# Patient Record
Sex: Male | Born: 1975 | Race: Black or African American | Hispanic: No | Smoking: Former smoker
Health system: Southern US, Community
[De-identification: ages and names within clinical notes are randomized; demographics above are authoritative.]

## PROBLEM LIST (undated history)

## (undated) DIAGNOSIS — I10 Essential (primary) hypertension: Secondary | ICD-10-CM

## (undated) DIAGNOSIS — F319 Bipolar disorder, unspecified: Secondary | ICD-10-CM

## (undated) DIAGNOSIS — J449 Chronic obstructive pulmonary disease, unspecified: Secondary | ICD-10-CM

## (undated) DIAGNOSIS — F209 Schizophrenia, unspecified: Secondary | ICD-10-CM

## (undated) DIAGNOSIS — E785 Hyperlipidemia, unspecified: Secondary | ICD-10-CM

## (undated) DIAGNOSIS — F329 Major depressive disorder, single episode, unspecified: Secondary | ICD-10-CM

## (undated) DIAGNOSIS — Z789 Other specified health status: Secondary | ICD-10-CM

## (undated) DIAGNOSIS — F419 Anxiety disorder, unspecified: Secondary | ICD-10-CM

## (undated) DIAGNOSIS — Z9189 Other specified personal risk factors, not elsewhere classified: Secondary | ICD-10-CM

## (undated) DIAGNOSIS — F32A Depression, unspecified: Secondary | ICD-10-CM

## (undated) HISTORY — DX: Chronic obstructive pulmonary disease, unspecified: J44.9

## (undated) HISTORY — DX: Bipolar disorder, unspecified: F31.9

## (undated) HISTORY — DX: Major depressive disorder, single episode, unspecified: F32.9

## (undated) HISTORY — DX: Other specified health status: Z78.9

## (undated) HISTORY — PX: OTHER SURGICAL HISTORY: SHX169

## (undated) HISTORY — DX: Anxiety disorder, unspecified: F41.9

## (undated) HISTORY — DX: Other specified personal risk factors, not elsewhere classified: Z91.89

## (undated) HISTORY — DX: Hyperlipidemia, unspecified: E78.5

## (undated) HISTORY — DX: Schizophrenia, unspecified: F20.9

## (undated) HISTORY — DX: Depression, unspecified: F32.A

---

## 1998-05-23 ENCOUNTER — Emergency Department (HOSPITAL_COMMUNITY): Admission: EM | Admit: 1998-05-23 | Discharge: 1998-05-23 | Payer: Self-pay | Admitting: Emergency Medicine

## 1999-07-07 ENCOUNTER — Emergency Department (HOSPITAL_COMMUNITY): Admission: EM | Admit: 1999-07-07 | Discharge: 1999-07-07 | Payer: Self-pay | Admitting: Emergency Medicine

## 1999-07-10 ENCOUNTER — Ambulatory Visit: Admission: RE | Admit: 1999-07-10 | Discharge: 1999-07-10 | Payer: Self-pay | Admitting: Emergency Medicine

## 2000-01-09 ENCOUNTER — Emergency Department (HOSPITAL_COMMUNITY): Admission: EM | Admit: 2000-01-09 | Discharge: 2000-01-09 | Payer: Self-pay

## 2003-03-16 ENCOUNTER — Encounter: Payer: Self-pay | Admitting: Internal Medicine

## 2003-03-16 ENCOUNTER — Ambulatory Visit (HOSPITAL_COMMUNITY): Admission: RE | Admit: 2003-03-16 | Discharge: 2003-03-16 | Payer: Self-pay | Admitting: Internal Medicine

## 2005-03-20 ENCOUNTER — Ambulatory Visit: Payer: Self-pay | Admitting: Internal Medicine

## 2005-05-11 ENCOUNTER — Emergency Department (HOSPITAL_COMMUNITY): Admission: EM | Admit: 2005-05-11 | Discharge: 2005-05-11 | Payer: Self-pay | Admitting: Emergency Medicine

## 2005-05-14 ENCOUNTER — Ambulatory Visit: Payer: Self-pay | Admitting: Internal Medicine

## 2005-05-16 ENCOUNTER — Ambulatory Visit: Payer: Self-pay | Admitting: Internal Medicine

## 2008-01-30 ENCOUNTER — Ambulatory Visit: Payer: Self-pay | Admitting: Internal Medicine

## 2008-01-30 LAB — CONVERTED CEMR LAB

## 2009-05-26 ENCOUNTER — Emergency Department (HOSPITAL_COMMUNITY): Admission: EM | Admit: 2009-05-26 | Discharge: 2009-05-26 | Payer: Self-pay | Admitting: Emergency Medicine

## 2009-06-11 ENCOUNTER — Emergency Department (HOSPITAL_COMMUNITY): Admission: EM | Admit: 2009-06-11 | Discharge: 2009-06-11 | Payer: Self-pay | Admitting: Emergency Medicine

## 2010-04-17 ENCOUNTER — Emergency Department (HOSPITAL_COMMUNITY): Admission: EM | Admit: 2010-04-17 | Discharge: 2010-04-17 | Payer: Self-pay | Admitting: Emergency Medicine

## 2010-09-03 ENCOUNTER — Emergency Department (HOSPITAL_COMMUNITY)
Admission: EM | Admit: 2010-09-03 | Discharge: 2010-09-07 | Payer: Self-pay | Source: Home / Self Care | Admitting: Emergency Medicine

## 2010-09-04 LAB — COMPREHENSIVE METABOLIC PANEL
ALT: 23 U/L (ref 0–53)
AST: 26 U/L (ref 0–37)
Albumin: 3.6 g/dL (ref 3.5–5.2)
Alkaline Phosphatase: 99 U/L (ref 39–117)
BUN: 8 mg/dL (ref 6–23)
CO2: 25 mEq/L (ref 19–32)
Calcium: 9.1 mg/dL (ref 8.4–10.5)
Chloride: 109 mEq/L (ref 96–112)
Creatinine, Ser: 1.24 mg/dL (ref 0.4–1.5)
GFR calc Af Amer: >60 mL/min (ref >60)
GFR calc non Af Amer: >60 mL/min (ref >60)
Glucose, Bld: 98 mg/dL (ref 70–99)
Potassium: 3.8 mEq/L (ref 3.5–5.1)
Sodium: 140 mEq/L (ref 135–145)
Total Bilirubin: 1.5 mg/dL — ABNORMAL HIGH (ref 0.3–1.2)
Total Protein: 7.1 g/dL (ref 6.0–8.3)

## 2010-09-04 LAB — LIPASE, BLOOD: Lipase: 27 U/L (ref 11–59)

## 2010-09-04 LAB — RAPID URINE DRUG SCREEN, HOSP PERFORMED
Amphetamines: NOT DETECTED
Barbiturates: NOT DETECTED
Benzodiazepines: NOT DETECTED
Cocaine: NOT DETECTED
Opiates: NOT DETECTED
Tetrahydrocannabinol: POSITIVE — AB

## 2010-09-04 LAB — CBC
HCT: 44.4 % (ref 39.0–52.0)
Hemoglobin: 15.3 g/dL (ref 13.0–17.0)
MCH: 28.4 pg (ref 26.0–34.0)
MCHC: 34.5 g/dL (ref 30.0–36.0)
MCV: 82.5 fL (ref 78.0–100.0)
Platelets: 202 10*3/uL (ref 150–400)
RBC: 5.38 MIL/uL (ref 4.22–5.81)
RDW: 14.2 % (ref 11.5–15.5)
WBC: 11.1 10*3/uL — ABNORMAL HIGH (ref 4.0–10.5)

## 2010-09-04 LAB — IRON: Iron: 91 ug/dL (ref 42–135)

## 2010-09-04 LAB — URINE MICROSCOPIC-ADD ON

## 2010-09-04 LAB — GLUCOSE, CAPILLARY: Glucose-Capillary: 95 mg/dL (ref 70–99)

## 2010-09-04 LAB — URINALYSIS, ROUTINE W REFLEX MICROSCOPIC
Bilirubin Urine: NEGATIVE
Ketones, ur: NEGATIVE mg/dL
Leukocytes, UA: NEGATIVE
Nitrite: NEGATIVE
Protein, ur: NEGATIVE mg/dL
Specific Gravity, Urine: 1.023 (ref 1.005–1.030)
Urine Glucose, Fasting: NEGATIVE mg/dL
Urobilinogen, UA: 1 mg/dL (ref 0.0–1.0)
pH: 6 (ref 5.0–8.0)

## 2010-09-04 LAB — DIFFERENTIAL
Basophils Absolute: 0 10*3/uL (ref 0.0–0.1)
Basophils Relative: 0 % (ref 0–1)
Eosinophils Absolute: 0.1 10*3/uL (ref 0.0–0.7)
Eosinophils Relative: 1 % (ref 0–5)
Lymphocytes Relative: 26 % (ref 12–46)
Lymphs Abs: 2.9 10*3/uL (ref 0.7–4.0)
Monocytes Absolute: 0.8 10*3/uL (ref 0.1–1.0)
Monocytes Relative: 7 % (ref 3–12)
Neutro Abs: 7.3 10*3/uL (ref 1.7–7.7)
Neutrophils Relative %: 66 % (ref 43–77)

## 2010-09-04 LAB — SALICYLATE LEVEL: Salicylate Lvl: <4 mg/dL (ref 2.8–20.0)

## 2010-09-04 LAB — ACETAMINOPHEN LEVEL: Acetaminophen (Tylenol), Serum: <10 ug/mL — ABNORMAL LOW (ref 10–30)

## 2010-09-04 LAB — ETHANOL: Alcohol, Ethyl (B): <5 mg/dL (ref 0–10)

## 2010-09-09 ENCOUNTER — Encounter: Payer: Self-pay | Admitting: Internal Medicine

## 2011-03-01 ENCOUNTER — Inpatient Hospital Stay (INDEPENDENT_AMBULATORY_CARE_PROVIDER_SITE_OTHER)
Admission: RE | Admit: 2011-03-01 | Discharge: 2011-03-01 | Disposition: A | Discharge status: Home / Self Care | Payer: Self-pay | Source: Ambulatory Visit | Attending: Family Medicine | Admitting: Family Medicine

## 2011-03-01 DIAGNOSIS — H109 Unspecified conjunctivitis: Secondary | ICD-10-CM

## 2011-05-22 ENCOUNTER — Inpatient Hospital Stay (INDEPENDENT_AMBULATORY_CARE_PROVIDER_SITE_OTHER)
Admission: RE | Admit: 2011-05-22 | Discharge: 2011-05-22 | Disposition: A | Discharge status: Home / Self Care | Payer: Self-pay | Source: Ambulatory Visit | Attending: Family Medicine | Admitting: Family Medicine

## 2011-05-22 DIAGNOSIS — R51 Headache: Secondary | ICD-10-CM

## 2011-12-12 ENCOUNTER — Emergency Department (INDEPENDENT_AMBULATORY_CARE_PROVIDER_SITE_OTHER)
Admission: EM | Admit: 2011-12-12 | Discharge: 2011-12-12 | Disposition: A | Discharge status: Home / Self Care | Payer: Self-pay | Source: Home / Self Care | Attending: Emergency Medicine | Admitting: Emergency Medicine

## 2011-12-12 ENCOUNTER — Encounter (HOSPITAL_COMMUNITY): Payer: Self-pay

## 2011-12-12 DIAGNOSIS — L02828 Furuncle of other sites: Secondary | ICD-10-CM

## 2011-12-12 DIAGNOSIS — L02429 Furuncle of limb, unspecified: Secondary | ICD-10-CM

## 2011-12-12 MED ORDER — DOXYCYCLINE HYCLATE 100 MG PO CAPS: 100.0000 mg | ORAL_CAPSULE | Freq: Two times a day (BID) | ORAL | Status: AC

## 2011-12-12 NOTE — ED Provider Notes (Signed)
History     CSN: [id number]  Arrival date & time 12/12/11  7829   First MD Initiated Contact with Patient 12/12/11 [phone]      Chief Complaint  Patient presents with  . Recurrent Skin Infections    (Consider location/radiation/quality/duration/timing/severity/associated sxs/prior treatment) HPI Comments: Started very small 7 days like a pimple and got bigger, swollen and tender, and drained some pus while  i was in the shower and started oozing pus, made feel nauseous and made me feel dizzy, its less swollen today"  The history is provided by the patient.    History reviewed. No pertinent past medical history.  History reviewed. No pertinent past surgical history.  History reviewed. No pertinent family history.  History  Substance Use Topics  . Smoking status: Not on file  . Smokeless tobacco: Not on file  . Alcohol Use: Not on file      Review of Systems  Constitutional: Negative for fever, activity change and fatigue.  Skin: Positive for wound.  Neurological: Negative for weakness and numbness.    Allergies  Review of patient's allergies indicates no known allergies.  Home Medications   Current Outpatient Rx  Name Route Sig Dispense Refill  . DOXYCYCLINE HYCLATE 100 MG PO CAPS Oral Take 1 capsule (100 mg total) by mouth 2 (two) times daily. 20 capsule 0    BP 150/88  Pulse 74  Temp(Src) 98.8 F (37.1 C) (Oral)  Resp 18  SpO2 100%  Physical Exam  Nursing note and vitals reviewed. Constitutional: He appears well-developed and well-nourished.  Musculoskeletal: He exhibits tenderness.       Arms: Skin: Rash noted.    ED Course  INCISION AND DRAINAGE Performed by: Celicia Minahan Authorized by: Jimmie Molly Consent: Verbal consent obtained. Consent given by: patient Patient understanding: patient states understanding of the procedure being performed Patient identity confirmed: verbally with patient Type: cyst Body area: upper extremity Location  details: left elbow Local anesthetic: NaHCO3 (sodium bicarbonate) Patient sedated: no Scalpel size: 15 Drainage amount: scant Wound treatment: wound left open Patient tolerance: Patient tolerated the procedure well with no immediate complications.   (including critical care time)   Labs Reviewed  CULTURE, ROUTINE-ABSCESS   No results found.   1. Furuncle of extremity       MDM  Outer aspect left arm furuncle with mild cellulitis I & D        Jimmie Molly, MD 12/12/11 [phone]

## 2011-12-12 NOTE — ED Notes (Signed)
C/o 7 day duration of pain, swelling left arm, drained some in shower this AM; c/o made him feel sick

## 2011-12-12 NOTE — Discharge Instructions (Signed)
Take antibiotic as described. Return if further concerns or area become much better up to 3 to 5 days   Abscess Care After An abscess (also called a boil or furuncle) is an infected area that contains a collection of pus. Signs and symptoms of an abscess include pain, tenderness, redness, or hardness, or you may feel a moveable soft area under your skin. An abscess can occur anywhere in the body. The infection may spread to surrounding tissues causing cellulitis. A cut (incision) by the surgeon was made over your abscess and the pus was drained out. Gauze may have been packed into the space to provide a drain that will allow the cavity to heal from the inside outwards. The boil may be painful for 5 to 7 days. Most people with a boil do not have high fevers. Your abscess, if seen early, may not have localized, and may not have been lanced. If not, another appointment may be required for this if it does not get better on its own or with medications. HOME CARE INSTRUCTIONS   Only take over-the-counter or prescription medicines for pain, discomfort, or fever as directed by your caregiver.   When you bathe, soak and then remove gauze or iodoform packs at least daily or as directed by your caregiver. You may then wash the wound gently with mild soapy water. Repack with gauze or do as your caregiver directs.  SEEK IMMEDIATE MEDICAL CARE IF:   You develop increased pain, swelling, redness, drainage, or bleeding in the wound site.   You develop signs of generalized infection including muscle aches, chills, fever, or a general ill feeling.   An oral temperature above 102 F (38.9 C) develops, not controlled by medication.  See your caregiver for a recheck if you develop any of the symptoms described above. If medications (antibiotics) were prescribed, take them as directed. Document Released: 02/22/2005 Document Revised: 07/26/2011 Document Reviewed: 10/20/2007 [facility] Patient Information 2012  [place], [place].

## 2011-12-12 NOTE — ED Notes (Signed)
Call back number for lab issues verified 

## 2011-12-15 LAB — CULTURE, ROUTINE-ABSCESS: Gram Stain: NONE SEEN

## 2011-12-15 NOTE — ED Notes (Signed)
[facility] called and can't afford doxy and septra ds # 20 one bid per Dr Artis Flock. THH

## 2011-12-17 NOTE — ED Notes (Signed)
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  Adequately treated with Septra DS (Rx called in to pharmacy post visit per notes by Dr. Artis Flock).

## 2013-08-04 ENCOUNTER — Encounter (HOSPITAL_COMMUNITY): Payer: Self-pay | Admitting: Emergency Medicine

## 2013-08-04 ENCOUNTER — Emergency Department (INDEPENDENT_AMBULATORY_CARE_PROVIDER_SITE_OTHER)
Admission: EM | Admit: 2013-08-04 | Discharge: 2013-08-04 | Disposition: A | Discharge status: Home / Self Care | Payer: Medicaid Other | Source: Home / Self Care | Attending: Family Medicine | Admitting: Family Medicine

## 2013-08-04 DIAGNOSIS — K047 Periapical abscess without sinus: Secondary | ICD-10-CM

## 2013-08-04 DIAGNOSIS — B351 Tinea unguium: Secondary | ICD-10-CM

## 2013-08-04 DIAGNOSIS — B353 Tinea pedis: Secondary | ICD-10-CM

## 2013-08-04 MED ORDER — CLINDAMYCIN HCL 300 MG PO CAPS: 300.0000 mg | ORAL_CAPSULE | Freq: Three times a day (TID) | ORAL | Status: DC

## 2013-08-04 MED ORDER — TERBINAFINE HCL 250 MG PO TABS: 250.0000 mg | ORAL_TABLET | Freq: Every day | ORAL | Status: AC

## 2013-08-04 NOTE — ED Provider Notes (Signed)
Bruce [name] is a 37 y.o. male who presents to Urgent Care today for rash and mouth lesion.   # Rash - He reports feet have intermittently had tiny blisters with clear exudate for "years." He notes some hyperpigmented scars on his feet and discolored toenails. He has had worsening itching the past 2 days. His girlfriend is worried about it and asked him to come in. He denies fevers or chills. No one in the household has a similar rash. Denies new shoes, products, medicines, or lotions but reports that whenever he tries on other people's shoes, he breaks out.  # Mouth lesion - 5 days ago, patient developed a mouth lesion that was painless but started oozing pus that tasted funny. He looked in his mouth and saw a knot at the roof of mouth leaking pus and blood. He has also been having diarrhea and slight nausea for 5 days and chills. He denies fevers or other rash. He does not report other symptoms. He thinks his friend's girlfriend has had an ear infection recently.  PMH: No PCP Reports no major medical issues Up to date on childhood immunizations. Reports no regular medications. NKDA   History reviewed. No pertinent past medical history. History  Substance Use Topics  . Smoking status: Current Every Day Smoker -- 1.00 packs/day    Types: Cigarettes  . Smokeless tobacco: Not on file  . Alcohol Use: Not on file  Smokes 1 ppd x 17 years. Reports no drugs. EtOh once monthly  ROS as above Medications reviewed. No current facility-administered medications for this encounter.   No current outpatient prescriptions on file.    Exam:  BP 129/75  Pulse 78  Temp(Src) 98.5 F (36.9 C) (Oral)  Resp 16  SpO2 98% Gen: Well NAD, pleasant HEENT: EOMI,  MMM, sclera clear, o/p clear with very poor dentition, upper back molars decayed to gumline with no surrounding erythema, roof of mouth at border with upper front teeth with 3cm almost drained abscess with minimal purulent drainage with  massage, no tenderness or erythema Skin: Feet with slight amount of peeling between toes, mild dryness and peeling bilateral lateral foot, no erythema Lungs: Normal work of breathing.   No results found for this or any previous visit (from the past 24 hour(s)). No results found.  Assessment and Plan: 37 y.o. male with abscess at roof of mouth and likely tinea pedis, possibly vesiculobullous form.  # Abscess roof of mouth - Patient is afebrile and abscess already draining. - Clindamycin TID x 10 days with 1 refill if symptoms not improving. - Warm salt water and compression - Strongly encouraged follow up with dentist, provided list covered by Medicaid - Return precautions reviewed.  # Tinea pedis, possible vesicobullous form - Lamisil PO 250mg  daily x 2 weeks for tinea pedis which may also help onychomycosis. - F/u with PCP for further tx - Info on foot centers given    Leona Singleton, MD 08/04/13 [phone]

## 2013-08-04 NOTE — ED Provider Notes (Signed)
Medical screening examination/treatment/procedure(s) were performed by resident physician or non-physician practitioner and as supervising physician I was immediately available for consultation/collaboration.   Kyannah Climer DOUGLAS MD.   Ren Aspinall D Makenzy Krist, MD 08/04/13 2013 

## 2013-08-04 NOTE — ED Notes (Signed)
Pt c/o intermittent rash on both feet onset 1.5 yrs... States they blister up and pop, draining clear fluid... They will leave a dark discoloration after popping Also c/o an abscess roof of mouth/palette onset 1 week... Draining blood and white matter???... Has also been feeling nauseas and having diarrhea Denies: f/v... He is alert w/no signs of acute distress.

## 2014-06-02 ENCOUNTER — Emergency Department (HOSPITAL_COMMUNITY)
Admission: EM | Admit: 2014-06-02 | Discharge: 2014-06-02 | Disposition: A | Discharge status: Home / Self Care | Payer: Medicaid Other | Attending: Emergency Medicine | Admitting: Emergency Medicine

## 2014-06-02 ENCOUNTER — Encounter (HOSPITAL_COMMUNITY): Payer: Self-pay | Admitting: Emergency Medicine

## 2014-06-02 DIAGNOSIS — Z792 Long term (current) use of antibiotics: Secondary | ICD-10-CM | POA: Insufficient documentation

## 2014-06-02 DIAGNOSIS — K029 Dental caries, unspecified: Secondary | ICD-10-CM | POA: Diagnosis not present

## 2014-06-02 DIAGNOSIS — Z720 Tobacco use: Secondary | ICD-10-CM | POA: Diagnosis not present

## 2014-06-02 DIAGNOSIS — K088 Other specified disorders of teeth and supporting structures: Secondary | ICD-10-CM | POA: Diagnosis present

## 2014-06-02 DIAGNOSIS — K047 Periapical abscess without sinus: Secondary | ICD-10-CM | POA: Diagnosis not present

## 2014-06-02 MED ORDER — HYDROCODONE-ACETAMINOPHEN 5-325 MG PO TABS: 1.0000 | ORAL_TABLET | Freq: Four times a day (QID) | ORAL | Status: DC | PRN

## 2014-06-02 MED ORDER — PENICILLIN V POTASSIUM 500 MG PO TABS: 500.0000 mg | ORAL_TABLET | Freq: Four times a day (QID) | ORAL | Status: DC

## 2014-06-02 MED ORDER — IBUPROFEN 800 MG PO TABS: 800.0000 mg | ORAL_TABLET | Freq: Three times a day (TID) | ORAL | Status: DC | PRN

## 2014-06-02 NOTE — ED Notes (Signed)
Pt reports having a knot on top of mouth, causing pain to lip and front teeth. Airway intact.

## 2014-06-02 NOTE — Discharge Instructions (Signed)
Return here as needed. Follow up with the dentist provided. °

## 2014-06-02 NOTE — ED Provider Notes (Signed)
CSN: [id number]     Arrival date & time 06/02/14  1226 History  This chart was scribed for non-physician practitioner, Ebbie Ridgehris Helem Reesor, PA-C working with Elwin MochaBlair Walden, MD by Greggory StallionKayla Andersen, ED scribe. This patient was seen in room TR05C/TR05C and the patient's care was started at 1:21 PM.    Chief Complaint  Patient presents with  . Dental Pain   The history is provided by the patient. No language interpreter was used.   HPI Comments: Bruce [name] is a 38 y.o. male who presents to the Emergency Department complaining of right front dental pain that started about one month ago. States he started noticing a knot to the roof of his mouth. Reports some drainage from the area. Pt does not have a dentist.   History reviewed. No pertinent past medical history. History reviewed. No pertinent past surgical history. History reviewed. No pertinent family history. History  Substance Use Topics  . Smoking status: Current Every Day Smoker -- 1.00 packs/day    Types: Cigarettes  . Smokeless tobacco: Not on file  . Alcohol Use: Not on file    Review of Systems  All other systems negative except as documented in the HPI. All pertinent positives and negatives as reviewed in the HPI.  Allergies  Review of patient's allergies indicates no known allergies.  Home Medications   Prior to Admission medications   Medication Sig Start Date End Date Taking? Authorizing Provider  clindamycin (CLEOCIN) 300 MG capsule Take 1 capsule (300 mg total) by mouth 3 (three) times daily. 08/04/13   Leona SingletonMaria T Thekkekandam, MD   BP 138/76  Pulse 78  Temp(Src) 98.2 F (36.8 C) (Oral)  Resp 18  Ht 6\' 5"  (1.956 m)  Wt 240 lb (108.863 kg)  BMI 28.45 kg/m2  SpO2 98%  Physical Exam  Nursing note and vitals reviewed. Constitutional: He is oriented to person, place, and time. He appears well-developed and well-nourished. No distress.  HENT:  Head: Normocephalic and atraumatic.  Multiple areas of dental decay. Swollen  area to soft palate medial to right upper lateral incisor.   Eyes: Conjunctivae and EOM are normal.  Neck: Neck supple. No tracheal deviation present.  Cardiovascular: Normal rate.   Pulmonary/Chest: Effort normal. No respiratory distress.  Musculoskeletal: Normal range of motion.  Neurological: He is alert and oriented to person, place, and time.  Skin: Skin is warm and dry.  Psychiatric: He has a normal mood and affect. His behavior is normal.    ED Course  Procedures (including critical care time)  DIAGNOSTIC STUDIES: Oxygen Saturation is 98% on RA, normal by my interpretation.    COORDINATION OF CARE: 1:22 PM-Discussed treatment plan which includes an antibiotic and warm water gargles with pt at bedside and pt agreed to plan. Will give pt dental referrals and advised him to follow up.     I personally performed the services described in this documentation, which was scribed in my presence. The recorded information has been reviewed and is accurate.  Carlyle Dollyhristopher W Gennie Dib, PA-C 06/03/14 1552

## 2014-06-02 NOTE — ED Notes (Signed)
C/O right upper gum pain. Hard pallet red, swollen and painful. Onset 1 month ago.

## 2014-06-04 NOTE — ED Provider Notes (Signed)
Medical screening examination/treatment/procedure(s) were performed by non-physician practitioner and as supervising physician I was immediately available for consultation/collaboration.   EKG Interpretation None        Bruce MochaBlair Metztli Sachdev, MD 06/04/14 [phone]

## 2015-12-17 ENCOUNTER — Emergency Department (HOSPITAL_COMMUNITY)
Admission: EM | Admit: 2015-12-17 | Discharge: 2015-12-17 | Disposition: A | Discharge status: Home / Self Care | Payer: Medicaid Other | Attending: Emergency Medicine | Admitting: Emergency Medicine

## 2015-12-17 ENCOUNTER — Encounter (HOSPITAL_COMMUNITY): Payer: Self-pay

## 2015-12-17 DIAGNOSIS — Z87891 Personal history of nicotine dependence: Secondary | ICD-10-CM | POA: Diagnosis not present

## 2015-12-17 DIAGNOSIS — G5601 Carpal tunnel syndrome, right upper limb: Secondary | ICD-10-CM

## 2015-12-17 DIAGNOSIS — M5431 Sciatica, right side: Secondary | ICD-10-CM | POA: Insufficient documentation

## 2015-12-17 DIAGNOSIS — R200 Anesthesia of skin: Secondary | ICD-10-CM | POA: Diagnosis present

## 2015-12-17 DIAGNOSIS — F1210 Cannabis abuse, uncomplicated: Secondary | ICD-10-CM | POA: Insufficient documentation

## 2015-12-17 LAB — CBC WITH DIFFERENTIAL/PLATELET
BASOS ABS: 0 10*3/uL (ref 0.0–0.1)
Basophils Relative: 0 %
Eosinophils Absolute: 0.1 10*3/uL (ref 0.0–0.7)
Eosinophils Relative: 2 %
HEMATOCRIT: 43.1 % (ref 39.0–52.0)
HEMOGLOBIN: 14.3 g/dL (ref 13.0–17.0)
LYMPHS ABS: 4.1 10*3/uL — AB (ref 0.7–4.0)
LYMPHS PCT: 50 %
MCH: 27.9 pg (ref 26.0–34.0)
MCHC: 33.2 g/dL (ref 30.0–36.0)
MCV: 84.2 fL (ref 78.0–100.0)
Monocytes Absolute: 0.7 10*3/uL (ref 0.1–1.0)
Monocytes Relative: 8 %
NEUTROS ABS: 3.3 10*3/uL (ref 1.7–7.7)
NEUTROS PCT: 40 %
PLATELETS: 235 10*3/uL (ref 150–400)
RBC: 5.12 MIL/uL (ref 4.22–5.81)
RDW: 14.2 % (ref 11.5–15.5)
WBC: 8.2 10*3/uL (ref 4.0–10.5)

## 2015-12-17 LAB — COMPREHENSIVE METABOLIC PANEL
ALT: 36 U/L (ref 17–63)
AST: 37 U/L (ref 15–41)
Albumin: 3.5 g/dL (ref 3.5–5.0)
Alkaline Phosphatase: 87 U/L (ref 38–126)
Anion gap: 10 (ref 5–15)
BILIRUBIN TOTAL: 1.2 mg/dL (ref 0.3–1.2)
BUN: 10 mg/dL (ref 6–20)
CALCIUM: 8.8 mg/dL — AB (ref 8.9–10.3)
CHLORIDE: 104 mmol/L (ref 101–111)
CO2: 21 mmol/L — ABNORMAL LOW (ref 22–32)
CREATININE: 1.31 mg/dL — AB (ref 0.61–1.24)
GFR calc Af Amer: >60 mL/min (ref >60)
Glucose, Bld: 98 mg/dL (ref 65–99)
Potassium: 4.3 mmol/L (ref 3.5–5.1)
Sodium: 135 mmol/L (ref 135–145)
Total Protein: 6.8 g/dL (ref 6.5–8.1)

## 2015-12-17 LAB — RAPID URINE DRUG SCREEN, HOSP PERFORMED
Amphetamines: NOT DETECTED
Barbiturates: NOT DETECTED
Benzodiazepines: NOT DETECTED
Cocaine: NOT DETECTED
OPIATES: NOT DETECTED
TETRAHYDROCANNABINOL: POSITIVE — AB

## 2015-12-17 MED ORDER — NAPROXEN 500 MG PO TABS: 500.0000 mg | ORAL_TABLET | Freq: Two times a day (BID) | ORAL | Status: DC

## 2015-12-17 MED ORDER — KETOROLAC TROMETHAMINE 30 MG/ML IJ SOLN
30.0000 mg | Freq: Once | INTRAMUSCULAR | Status: AC
  Administered 2015-12-17: 30 mg via INTRAMUSCULAR
  Filled 2015-12-17: qty 1

## 2015-12-17 MED ORDER — IBUPROFEN 800 MG PO TABS
800.0000 mg | ORAL_TABLET | Freq: Once | ORAL | Status: AC
  Administered 2015-12-17: 800 mg via ORAL
  Filled 2015-12-17: qty 1

## 2015-12-17 MED ORDER — DEXAMETHASONE SODIUM PHOSPHATE 10 MG/ML IJ SOLN
10.0000 mg | Freq: Once | INTRAMUSCULAR | Status: AC
  Administered 2015-12-17: 10 mg via INTRAMUSCULAR
  Filled 2015-12-17: qty 1

## 2015-12-17 NOTE — ED Notes (Signed)
Ortho device needed not in [employer] room; paged ortho tech and he will bring to room.

## 2015-12-17 NOTE — Discharge Instructions (Signed)
Carpal Tunnel Syndrome Carpal tunnel syndrome is a condition that causes pain in your hand and arm. The carpal tunnel is a narrow area located on the palm side of your wrist. Repeated wrist motion or certain diseases may cause swelling within the tunnel. This swelling pinches the main nerve in the wrist (median nerve). CAUSES  This condition may be caused by:   Repeated wrist motions.  Wrist injuries.  Arthritis.  A cyst or tumor in the carpal tunnel.  Fluid buildup during pregnancy. Sometimes the cause of this condition is not known.  RISK FACTORS This condition is more likely to develop in:   People who have jobs that cause them to repeatedly move their wrists in the same motion, such as butchers and cashiers.  Women.  People with certain conditions, such as:  Diabetes.  Obesity.  An underactive thyroid (hypothyroidism).  Kidney failure. SYMPTOMS  Symptoms of this condition include:   A tingling feeling in your fingers, especially in your thumb, index, and middle fingers.  Tingling or numbness in your hand.  An aching feeling in your entire arm, especially when your wrist and elbow are bent for long periods of time.  Wrist pain that goes up your arm to your shoulder.  Pain that goes down into your palm or fingers.  A weak feeling in your hands. You may have trouble grabbing and holding items. Your symptoms may feel worse during the night.  DIAGNOSIS  This condition is diagnosed with a medical history and physical exam. You may also have tests, including:   An electromyogram (EMG). This test measures electrical signals sent by your nerves into the muscles.  X-rays. TREATMENT  Treatment for this condition includes:  Lifestyle changes. It is important to stop doing or modify the activity that caused your condition.  Physical or occupational therapy.  Medicines for pain and inflammation. This may include medicine that is injected into your wrist.  A wrist  splint.  Surgery. HOME CARE INSTRUCTIONS  If You Have a Splint:  Wear it as told by your health care provider. Remove it only as told by your health care provider.  Loosen the splint if your fingers become numb and tingle, or if they turn cold and blue.  Keep the splint clean and dry. General Instructions  Take over-the-counter and prescription medicines only as told by your health care provider.  Rest your wrist from any activity that may be causing your pain. If your condition is work related, talk to your employer about changes that can be made, such as getting a wrist pad to use while typing.  If directed, apply ice to the painful area:  Put ice in a plastic bag.  Place a towel between your skin and the bag.  Leave the ice on for 20 minutes, 2-3 times per day.  Keep all follow-up visits as told by your health care provider. This is important.  Do any exercises as told by your health care provider, physical therapist, or occupational therapist. SEEK MEDICAL CARE IF:   You have new symptoms.  Your pain is not controlled with medicines.  Your symptoms get worse.   This information is not intended to replace advice given to you by your health care provider. Make sure you discuss any questions you have with your health care provider.   Document Released: 08/03/2000 Document Revised: 04/27/2015 Document Reviewed: 12/22/2014 Elsevier Interactive Patient Education 2016 [employer] Inc[employer]  Sciatica Sciatica is pain, weakness, numbness, or tingling along the path of  the sciatic nerve. The nerve starts in the lower back and runs down the back of each leg. The nerve controls the muscles in the lower leg and in the back of the knee, while also providing sensation to the back of the thigh, lower leg, and the sole of your foot. Sciatica is a symptom of another medical condition. For instance, nerve damage or certain conditions, such as a herniated disk or bone spur on the spine, pinch or  put pressure on the sciatic nerve. This causes the pain, weakness, or other sensations normally associated with sciatica. Generally, sciatica only affects one side of the body. CAUSES   Herniated or slipped disc.  Degenerative disk disease.  A pain disorder involving the narrow muscle in the buttocks (piriformis syndrome).  Pelvic injury or fracture.  Pregnancy.  Tumor (rare). SYMPTOMS  Symptoms can vary from mild to very severe. The symptoms usually travel from the low back to the buttocks and down the back of the leg. Symptoms can include:  Mild tingling or dull aches in the lower back, leg, or hip.  Numbness in the back of the calf or sole of the foot.  Burning sensations in the lower back, leg, or hip.  Sharp pains in the lower back, leg, or hip.  Leg weakness.  Severe back pain inhibiting movement. These symptoms may get worse with coughing, sneezing, laughing, or prolonged sitting or standing. Also, being overweight may worsen symptoms. DIAGNOSIS  Your caregiver will perform a physical exam to look for common symptoms of sciatica. He or she may ask you to do certain movements or activities that would trigger sciatic nerve pain. Other tests may be performed to find the cause of the sciatica. These may include:  Blood tests.  X-rays.  Imaging tests, such as an MRI or CT scan. TREATMENT  Treatment is directed at the cause of the sciatic pain. Sometimes, treatment is not necessary and the pain and discomfort goes away on its own. If treatment is needed, your caregiver may suggest:  Over-the-counter medicines to relieve pain.  Prescription medicines, such as anti-inflammatory medicine, muscle relaxants, or narcotics.  Applying heat or ice to the painful area.  Steroid injections to lessen pain, irritation, and inflammation around the nerve.  Reducing activity during periods of pain.  Exercising and stretching to strengthen your abdomen and improve flexibility of  your spine. Your caregiver may suggest losing weight if the extra weight makes the back pain worse.  Physical therapy.  Surgery to eliminate what is pressing or pinching the nerve, such as a bone spur or part of a herniated disk. HOME CARE INSTRUCTIONS   Only take over-the-counter or prescription medicines for pain or discomfort as directed by your caregiver.  Apply ice to the affected area for 20 minutes, 3-4 times a day for the first 48-72 hours. Then try heat in the same way.  Exercise, stretch, or perform your usual activities if these do not aggravate your pain.  Attend physical therapy sessions as directed by your caregiver.  Keep all follow-up appointments as directed by your caregiver.  Do not wear high heels or shoes that do not provide proper support.  Check your mattress to see if it is too soft. A firm mattress may lessen your pain and discomfort. SEEK IMMEDIATE MEDICAL CARE IF:   You lose control of your bowel or bladder (incontinence).  You have increasing weakness in the lower back, pelvis, buttocks, or legs.  You have redness or swelling of your back.  You have a burning sensation when you urinate.  You have pain that gets worse when you lie down or awakens you at night.  Your pain is worse than you have experienced in the past.  Your pain is lasting longer than 4 weeks.  You are suddenly losing weight without reason. MAKE SURE YOU:  Understand these instructions.  Will watch your condition.  Will get help right away if you are not doing well or get worse.   This information is not intended to replace advice given to you by your health care provider. Make sure you discuss any questions you have with your health care provider.   Document Released: 07/31/2001 Document Revised: 04/27/2015 Document Reviewed: 12/16/2011 Elsevier Interactive Patient Education [employer]

## 2015-12-17 NOTE — ED Notes (Signed)
Patient hre with right arm and leg numbness intermittently x  Month. Denies trauma

## 2015-12-17 NOTE — Progress Notes (Signed)
Orthopedic Tech Progress Note Patient Details:  Bruce RuedRaymond [name] [birth year] [id number]  Ortho Devices Type of Ortho Device: Thumb velcro splint Ortho Device/Splint Location: rue Ortho Device/Splint Interventions: Application   Nikki DomCrawford, Leshawn Houseworth 12/17/2015, 12:49 PM

## 2015-12-17 NOTE — ED Provider Notes (Signed)
CSN: [id number]     Arrival date & time 12/17/15  [id number] History   First MD Initiated Contact with Patient 12/17/15 1046     Chief Complaint  Patient presents with  . arm/leg numbness      (Consider location/radiation/quality/duration/timing/severity/associated sxs/prior Treatment) HPI  Bruce [name] is a 40 y.o. male with no significant PMH significant who presents with right hand pain and right leg pain x 1 month.  Patient reports intermittent, worse at night and while driving, right wrist/hand pain he describes as "pins and needles" that occasionally radiates to the right forearm.  He states he feels like he just need to shake it out.  It lasts a couple of hours.  No relieving factors.  He has tried tylenol with minimal relief.  He also reports posterior right leg pain x 1 month.  It does not radiate to the calf or toes.  No leg swelling or color change.  No bowel/bladder incontinence, fever, abdominal pain, N/V/D, urinary symptoms, CP, or SOB.  He is ambulatory without difficulty.  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  Denies injury/trauma.  No IVDU.  No active malignancy.  He denies any known medical problems.    History reviewed. No pertinent past medical history. History reviewed. No pertinent past surgical history. No family history on file. Social History  Substance Use Topics  . Smoking status: Former Smoker -- 1.00 packs/day    Types: Cigarettes  . Smokeless tobacco: None  . Alcohol Use: None    Review of Systems All other systems negative unless otherwise stated in HPI   Allergies  Review of patient's allergies indicates no known allergies.  Home Medications   Prior to Admission medications   Medication Sig Start Date End Date Taking? Authorizing Provider  acetaminophen (TYLENOL) 500 MG tablet Take 500 mg by  mouth every 6 (six) hours as needed (pain).   Yes Historical Provider, MD   BP 127/77 mmHg  Pulse 68  Temp(Src) 98.1 F (36.7 C) (Oral)  Resp 14  SpO2 100% Physical Exam  Constitutional: He is oriented to person, place, and time. He appears well-developed and well-nourished.  Non-toxic appearance. He does not have a sickly appearance. He does not appear ill.  HENT:  Head: Normocephalic and atraumatic.  Mouth/Throat: Oropharynx is clear and moist.  Eyes: Conjunctivae are normal.  Neck: Normal range of motion. Neck supple.  No cervical midline tenderness.   Cardiovascular: Normal rate, regular rhythm, normal heart sounds and intact distal pulses.   No murmur heard. Pulses:      Radial pulses are 2+ on the right side, and 2+ on the left side.       Dorsalis pedis pulses are 2+ on the right side, and 2+ on the left side.  Capillary refill less than 3 seconds x 5 in RUE.   Pulmonary/Chest: Effort normal and breath sounds normal. No accessory muscle usage or stridor. No respiratory distress. He has no wheezes. He has no rhonchi. He has no rales.  Abdominal: Soft. Bowel sounds are normal. He exhibits no distension. There is no tenderness. There is no rebound and no guarding.  Musculoskeletal: Normal range of motion. He exhibits tenderness.       Right wrist: He exhibits normal range of motion, no tenderness, no bony tenderness, no swelling, no crepitus and no deformity.  Lumbar back: He exhibits tenderness and pain. He exhibits normal range of motion, no bony tenderness, no swelling, no deformity, no spasm and normal pulse.       Back:  No spinous process tenderness.  No step offs. No crepitus.  No c/t/l/s midline tenderness.  TTP along sciatic notch with reproducible pain.   Right wrist: no obvious deformity, swelling, erythema, or warmth.  Non-tender TTP.  FAROM in flexion and extension without pain.  Positive Tinel sign. Sensation intact to light touch.   Lymphadenopathy:    He has  no cervical adenopathy.  Neurological: He is alert and oriented to person, place, and time.  Strength and sensation intact bilaterally throughout upper and lower extremities.  Gait normal. No foot drop.  + SLR on the right  Skin: Skin is warm and dry.  No erythema, induration, fluctuance, or warmth.    Psychiatric: He has a normal mood and affect. His behavior is normal.    ED Course  Procedures (including critical care time) Labs Review Labs Reviewed  CBC WITH DIFFERENTIAL/PLATELET - Abnormal; Notable for the following:    Lymphs Abs 4.1 (*)    All other components within normal limits  COMPREHENSIVE METABOLIC PANEL - Abnormal; Notable for the following:    CO2 21 (*)    Creatinine, Ser 1.31 (*)    Calcium 8.8 (*)    All other components within normal limits  URINE RAPID DRUG SCREEN, HOSP PERFORMED - Abnormal; Notable for the following:    Tetrahydrocannabinol POSITIVE (*)    All other components within normal limits    Imaging Review No results found. I have personally reviewed and evaluated these images and lab results as part of my medical decision-making.   EKG Interpretation   Date/Time:  Saturday December 17 2015 10:20:20 EDT Ventricular Rate:  73 PR Interval:  199 QRS Duration: 74 QT Interval:  376 QTC Calculation: 414 R Axis:   79 Text Interpretation:  Sinus rhythm Minimal ST elevation, lateral leads No  significant change was found Confirmed by Manus Gunning  MD, STEPHEN ([id number]) on  12/17/2015 10:39:47 AM      MDM   Final diagnoses:  Carpal tunnel syndrome of right wrist  Sciatica of right side   Patient presents with 2 separate complaints today. Patient complains of right hand/wrist pain he describes as "pins and needles "that is worse at night and while driving. No weakness. On exam, neurovascularly intact. No focal neurological deficits. No overlying signs of infection. Patient also complains of posterior right upper leg pain. He is ambulatory. No focal  weakness. No bowel or bladder incontinence, fever, abdominal pain, urinary symptoms, history of IV drug use, or active malignancy. On exam, pain is reproducible to palpation along sciatic notch. Positive straight leg raise on the right. Neurovascularly intact. No focal neurological deficits. Patient ambulatory without difficulty. Labs show UDS positive for UDS.  Cr 1.3, comparison 1 year ago 1.24.  His symptoms are consistent with carpal tunnel syndrome and sciatica.  Doubt CVA or intracranial etiology. Patient given Decadron and Toradol in ED with improvement of symptoms.  Plan to discharge home with wrist splint and Naproxen.  Follow up with orthopedics.  Discussed return precautions.  Patient agrees and acknowledges the above plan for discharge.   Case has been discussed with and seen by Dr. Manus Gunning who agrees with the above plan for discharge.     Cheri Fowler, PA-C 12/17/15 1430  Glynn Octave, MD 12/17/15 [phone]

## 2016-01-03 ENCOUNTER — Emergency Department (HOSPITAL_COMMUNITY)
Admission: EM | Admit: 2016-01-03 | Discharge: 2016-01-03 | Disposition: A | Discharge status: Home / Self Care | Payer: Medicaid Other | Attending: Emergency Medicine | Admitting: Emergency Medicine

## 2016-01-03 ENCOUNTER — Encounter (HOSPITAL_COMMUNITY): Payer: Self-pay

## 2016-01-03 DIAGNOSIS — M79644 Pain in right finger(s): Secondary | ICD-10-CM | POA: Diagnosis present

## 2016-01-03 DIAGNOSIS — Z87891 Personal history of nicotine dependence: Secondary | ICD-10-CM | POA: Diagnosis not present

## 2016-01-03 DIAGNOSIS — Z791 Long term (current) use of non-steroidal anti-inflammatories (NSAID): Secondary | ICD-10-CM | POA: Insufficient documentation

## 2016-01-03 DIAGNOSIS — L089 Local infection of the skin and subcutaneous tissue, unspecified: Secondary | ICD-10-CM

## 2016-01-03 DIAGNOSIS — S60351D Superficial foreign body of right thumb, subsequent encounter: Secondary | ICD-10-CM

## 2016-01-03 DIAGNOSIS — W25XXXD Contact with sharp glass, subsequent encounter: Secondary | ICD-10-CM | POA: Insufficient documentation

## 2016-01-03 MED ORDER — DOXYCYCLINE HYCLATE 100 MG PO CAPS: 100.0000 mg | ORAL_CAPSULE | Freq: Two times a day (BID) | ORAL | Status: DC

## 2016-01-03 NOTE — Discharge Instructions (Signed)
Keep wound clean with mild soap and water. Use ice and heat, and elevate for additional pain relief and swelling. Soak your hand in warm salty water at least 3-4 times daily to help get the glass out of your finger. Alternate between Naprosyn and Tylenol for additional pain relief. Take antibiotic as directed. Follow up with your primary care doctor listed on your medicaid card in approximately 5-7 days for wound recheck. Monitor area for signs of infection to include, but not limited to: increasing pain, spreading redness, drainage/pus, worsening swelling, or fevers. Return to emergency department for emergent changing or worsening symptoms.

## 2016-01-03 NOTE — ED Provider Notes (Signed)
CSN: [id number]     Arrival date & time 01/03/16  1856 History  By signing my name below, I, Octavia Heir, attest that this documentation has been prepared under the direction and in the presence of Charmelle Soh Camprubi-Soms, PA-C . Electronically Signed: Octavia Heir, ED Scribe. 01/03/2016. 8:08 PM.    Chief Complaint  Patient presents with  . Foreign Body     Patient is a 40 y.o. male presenting with foreign body. The history is provided by the patient. No language interpreter was used.  Foreign Body Location:  Skin Suspected object:  Glass Pain quality:  Aching and throbbing Pain severity:  Moderate Duration:  3 weeks Timing:  Intermittent Progression:  Worsening Chronicity:  New Worsened by:  Certain positions Ineffective treatments: removal attempt. Associated symptoms: no abdominal pain, no nausea and no vomiting    HPI Comments: Bruce [name] is a 40 y.o. otherwise healthy male who presents to the Emergency Department complaining of intermittent, gradual worsening, 5/10, throbbing/aching nonradiating right thumb pain with associated swelling and warmth to the area onset 2-3 weeks ago. Pt says he believes he got some glass stuck in his right thumb three weeks ago when he reached into a dresser and a small shard of ?glass went into his thumb.  He tried to dig out the piece of glass 2-3days ago, and he thinks he removed a shard of glass, but notes that the pain has gotten worse since. Pt says his thumb hurts only with movement. He has been taking naproxen to alleviate his pain with minimal relief. He denies drainage, redness, red streaking, fevers, chills, CP, SOB, abd pain, N/V/D/C, hematuria, dysuria, numbness, tingling, weakness, or any other symptoms. Pt has no known drug allergies. Last tetanus 55yrs ago.  History reviewed. No pertinent past medical history. History reviewed. No pertinent past surgical history. No family history on file. Social History  Substance Use Topics  .  Smoking status: Former Smoker -- 1.00 packs/day    Types: Cigarettes  . Smokeless tobacco: None  . Alcohol Use: None    Review of Systems  Constitutional: Negative for fever and chills.  Respiratory: Negative for shortness of breath.   Cardiovascular: Negative for chest pain.  Gastrointestinal: Negative for nausea, vomiting, abdominal pain, diarrhea and constipation.  Genitourinary: Negative for dysuria and hematuria.  Musculoskeletal: Positive for joint swelling and arthralgias (R thumb). Negative for myalgias.  Skin: Positive for wound. Negative for color change and rash.  Allergic/Immunologic: Negative for immunocompromised state.  Neurological: Negative for weakness and numbness.  Psychiatric/Behavioral: Negative for confusion.    10 Systems reviewed and are negative for acute change except as noted in the HPI.   Allergies  Review of patient's allergies indicates no known allergies.  Home Medications   Prior to Admission medications   Medication Sig Start Date End Date Taking? Authorizing Provider  acetaminophen (TYLENOL) 500 MG tablet Take 500 mg by mouth every 6 (six) hours as needed (pain).    Historical Provider, MD  naproxen (NAPROSYN) 500 MG tablet Take 1 tablet (500 mg total) by mouth 2 (two) times daily. 12/17/15   Cheri Fowler, PA-C   Triage vitals: BP 148/82 mmHg  Pulse 75  Temp(Src) 97.9 F (36.6 C) (Oral)  Resp 16  SpO2 99% Physical Exam  Constitutional: He is oriented to person, place, and time. Vital signs are normal. He appears well-developed and well-nourished.  Non-toxic appearance. No distress.  Afebrile, nontoxic, NAD  HENT:  Head: Normocephalic and atraumatic.  Mouth/Throat: Mucous membranes are  normal.  Eyes: Conjunctivae and EOM are normal. Right eye exhibits no discharge. Left eye exhibits no discharge.  Neck: Normal range of motion. Neck supple.  Cardiovascular: Normal rate and intact distal pulses.   Pulmonary/Chest: Effort normal. No  respiratory distress.  Abdominal: Normal appearance. He exhibits no distension.  Musculoskeletal: Normal range of motion.       Right hand: He exhibits tenderness and swelling. He exhibits normal range of motion, no bony tenderness, normal capillary refill, no deformity and no laceration. Normal sensation noted. Normal strength noted.  Right thumb with FROM intact, with mild tenderness to the palmar aspect of the PIP joint overlying coarse skin with no palpable underlying foreign bodies, skin intact in this area with no drainage or erythema, no warmth, minimal swelling to the area. No fluctuance or induration, no drainage. Cap refill brisk and present, strength and sensation grossly intact.  Neurological: He is alert and oriented to person, place, and time. He has normal strength. No sensory deficit.  Skin: Skin is warm, dry and intact. No rash noted.  Psychiatric: He has a normal mood and affect.  Nursing note and vitals reviewed.   ED Course  Procedures  DIAGNOSTIC STUDIES: Oxygen Saturation is 99% on RA, normal by my interpretation.  COORDINATION OF CARE:  8:04 PM Discussed treatment plan with pt at bedside and pt agreed to plan.  Labs Review Labs Reviewed - No data to display  Imaging Review No results found. I have personally reviewed and evaluated these images and lab results as part of my medical decision-making.   EKG Interpretation None      MDM   Final diagnoses:  Thumb pain, right  Foreign body of thumb, right, subsequent encounter  Infection of thumb    40 y.o. male here with ?glass in R thumb x3wks, tried to cut it out of his thumb 3 days ago, pain and swelling persisting. Thinks he got the glass out, but isn't sure. No palpable FBs felt, and skin has closed up and healed over the area that he thinks there was glass. No erythema or warmth, no induration or fluctuance, no evidence of felon infx. Discussed that glass doesn't show up well on xray, so no indication to  do this, and that a FB that has been in there for this long would be difficult if not impossible to remove at this point in time. Discussed that we will start on abx to ensure he is covered for infx since he tried to remove it himself. UTD on tetanus. Discussed tylenol/motrin and use of ice/heat. Soak in warm water to try to expel the glass, if any still in there. F/up with PCP in 3-5 days. I explained the diagnosis and have given explicit precautions to return to the ER including for any other new or worsening symptoms. The patient understands and accepts the medical plan as it's been dictated and I have answered their questions. Discharge instructions concerning home care and prescriptions have been given. The patient is STABLE and is discharged to home in good condition.   I personally performed the services described in this documentation, which was scribed in my presence. The recorded information has been reviewed and is accurate.  BP 148/82 mmHg  Pulse 75  Temp(Src) 97.9 F (36.6 C) (Oral)  Resp 16  SpO2 99%  Meds ordered this encounter  Medications  . doxycycline (VIBRAMYCIN) 100 MG capsule    Sig: Take 1 capsule (100 mg total) by mouth 2 (two) times daily. One  po bid x 7 days    Dispense:  14 capsule    Refill:  0    Order Specific Question:  Supervising Provider    Answer:  Eber HongMILLER, BRIAN [[id number]]     Francine Hannan Camprubi-Soms, PA-C 01/03/16 2018  Pricilla LovelessScott Goldston, MD 01/04/16 [id number]

## 2016-01-03 NOTE — ED Notes (Signed)
Pt reports he thinks there may be a piece of glass stuck in his right thumb. Right thumb appears swollen and is painful. He states he tried to dig out the object himself without success.

## 2018-04-15 ENCOUNTER — Other Ambulatory Visit: Payer: Self-pay

## 2018-04-15 ENCOUNTER — Encounter: Payer: Self-pay | Admitting: Internal Medicine

## 2018-04-15 ENCOUNTER — Ambulatory Visit (INDEPENDENT_AMBULATORY_CARE_PROVIDER_SITE_OTHER): Payer: Medicaid Other | Admitting: Internal Medicine

## 2018-04-15 VITALS — BP 136/97 | HR 96 | Temp 98.0°F | Ht 76.0 in | Wt 275.9 lb

## 2018-04-15 DIAGNOSIS — Z9189 Other specified personal risk factors, not elsewhere classified: Secondary | ICD-10-CM | POA: Insufficient documentation

## 2018-04-15 DIAGNOSIS — Z0000 Encounter for general adult medical examination without abnormal findings: Secondary | ICD-10-CM

## 2018-04-15 DIAGNOSIS — R768 Other specified abnormal immunological findings in serum: Secondary | ICD-10-CM | POA: Diagnosis not present

## 2018-04-15 DIAGNOSIS — F319 Bipolar disorder, unspecified: Secondary | ICD-10-CM | POA: Insufficient documentation

## 2018-04-15 DIAGNOSIS — F17210 Nicotine dependence, cigarettes, uncomplicated: Secondary | ICD-10-CM

## 2018-04-15 DIAGNOSIS — F209 Schizophrenia, unspecified: Secondary | ICD-10-CM | POA: Insufficient documentation

## 2018-04-15 DIAGNOSIS — K921 Melena: Secondary | ICD-10-CM

## 2018-04-15 DIAGNOSIS — K219 Gastro-esophageal reflux disease without esophagitis: Secondary | ICD-10-CM | POA: Insufficient documentation

## 2018-04-15 DIAGNOSIS — R030 Elevated blood-pressure reading, without diagnosis of hypertension: Secondary | ICD-10-CM

## 2018-04-15 DIAGNOSIS — F329 Major depressive disorder, single episode, unspecified: Secondary | ICD-10-CM | POA: Insufficient documentation

## 2018-04-15 DIAGNOSIS — R197 Diarrhea, unspecified: Secondary | ICD-10-CM

## 2018-04-15 DIAGNOSIS — Z6833 Body mass index (BMI) 33.0-33.9, adult: Secondary | ICD-10-CM | POA: Diagnosis not present

## 2018-04-15 DIAGNOSIS — F3170 Bipolar disorder, currently in remission, most recent episode unspecified: Secondary | ICD-10-CM

## 2018-04-15 DIAGNOSIS — Z79899 Other long term (current) drug therapy: Secondary | ICD-10-CM | POA: Diagnosis not present

## 2018-04-15 DIAGNOSIS — R5383 Other fatigue: Secondary | ICD-10-CM

## 2018-04-15 DIAGNOSIS — R51 Headache: Secondary | ICD-10-CM

## 2018-04-15 DIAGNOSIS — R110 Nausea: Secondary | ICD-10-CM

## 2018-04-15 DIAGNOSIS — G479 Sleep disorder, unspecified: Secondary | ICD-10-CM

## 2018-04-15 DIAGNOSIS — Z23 Encounter for immunization: Secondary | ICD-10-CM | POA: Diagnosis not present

## 2018-04-15 DIAGNOSIS — Z789 Other specified health status: Secondary | ICD-10-CM | POA: Insufficient documentation

## 2018-04-15 DIAGNOSIS — F419 Anxiety disorder, unspecified: Secondary | ICD-10-CM | POA: Insufficient documentation

## 2018-04-15 DIAGNOSIS — F32A Depression, unspecified: Secondary | ICD-10-CM

## 2018-04-15 DIAGNOSIS — E669 Obesity, unspecified: Secondary | ICD-10-CM | POA: Diagnosis not present

## 2018-04-15 HISTORY — DX: Other specified health status: Z78.9

## 2018-04-15 HISTORY — DX: Other specified personal risk factors, not elsewhere classified: Z91.89

## 2018-04-15 LAB — POCT GLYCOSYLATED HEMOGLOBIN (HGB A1C): Hemoglobin A1C: 5.6 % (ref 4.0–5.6)

## 2018-04-15 LAB — GLUCOSE, CAPILLARY: Glucose-Capillary: 101 mg/dL — ABNORMAL HIGH (ref 70–99)

## 2018-04-15 MED ORDER — PANTOPRAZOLE SODIUM 40 MG PO TBEC: 40.0000 mg | DELAYED_RELEASE_TABLET | Freq: Every day | ORAL | 0 refills | Status: DC

## 2018-04-15 NOTE — Progress Notes (Signed)
   CC: Establish care and follow-up regarding positive hepatitis B result  HPI:  Mr.Abbas Gwenevere AbbotMorton is a 42 y.o. with past medical history documented below presents to establish care. Please see problem based charting for evaluation, assessment, and plan.   Past Medical History:  Diagnosis Date  . Anxiety disorder   . Bipolar 1 disorder (HCC)   . Depression   . Schizophrenia (HCC)      Review of Systems:    Has headaches, nausea, fatigue, difficulty sleeping, loose stools Has blood in stools  Physical Exam:  Vitals:   04/15/18 1456  BP: (!) 136/97  Pulse: 96  Temp: 98 F (36.7 C)  TempSrc: Oral  SpO2: 99%  Weight: 275 lb 14.4 oz (125.1 kg)  Height: 6\' 4"  (1.93 m)   Physical Exam  Constitutional: He appears well-developed and well-nourished. No distress.  HENT:  Head: Normocephalic and atraumatic.  Eyes:  Injected conjunctiva  Cardiovascular: Normal rate, regular rhythm and normal heart sounds.  Respiratory: Effort normal. No respiratory distress. He has wheezes.  GI: Soft. Bowel sounds are normal. He exhibits no distension. There is tenderness (to palpation in epigastric region).  Musculoskeletal: He exhibits no edema.  Neurological: He is alert.  Skin: No rash noted. He is not diaphoretic. No erythema.  Psychiatric: He has a normal mood and affect. His behavior is normal. Judgment and thought content normal.   Assessment & Plan:   See Encounters Tab for problem based charting.  Patient discussed with Dr. Rogelia BogaButcher

## 2018-04-15 NOTE — Patient Instructions (Signed)
It was a pleasure to see you today Mr Bruce [name].  I have ordered some labs to confirm your diagnosis for hepatitis B. I have also ordered protonix 40mg  daily to help with your reflux symptoms.   Please go to sleep study to get evaluated for sleep apnea  If you have any questions or concerns, please call our clinic at [phone] between 9am-5pm and after hours call [phone] and ask for the internal medicine resident on call. If you feel you are having a medical emergency please call 911.   Thank you, we look forward to help you remain healthy!  Lorenso CourierVahini Kalijah Zeiss, MD Internal Medicine PGY2

## 2018-04-15 NOTE — Assessment & Plan Note (Signed)
The patient's blood pressure during this visit was 136/97.  He states that he has had his blood pressure check at [facility] and was told by his nurse that it is elevated.  Assessment and plan The patient's blood pressure goal is <140/80 per [facility] 8.  The patient is obese and has a BMI of 33.5.  He states that he has gained 100 pounds in the past 10 years.  Since this is the first visit where patient is noted to have a borderline elevated left pressure will monitor him and recommend lifestyle modifications.  The patient's elevated blood pressure may also be due to secondary causes such as sleep apnea, his chronic cigarette use.  -Referral made to nutrition for lifestyle modifications -Follow-up in 6 weeks

## 2018-04-15 NOTE — Assessment & Plan Note (Signed)
The patient states that he has been having upper abdominal pain for several months now.  On examination the pain is more pronounced in the epigastric region.  He states that he has a dry cough or waking up in the morning and has symptoms of reflux.    Assessment and plan The patient has signs and symptoms consistent with GERD.  He has several risk factors including current everyday smoking, weight gain, consumption of caffeine and chocolate.  -Started patient on a Protonix trial 40 mg daily -Ordered CBC to check for any anemia secondary to occult bleeding

## 2018-04-15 NOTE — Assessment & Plan Note (Signed)
The patient states that he was screened for hepatitis B at [facility] and he decided to follow[facility].  The patient has been having abdominal pain, nausea, vomiting.  Assessment and plan We will verify the patient's hepatitis C positive results with requesting records. Will also ordered CMP, hepatitis B surface antigen, hepatitis B antibody, hepatitis C antibody, HIV test.

## 2018-04-16 LAB — CMP14 + ANION GAP
ALT: 43 IU/L (ref 0–44)
AST: 40 IU/L (ref 0–40)
Albumin/Globulin Ratio: 1.4 (ref 1.2–2.2)
Albumin: 4.6 g/dL (ref 3.5–5.5)
Alkaline Phosphatase: 127 IU/L — ABNORMAL HIGH (ref 39–117)
Anion Gap: 16 mmol/L (ref 10.0–18.0)
BUN/Creatinine Ratio: 10 (ref 9–20)
BUN: 13 mg/dL (ref 6–20)
Bilirubin Total: 0.6 mg/dL (ref 0.0–1.2)
CO2: 20 mmol/L (ref 20–29)
Calcium: 9.6 mg/dL (ref 8.7–10.2)
Chloride: 105 mmol/L (ref 96–106)
Creatinine, Ser: 1.34 mg/dL — ABNORMAL HIGH (ref 0.76–1.27)
GFR calc Af Amer: 77 mL/min/{1.73_m2} (ref >59)
GFR calc non Af Amer: 66 mL/min/{1.73_m2} (ref >59)
Globulin, Total: 3.4 g/dL (ref 1.5–4.5)
Glucose: 94 mg/dL (ref 65–99)
Potassium: 4.6 mmol/L (ref 3.5–5.2)
Sodium: 141 mmol/L (ref 134–144)
Total Protein: 8 g/dL (ref 6.0–8.5)

## 2018-04-16 LAB — CBC
Hematocrit: 49.6 % (ref 37.5–51.0)
Hemoglobin: 16.7 g/dL (ref 13.0–17.7)
MCH: 28.8 pg (ref 26.6–33.0)
MCHC: 33.7 g/dL (ref 31.5–35.7)
MCV: 86 fL (ref 79–97)
Platelets: 262 10*3/uL (ref 150–450)
RBC: 5.8 x10E6/uL (ref 4.14–5.80)
RDW: 14.4 % (ref 12.3–15.4)
WBC: 11.2 10*3/uL — ABNORMAL HIGH (ref 3.4–10.8)

## 2018-04-16 LAB — HIV ANTIBODY (ROUTINE TESTING W REFLEX): HIV Screen 4th Generation wRfx: NONREACTIVE

## 2018-04-16 LAB — HEPATITIS B SURFACE ANTIBODY,QUALITATIVE: Hep B Surface Ab, Qual: REACTIVE

## 2018-04-16 LAB — HEPATITIS B SURFACE ANTIGEN: Hepatitis B Surface Ag: NEGATIVE

## 2018-04-16 LAB — HEPATITIS C ANTIBODY: Hep C Virus Ab: <0.1 s/co ratio (ref 0.0–0.9)

## 2018-04-18 NOTE — Progress Notes (Signed)
Internal Medicine Clinic Attending  Case discussed with Dr. Chundi at the time of the visit.  We reviewed the resident's history and exam and pertinent patient test results.  I agree with the assessment, diagnosis, and plan of care documented in the resident's note. 

## 2018-05-02 ENCOUNTER — Ambulatory Visit: Payer: Medicaid Other | Admitting: Dietician

## 2018-05-02 DIAGNOSIS — E669 Obesity, unspecified: Secondary | ICD-10-CM

## 2018-05-02 NOTE — Progress Notes (Signed)
Documentation: [name] and his significant other attended nutrition visit today. He is concerned about 50# weight gain in past 2 years and decreased energy. He usually loses weight that he has gained while unemployed when he goes back to work, but this time he has not lost the weight.  He identified that maybe his 6 bottles Mt Dew might be a problem for him.  He set a goal with assistance of how to decrease his Mt. Dew and other sugar sweetened beverage consumption to see if this would help him with weight loss.  His significant other is very supportive of his goal and plans to buy healthier beverages and decrease her sugar sweetened beverages also.   Usual bodyweight ~ 235# Ideal bodyweight- 202#-222# Estimated body mass index is 34.89 kg/m as calculated from the following:   Height as of 04/15/18: 6\' 4"  (1.93 m).   Weight as of this encounter: 286 lb 9.6 oz (130 kg).  Wt Readings from Last 5 Encounters:  05/02/18 286 lb 9.6 oz (130 kg)  04/15/18 275 lb 14.4 oz (125.1 kg)  06/02/14 240 lb (108.9 kg)   Follow up was arranged to evaluate and monitor his plan.  Norm Parcelonna Nataleigh Griffin, RD 05/02/2018 3:57 PM.

## 2018-05-02 NOTE — Patient Instructions (Addendum)
No Mt Dew soda for the next month and limit calories in what I drink to  Help me feel better, have more energy and lose weight  Lupita LeashDonna  [phone]

## 2018-05-20 ENCOUNTER — Ambulatory Visit (INDEPENDENT_AMBULATORY_CARE_PROVIDER_SITE_OTHER): Payer: Medicaid Other | Admitting: Internal Medicine

## 2018-05-20 ENCOUNTER — Other Ambulatory Visit: Payer: Self-pay

## 2018-05-20 ENCOUNTER — Encounter (INDEPENDENT_AMBULATORY_CARE_PROVIDER_SITE_OTHER): Payer: Self-pay

## 2018-05-20 ENCOUNTER — Encounter: Payer: Self-pay | Admitting: Internal Medicine

## 2018-05-20 VITALS — BP 131/91 | HR 86 | Temp 98.4°F | Wt 275.1 lb

## 2018-05-20 DIAGNOSIS — K219 Gastro-esophageal reflux disease without esophagitis: Secondary | ICD-10-CM | POA: Diagnosis present

## 2018-05-20 DIAGNOSIS — Z720 Tobacco use: Secondary | ICD-10-CM

## 2018-05-20 DIAGNOSIS — Z789 Other specified health status: Secondary | ICD-10-CM | POA: Diagnosis not present

## 2018-05-20 DIAGNOSIS — Z79899 Other long term (current) drug therapy: Secondary | ICD-10-CM | POA: Diagnosis not present

## 2018-05-20 DIAGNOSIS — F17200 Nicotine dependence, unspecified, uncomplicated: Secondary | ICD-10-CM | POA: Insufficient documentation

## 2018-05-20 MED ORDER — PANTOPRAZOLE SODIUM 40 MG PO TBEC: 40.0000 mg | DELAYED_RELEASE_TABLET | Freq: Every day | ORAL | 0 refills | Status: DC

## 2018-05-20 MED ORDER — NICOTINE 21 MG/24HR TD PT24: 21.0000 mg | MEDICATED_PATCH | TRANSDERMAL | 1 refills | Status: DC

## 2018-05-20 NOTE — Patient Instructions (Signed)
FOLLOW-UP INSTRUCTIONS When: At your regularly scheduled visit with your primary care provider or within one year For: Routine visit What to bring: All of your medications  Based on the hepatitis B serologies we have obtained you do not have Hepatitis B.   I have refilled your pantoprazole but please be mindful that this is not ideally a long term medication and should be reserved for short term use while making dietary adjustments.   Thank you for your visit to the [facility] today.

## 2018-05-20 NOTE — Assessment & Plan Note (Signed)
Tobacco use disorder: Patient endorses desire to stop smoking one ppd. He was given a script to nicotine patches at 21mg  daily.

## 2018-05-20 NOTE — Assessment & Plan Note (Signed)
Bruce [name] is a 42 y.o. male who presents today for discussion concerning his recent hepatitis B screening. He stated that he was told he was positive at a prior screening at second facility. The patient was advised that his screening here was consistent with immunity but not infection of Hepatitis B or HIV. He denied acute concerns including chest pain, dyspnea, headache or nausea today.

## 2018-05-20 NOTE — Progress Notes (Signed)
   CC: hep B screening results  HPI:Mr.Dylan Monforte is a 42 y.o. male who presents today for discussion concerning his recent hepatitis B screening. He stated that he was told he was positive at a prior screening at second facility. The patient was advised that his screening here was consistent with immunity but not infection of Hepatitis B or HIV. He denied acute concerns including chest pain, dyspnea, headache or nausea today.  GERD: Refilled pantoprazole but advised patient that this is not ideally a long term therapy and that dietary modification was the preferred treatment.   Tobacco use disorder: Patient endorses desire to stop smoking one ppd. He was given a script to nicotine patches at 21mg  daily.   Past Medical History:  Diagnosis Date  . Anxiety disorder   . Bipolar 1 disorder (HCC)   . Depression   . Schizophrenia (HCC)    Review of Systems:  ROS negative except as per HPI.  Physical Exam:  Vitals:   05/20/18 1028  BP: (!) 124/99  SpO2: 100%  Weight: 275 lb 1.6 oz (124.8 kg)   General: A/O x4, in no acute distress, afebrile, nondiaphoretic Cardio: RRR, no mrg's Pulm: CTA bilaterally   Assessment & Plan:   See Encounters Tab for problem based charting.  Patient discussed with Dr. Criselda Peaches

## 2018-05-20 NOTE — Assessment & Plan Note (Addendum)
GERD: Symptoms greatly improved with PPI. Refilled pantoprazole but advised patient that this is not ideally a long term therapy and that dietary modification was the preferred treatment. Patient given dietary counseling.

## 2018-05-21 NOTE — Progress Notes (Signed)
Internal Medicine Clinic Attending  Case discussed with Dr. Harbrecht at the time of the visit.  We reviewed the resident's history and exam and pertinent patient test results.  I agree with the assessment, diagnosis, and plan of care documented in the resident's note.   

## 2018-05-30 ENCOUNTER — Ambulatory Visit: Payer: Medicaid Other | Admitting: Dietician

## 2018-11-12 ENCOUNTER — Other Ambulatory Visit: Payer: Self-pay

## 2018-11-12 ENCOUNTER — Telehealth (INDEPENDENT_AMBULATORY_CARE_PROVIDER_SITE_OTHER): Payer: Medicaid Other | Admitting: Internal Medicine

## 2018-11-12 ENCOUNTER — Encounter: Payer: Self-pay | Admitting: Internal Medicine

## 2018-11-12 DIAGNOSIS — F17200 Nicotine dependence, unspecified, uncomplicated: Secondary | ICD-10-CM

## 2018-11-12 DIAGNOSIS — K219 Gastro-esophageal reflux disease without esophagitis: Secondary | ICD-10-CM | POA: Diagnosis not present

## 2018-11-12 DIAGNOSIS — I10 Essential (primary) hypertension: Secondary | ICD-10-CM | POA: Diagnosis not present

## 2018-11-12 MED ORDER — NICOTINE POLACRILEX 4 MG MT GUM: 4.0000 mg | CHEWING_GUM | OROMUCOSAL | 0 refills | Status: DC | PRN

## 2018-11-12 NOTE — Progress Notes (Addendum)
This is a telephone encounter between Maryan Rued and [employer] on 11/12/2018 for routine visit. The visit was conducted with the patient located at home and [employer] at [facility]. The patient's identity was confirmed using their DOB and current address. The patient has consented to being evaluated through a telephone encounter and understands the associated risks (an examination cannot be done and the patient may need to come in for an appointment) / benefits (allows the patient to remain at home, decreasing exposure to coronavirus). I personally spent 12 min on medical discussion.      CC: GERD and high blood pressure  HPI:Mr.Mick Achter is a 43 y.o. male who was called to discuss his high blood pressure and GERD. Please see individual problem based A/P for details.  GERD: Since losing weight the patient did not experience the degree of symptoms that he previously had. As such, he has stopped taking the pantoprazole daily and has not had any recent symptoms despite cessation of this medication over the past month  Plan: Agree with discontinuing pantoprazole   Hypertension: Patient's BP has been stable although he does not recall the recent value. He donates blood weekly and has been told it has normalized now that he has lost weight.  He denied, chest pain, headache, visual changes, lightheadedness, weakness, dizziness on standing, swelling in the feet or ankles.  Muscle cramps routinely. Has increased his water intake to 2-4 glasses daily but will continue to increase this to 6-8 as he decreases his soda intake.    Plan: Continue weight loss and low sodium diet Monitor BP at next visit Consider a BMP at his next in 3-6 months  Smoking cessation: Has continues to smoke but is motivated to stop as he has a new son. He utilized the patches but the cravings did not resolve. He wishes to continue to attempt cessation and would like help with this.   Plan:  Continue smoking  cessation discussion Provided script for nicotine gum PRN  Continue patch daily  Past Medical History:  Diagnosis Date  . Anxiety disorder   . Bipolar 1 disorder (HCC)   . Depression   . Schizophrenia (HCC)    Review of Systems:   ROS negative except as per HPI.  Assessment & Plan:   See Encounters Tab for problem based charting.  Patient discussed with Dr. Oswaldo Done

## 2018-11-12 NOTE — Assessment & Plan Note (Signed)
  Smoking cessation: Has continues to smoke but is motivated to stop as he has a new son. He utilized the patches but the cravings did not resolve. He wishes to continue to attempt cessation and would like help with this.   Plan:  Continue smoking cessation discussion Provided script for nicotine gum PRN  Continue patch daily

## 2018-11-12 NOTE — Assessment & Plan Note (Signed)
GERD: Since losing weight the patient did not experience the degree of symptoms that he previously had. As such, he has stopped taking the pantoprazole daily and has not had any recent symptoms despite cessation of this medication over the past month  Plan: Agree with discontinuing pantoprazole

## 2018-11-12 NOTE — Progress Notes (Signed)
Internal Medicine Clinic Attending  Case discussed with Dr. Harbrecht. We reviewed the resident's history and exam and pertinent patient test results.  I agree with the assessment, diagnosis, and plan of care documented in the resident's note.  

## 2018-11-12 NOTE — Assessment & Plan Note (Signed)
  Hypertension: Patient's BP has been stable although he does not recall the recent value. He donates blood weekly and has been told it has normalized now that he has lost weight.  He denied, chest pain, headache, visual changes, lightheadedness, weakness, dizziness on standing, swelling in the feet or ankles.  Muscle cramps routinely. Has increased his water intake to 2-4 glasses daily but will continue to increase this to 6-8 as he decreases his soda intake.    Plan: Continue weight loss and low sodium diet Monitor BP at next visit Consider a BMP at his next in 3-6 months

## 2019-01-14 ENCOUNTER — Telehealth: Payer: Self-pay

## 2019-01-14 NOTE — Telephone Encounter (Signed)
I agree. We can discuss this at his appointment in June. However, I do believe that appointment is a televisit appointment. He will need to have a BP check.

## 2019-01-14 NOTE — Telephone Encounter (Signed)
Requesting to speak with a nurse about meds. Please call back.  

## 2019-01-14 NOTE — Telephone Encounter (Signed)
Pt wanted a refill on a medicine for BP, could not find in med history, after asking more questions it is noted that the med was prescribed by a doctor at another practice that pt has been to in the past. He is ask to speak to the doctor he sees on June 2 about this, he is agreeable

## 2019-01-14 NOTE — Telephone Encounter (Signed)
Called pt - no answer; left message to call us back. 

## 2019-01-20 ENCOUNTER — Other Ambulatory Visit: Payer: Self-pay

## 2019-01-20 ENCOUNTER — Ambulatory Visit: Payer: Medicaid Other | Admitting: Internal Medicine

## 2019-01-20 VITALS — BP 147/88 | HR 87 | Temp 98.2°F | Ht 75.0 in | Wt 265.9 lb

## 2019-01-20 DIAGNOSIS — M79645 Pain in left finger(s): Secondary | ICD-10-CM | POA: Insufficient documentation

## 2019-01-20 DIAGNOSIS — Z79899 Other long term (current) drug therapy: Secondary | ICD-10-CM

## 2019-01-20 DIAGNOSIS — I10 Essential (primary) hypertension: Secondary | ICD-10-CM | POA: Diagnosis not present

## 2019-01-20 MED ORDER — HYDROCHLOROTHIAZIDE 12.5 MG PO TABS: 12.5000 mg | ORAL_TABLET | Freq: Every day | ORAL | 2 refills | Status: DC

## 2019-01-20 NOTE — Patient Instructions (Addendum)
Thank you for allowing [place] to care for you  For your high blood pressure - BP elevated today - Start taking hydrochlorothiazide 12.5mg  Daily - Follow up in 1 month for labs and BP check with PCP  For you finger pain - Your finger does not appear infected - You may have a foreign body - Left Hand Xray ordered today  - You will be contacted with results

## 2019-01-20 NOTE — Assessment & Plan Note (Addendum)
Patient reports increased pain and pain frequency at the tip of his left 3rd distal phalanx. Pain is intermittent and is trigger by palpation or other significant pressure over the area. The pain is located at a small ulcer like lesion 15mm in diameter at the base of his nail bed. He report sustaining and injury to the area when he was 43yo while running his fingers through carpet. He states that at time he picks at it and will produce some serosanguinous drainage. While it has intermittently irritated him for the past 20 years it has become worse in the past 6 months. No signs or symptoms of active infection. We discussed obtaining an xray to evaluate for possible foreign body. - Xray Left Hand

## 2019-01-20 NOTE — Assessment & Plan Note (Signed)
BP elevated today to 147/88 and patient expresses interest in restarting antihypertensive medication. He states he was previously on a medication for his BP, but he cannot remember what and there is no record of this. Will start HCTZ. Last BMP in Aug WNL except mildly elevated Cr to 1.3 (stable for years). - HCTZ 12.5mg  Daily - Follow up in about 1 month with PCP for BP check and BMP

## 2019-01-20 NOTE — Progress Notes (Signed)
   CC: L finger pain, HTN   HPI:  Bruce [name] is a 43 y.o. M with PMHx listed below presenting for L finger pain, HTN. Please see the A&P for the status of the patient's chronic medical problems.  Past Medical History:  Diagnosis Date  . Anxiety disorder   . At risk for obstructive sleep apnea 04/15/2018  . Bipolar 1 disorder (HCC)   . Depression   . Hepatitis B immune 04/15/2018  . Schizophrenia (HCC)    Review of Systems:  Performed and all others negative.  Physical Exam:  Vitals:   01/20/19 1436  BP: (!) 147/88  Pulse: 87  Temp: 98.2 F (36.8 C)  TempSrc: Oral  SpO2: 100%  Weight: 265 lb 14.4 oz (120.6 kg)  Height: 6\' 3"  (1.905 m)    Physical Exam Constitutional:      General: He is not in acute distress.    Appearance: Normal appearance.  Cardiovascular:     Rate and Rhythm: Normal rate and regular rhythm.     Pulses: Normal pulses.     Heart sounds: Normal heart sounds.  Pulmonary:     Effort: Pulmonary effort is normal. No respiratory distress.     Breath sounds: Normal breath sounds.  Abdominal:     General: Bowel sounds are normal. There is no distension.     Palpations: Abdomen is soft.     Tenderness: There is no abdominal tenderness.  Musculoskeletal:        General: No swelling.     Comments: All fingernails shortened into the nailbed Left third digit with apparent small 75mm ulceration  No edema, erythema, or discharge Tender to palpation of distal portion of distal phalanx  Skin:    General: Skin is warm and dry.  Neurological:     General: No focal deficit present.     Mental Status: Mental status is at baseline.    Assessment & Plan:   See Encounters Tab for problem based charting.  Patient discussed with Dr. Oswaldo Done

## 2019-01-21 NOTE — Progress Notes (Signed)
Internal Medicine Clinic Attending  Case discussed with Dr. Melvin  at the time of the visit.  We reviewed the resident's history and exam and pertinent patient test results.  I agree with the assessment, diagnosis, and plan of care documented in the resident's note.  

## 2019-03-11 ENCOUNTER — Ambulatory Visit: Payer: Medicaid Other | Admitting: Internal Medicine

## 2019-03-11 ENCOUNTER — Encounter: Payer: Self-pay | Admitting: Internal Medicine

## 2019-03-11 ENCOUNTER — Ambulatory Visit (HOSPITAL_COMMUNITY)
Admission: RE | Admit: 2019-03-11 | Discharge: 2019-03-11 | Disposition: A | Discharge status: Home / Self Care | Payer: Medicaid Other | Source: Ambulatory Visit | Attending: Internal Medicine | Admitting: Internal Medicine

## 2019-03-11 ENCOUNTER — Other Ambulatory Visit: Payer: Self-pay

## 2019-03-11 VITALS — BP 140/70 | HR 72 | Temp 98.6°F | Wt 256.9 lb

## 2019-03-11 DIAGNOSIS — K047 Periapical abscess without sinus: Secondary | ICD-10-CM | POA: Diagnosis not present

## 2019-03-11 DIAGNOSIS — Z8639 Personal history of other endocrine, nutritional and metabolic disease: Secondary | ICD-10-CM | POA: Diagnosis not present

## 2019-03-11 DIAGNOSIS — M868X4 Other osteomyelitis, hand: Secondary | ICD-10-CM

## 2019-03-11 DIAGNOSIS — E785 Hyperlipidemia, unspecified: Secondary | ICD-10-CM

## 2019-03-11 DIAGNOSIS — Z79899 Other long term (current) drug therapy: Secondary | ICD-10-CM

## 2019-03-11 DIAGNOSIS — Z720 Tobacco use: Secondary | ICD-10-CM

## 2019-03-11 DIAGNOSIS — F419 Anxiety disorder, unspecified: Secondary | ICD-10-CM

## 2019-03-11 DIAGNOSIS — Z13220 Encounter for screening for lipoid disorders: Secondary | ICD-10-CM

## 2019-03-11 DIAGNOSIS — F17200 Nicotine dependence, unspecified, uncomplicated: Secondary | ICD-10-CM

## 2019-03-11 DIAGNOSIS — M79645 Pain in left finger(s): Secondary | ICD-10-CM | POA: Diagnosis not present

## 2019-03-11 DIAGNOSIS — I10 Essential (primary) hypertension: Secondary | ICD-10-CM | POA: Diagnosis not present

## 2019-03-11 DIAGNOSIS — F32A Depression, unspecified: Secondary | ICD-10-CM

## 2019-03-11 DIAGNOSIS — F329 Major depressive disorder, single episode, unspecified: Secondary | ICD-10-CM

## 2019-03-11 MED ORDER — HYDROCHLOROTHIAZIDE 12.5 MG PO TABS: 12.5000 mg | ORAL_TABLET | Freq: Every day | ORAL | 1 refills | Status: DC

## 2019-03-11 MED ORDER — NICOTINE 21 MG/24HR TD PT24: 21.0000 mg | MEDICATED_PATCH | TRANSDERMAL | 1 refills | Status: DC

## 2019-03-11 MED ORDER — LISINOPRIL 20 MG PO TABS: 20.0000 mg | ORAL_TABLET | Freq: Every day | ORAL | 1 refills | Status: DC

## 2019-03-11 MED ORDER — AMOXICILLIN-POT CLAVULANATE 875-125 MG PO TABS: 1.0000 | ORAL_TABLET | Freq: Two times a day (BID) | ORAL | 0 refills | Status: AC

## 2019-03-11 NOTE — Patient Instructions (Signed)
FOLLOW-UP INSTRUCTIONS When: In December of this year For: a routine visit What to bring: All of your medications  I have continued you on the HCTZ and lisinopril. In addition I have added a one week course of Augmentin, please take this based on the warning signs we discussed.    Today we discussed your dental abscess, your depression, the middle left finger pain, high blood pressure and history of high cholesterol. I will notify you of the results of any labs from today's evaluation when available to me.    Thank you for your visit to the [facility] today. If you have any questions or concerns please call us at [phone]. I will attempt to return your call as soon as possible but it may take 2-3 days for all results and calls to be completed. If it is an emergency, please visit the local ER.

## 2019-03-11 NOTE — Progress Notes (Signed)
   CC: Left finger pain and a dental abscess   HPI:[name] is a 43 y.o. male who presents for evaluation of HTN, anxiety and left middle finger pain. Please see individual problem based A/P for details.  Past Medical History:  Diagnosis Date  . Anxiety disorder   . At risk for obstructive sleep apnea 04/15/2018  . Bipolar 1 disorder ([place])   . Depression   . Hepatitis B immune 04/15/2018  . Schizophrenia ([place])    Review of Systems:  ROS negative except as per HPI.  Physical Exam: Vitals:   03/11/19 1533  BP: 140/70  Pulse: 72  Temp: 98.6 F (37 C)  TempSrc: Oral  SpO2: 99%  Weight: 256 lb 14.4 oz (116.5 kg)   General: In no acute distress, afebrile, nondiaphoretic HEENT: PEERL, EMO intact Cardio: RRR, no mrg's  Pulmonary: CTA bilaterally,  no wheezing or crackles  Abdomen: Bowel sounds normal, soft, nontender  MSK: BLE nontender, nonedematous Neuro: Alert, normal gait Psych: Appropriate affect, not depressed in appearance, engages well  Assessment & Plan:   See Encounters Tab for problem based charting.  Patient discussed with Dr. Angelia Mould

## 2019-03-12 ENCOUNTER — Encounter: Payer: Self-pay | Admitting: Internal Medicine

## 2019-03-12 DIAGNOSIS — K047 Periapical abscess without sinus: Secondary | ICD-10-CM | POA: Insufficient documentation

## 2019-03-12 DIAGNOSIS — E785 Hyperlipidemia, unspecified: Secondary | ICD-10-CM | POA: Insufficient documentation

## 2019-03-12 LAB — LIPID PANEL
Chol/HDL Ratio: 4.6 ratio (ref 0.0–5.0)
Cholesterol, Total: 137 mg/dL (ref 100–199)
HDL: 30 mg/dL — ABNORMAL LOW (ref >39)
LDL Calculated: 78 mg/dL (ref 0–99)
Triglycerides: 147 mg/dL (ref 0–149)
VLDL Cholesterol Cal: 29 mg/dL (ref 5–40)

## 2019-03-12 LAB — BMP8+ANION GAP
Anion Gap: 13 mmol/L (ref 10.0–18.0)
BUN/Creatinine Ratio: 8 — ABNORMAL LOW (ref 9–20)
BUN: 9 mg/dL (ref 6–24)
CO2: 21 mmol/L (ref 20–29)
Calcium: 9.4 mg/dL (ref 8.7–10.2)
Chloride: 105 mmol/L (ref 96–106)
Creatinine, Ser: 1.2 mg/dL (ref 0.76–1.27)
GFR calc Af Amer: 87 mL/min/{1.73_m2} (ref >59)
GFR calc non Af Amer: 75 mL/min/{1.73_m2} (ref >59)
Glucose: 107 mg/dL — ABNORMAL HIGH (ref 65–99)
Potassium: 4.5 mmol/L (ref 3.5–5.2)
Sodium: 139 mmol/L (ref 134–144)

## 2019-03-12 NOTE — Assessment & Plan Note (Signed)
  HLD screening:  Age 43. Hx of HLD per patient. Has a bottle of atorvastatin 40mg  with him from his psychiatrist who he states left town a short while ago.  Plan:  Lipid profile ordered Lipid Panel     Component Value Date/Time   CHOL 137 03/11/2019 1622   TRIG 147 03/11/2019 1622   HDL 30 (L) 03/11/2019 1622   CHOLHDL 4.6 03/11/2019 1622   LDLCALC 78 03/11/2019 1622  I will currently recommend against the continued use of statins in this patient.

## 2019-03-12 NOTE — Assessment & Plan Note (Signed)
  Hypertension: Patient's BP today is 140/70 with a goal of <140/80. The patient endorses adherence to his medication regimen but did run out of the HCTZ three days prior but has been taking the lisinopril 20mg  daily as well. He denied, chest pain, headache, visual changes, lightheadedness, weakness, dizziness on standing, swelling in the feet or ankles.   Plan: Continue lisinopril 20mg  daily Continue HCTZ 12.5mg  daily BMP today at next visit

## 2019-03-12 NOTE — Assessment & Plan Note (Signed)
Provided script for nicoderm.

## 2019-03-12 NOTE — Assessment & Plan Note (Signed)
  Depression, PHQ-9: Based on the patients    Office Visit from 03/11/2019 in [place]  PHQ-9 Total Score  24     score we have 24. He will need to continue to follow with psych, in addition

## 2019-03-12 NOTE — Assessment & Plan Note (Signed)
  Left middle finger pain: Chronic, as described before. Stated that he feels at one point there was metal, or maybe glass in his finger? Also stated that at one point recently he removed something that had sort of a root which upon removal induced severe bleeding. Mild healing central ulceration on exam, no notable wart like structure or other deformity. Nails have been over trimmed.  Maybe the residual effects of a wart vs metal fragment?  Plan: Plain film of the left hand/finger ordered to rule out a metal fragment or bone spur If negative, recommend wrapping the finger tip to decrease irritation  Addendum: Plain film suggesting possible osteomyelitis of the left middle finger, patient with recurrent ulceration at the site. Warrants MRI, order placed

## 2019-03-12 NOTE — Assessment & Plan Note (Signed)
  Dental Infection: Front upper tooth, possibly #7 appears darkened with faint purulent discharge superiorly. He is to see his dentist in August. Denied fever, chills, nausea or vomiting but states that at times he will feel a swelling in the area and then it will burst which results in mild fever, nausea and pain.   Plan:  Likely dental abscess, now ruptured, f/up with dentist Will provide a one week PRN course of Augmentin if this abscess returns as the primary source remains present

## 2019-03-13 NOTE — Progress Notes (Signed)
Internal Medicine Clinic Attending  Case discussed with Dr. Harbrecht at the time of the visit.  We reviewed the resident's history and exam and pertinent patient test results.  I agree with the assessment, diagnosis, and plan of care documented in the resident's note.   

## 2019-03-26 ENCOUNTER — Ambulatory Visit (HOSPITAL_COMMUNITY): Admission: RE | Admit: 2019-03-26 | Payer: Medicaid Other | Source: Ambulatory Visit

## 2019-04-18 ENCOUNTER — Other Ambulatory Visit: Payer: Self-pay | Admitting: Internal Medicine

## 2019-04-18 DIAGNOSIS — F17200 Nicotine dependence, unspecified, uncomplicated: Secondary | ICD-10-CM

## 2019-05-15 ENCOUNTER — Other Ambulatory Visit: Payer: Self-pay | Admitting: Internal Medicine

## 2019-05-15 NOTE — Telephone Encounter (Signed)
I called [facility] - she stated Lisinopril, HCTZ, Sertraline, and Atorvastatin are ready for pick up.  Called pt - talked to his girlfriend who stated they had called and was told otherwise that he needed refills on his psyche med and only 1 med was ready. Heard pt in background, upset b/c the pharmacy told him differently. Informed the girlfriend to call the pharmacy first; if they say his meds are not ready, to call me back.

## 2019-05-15 NOTE — Telephone Encounter (Signed)
Please call patient back about his medication. Patient is requesting a call back today.  Pt calling upset because the  [place] [place] called and told him his medications are ready but when he called back they were there.   hydrochlorothiazide (HYDRODIURIL) 12.5 MG tablethydrochlorothiazide (HYDRODIURIL) 12.5 MG tablet  sertraline (ZOLOFT) 50 MG tabletsertraline (ZOLOFT) 50 MG    lisinopril (ZESTRIL) 20 MG tablet  atorvastatin (LIPITOR) 40 MG tablet

## 2019-05-18 MED ORDER — SERTRALINE HCL 50 MG PO TABS: 50.0000 mg | ORAL_TABLET | Freq: Two times a day (BID) | ORAL | 1 refills | Status: DC

## 2019-05-18 MED ORDER — ATORVASTATIN CALCIUM 40 MG PO TABS: 40.0000 mg | ORAL_TABLET | Freq: Every day | ORAL | 1 refills | Status: DC

## 2019-06-02 ENCOUNTER — Emergency Department (HOSPITAL_COMMUNITY)
Admission: EM | Admit: 2019-06-02 | Discharge: 2019-06-02 | Disposition: A | Discharge status: Home / Self Care | Payer: Medicaid Other | Attending: Emergency Medicine | Admitting: Emergency Medicine

## 2019-06-02 ENCOUNTER — Other Ambulatory Visit: Payer: Self-pay

## 2019-06-02 ENCOUNTER — Emergency Department (HOSPITAL_COMMUNITY): Payer: Medicaid Other

## 2019-06-02 ENCOUNTER — Telehealth: Payer: Self-pay | Admitting: *Deleted

## 2019-06-02 DIAGNOSIS — F17210 Nicotine dependence, cigarettes, uncomplicated: Secondary | ICD-10-CM | POA: Diagnosis not present

## 2019-06-02 DIAGNOSIS — R197 Diarrhea, unspecified: Secondary | ICD-10-CM | POA: Insufficient documentation

## 2019-06-02 DIAGNOSIS — F17220 Nicotine dependence, chewing tobacco, uncomplicated: Secondary | ICD-10-CM | POA: Insufficient documentation

## 2019-06-02 DIAGNOSIS — I10 Essential (primary) hypertension: Secondary | ICD-10-CM | POA: Diagnosis not present

## 2019-06-02 DIAGNOSIS — R739 Hyperglycemia, unspecified: Secondary | ICD-10-CM | POA: Diagnosis not present

## 2019-06-02 DIAGNOSIS — Z79899 Other long term (current) drug therapy: Secondary | ICD-10-CM | POA: Insufficient documentation

## 2019-06-02 DIAGNOSIS — R112 Nausea with vomiting, unspecified: Secondary | ICD-10-CM | POA: Insufficient documentation

## 2019-06-02 DIAGNOSIS — R109 Unspecified abdominal pain: Secondary | ICD-10-CM | POA: Diagnosis present

## 2019-06-02 DIAGNOSIS — R1084 Generalized abdominal pain: Secondary | ICD-10-CM | POA: Diagnosis not present

## 2019-06-02 LAB — COMPREHENSIVE METABOLIC PANEL
ALT: 28 U/L (ref 0–44)
AST: 25 U/L (ref 15–41)
Albumin: 3.7 g/dL (ref 3.5–5.0)
Alkaline Phosphatase: 113 U/L (ref 38–126)
Anion gap: 11 (ref 5–15)
BUN: 10 mg/dL (ref 6–20)
CO2: 22 mmol/L (ref 22–32)
Calcium: 8.8 mg/dL — ABNORMAL LOW (ref 8.9–10.3)
Chloride: 103 mmol/L (ref 98–111)
Creatinine, Ser: 1.29 mg/dL — ABNORMAL HIGH (ref 0.61–1.24)
GFR calc Af Amer: >60 mL/min (ref >60)
GFR calc non Af Amer: >60 mL/min (ref >60)
Glucose, Bld: 177 mg/dL — ABNORMAL HIGH (ref 70–99)
Potassium: 4.5 mmol/L (ref 3.5–5.1)
Sodium: 136 mmol/L (ref 135–145)
Total Bilirubin: 0.5 mg/dL (ref 0.3–1.2)
Total Protein: 7.2 g/dL (ref 6.5–8.1)

## 2019-06-02 LAB — CBC
HCT: 47.6 % (ref 39.0–52.0)
Hemoglobin: 15.3 g/dL (ref 13.0–17.0)
MCH: 27.6 pg (ref 26.0–34.0)
MCHC: 32.1 g/dL (ref 30.0–36.0)
MCV: 85.8 fL (ref 80.0–100.0)
Platelets: 265 10*3/uL (ref 150–400)
RBC: 5.55 MIL/uL (ref 4.22–5.81)
RDW: 14.1 % (ref 11.5–15.5)
WBC: 14.8 10*3/uL — ABNORMAL HIGH (ref 4.0–10.5)
nRBC: 0 % (ref 0.0–0.2)

## 2019-06-02 LAB — RAPID URINE DRUG SCREEN, HOSP PERFORMED
Amphetamines: NOT DETECTED
Barbiturates: NOT DETECTED
Benzodiazepines: NOT DETECTED
Cocaine: NOT DETECTED
Opiates: NOT DETECTED
Tetrahydrocannabinol: POSITIVE — AB

## 2019-06-02 LAB — URINALYSIS, ROUTINE W REFLEX MICROSCOPIC
Bacteria, UA: NONE SEEN
Bilirubin Urine: NEGATIVE
Glucose, UA: 50 mg/dL — AB
Hgb urine dipstick: NEGATIVE
Ketones, ur: NEGATIVE mg/dL
Leukocytes,Ua: NEGATIVE
Nitrite: NEGATIVE
Protein, ur: 30 mg/dL — AB
Specific Gravity, Urine: 1.028 (ref 1.005–1.030)
pH: 9 — ABNORMAL HIGH (ref 5.0–8.0)

## 2019-06-02 LAB — CBG MONITORING, ED: Glucose-Capillary: 119 mg/dL — ABNORMAL HIGH (ref 70–99)

## 2019-06-02 LAB — LIPASE, BLOOD: Lipase: 31 U/L (ref 11–51)

## 2019-06-02 MED ORDER — IOHEXOL 300 MG/ML  SOLN
100.0000 mL | Freq: Once | INTRAMUSCULAR | Status: AC | PRN
  Administered 2019-06-02: 100 mL via INTRAVENOUS

## 2019-06-02 MED ORDER — ONDANSETRON 4 MG PO TBDP: 4.0000 mg | ORAL_TABLET | Freq: Three times a day (TID) | ORAL | 0 refills | Status: DC | PRN

## 2019-06-02 MED ORDER — SODIUM CHLORIDE 0.9% FLUSH: 3.0000 mL | Freq: Once | INTRAVENOUS | Status: DC

## 2019-06-02 MED ORDER — SODIUM CHLORIDE 0.9 % IV BOLUS
1000.0000 mL | Freq: Once | INTRAVENOUS | Status: AC
  Administered 2019-06-02: 1000 mL via INTRAVENOUS

## 2019-06-02 MED ORDER — HYDROMORPHONE HCL 1 MG/ML IJ SOLN
1.0000 mg | Freq: Once | INTRAMUSCULAR | Status: AC
  Administered 2019-06-02: 1 mg via INTRAVENOUS
  Filled 2019-06-02: qty 1

## 2019-06-02 MED ORDER — ONDANSETRON HCL 4 MG/2ML IJ SOLN
4.0000 mg | Freq: Once | INTRAMUSCULAR | Status: AC
  Administered 2019-06-02: 4 mg via INTRAVENOUS
  Filled 2019-06-02: qty 2

## 2019-06-02 NOTE — ED Triage Notes (Addendum)
Pt arrives via EMS from home with nausea/vomiting/abdominal pain since 8am. Pt alert, oriented x4, NAD present. Vss. 139/95, hr 76, rr 16. EKG NSR, w/ 1st degree. 100% RA. cbg 221. Pt also reports infected front tooth w/ puss? 18g LAC, 4mg  zofran.

## 2019-06-02 NOTE — Telephone Encounter (Signed)
Pt sig other calls and states pt is shaking and "throwing up". In the background one can hear pt violently vomiting and moaning. Sig other is advised to call 911 and have pt brought to [place].

## 2019-06-02 NOTE — Discharge Instructions (Signed)
Return if any problems.  Follow up with your Physician for repeat glucose

## 2019-06-02 NOTE — ED Notes (Signed)
Patient verbalizes understanding of discharge instructions. Opportunity for questioning and answers were provided. Armband removed by staff, pt discharged from ED ambulatory.   

## 2019-06-03 NOTE — ED Provider Notes (Signed)
[facility] EMERGENCY DEPARTMENT Provider Note   CSN: [id number] Arrival date & time: 06/02/19  1121     History   Chief Complaint Chief Complaint  Patient presents with  . Abdominal Pain  . Emesis    HPI Bruce [name] is a 43 y.o. male.     The history is provided by the patient and a relative. No language interpreter was used.  Abdominal Pain Pain location:  Generalized Pain quality: aching   Pain radiates to:  Does not radiate Pain severity:  Severe Onset quality:  Gradual Duration:  1 day Timing:  Constant Progression:  Worsening Chronicity:  New Context: suspicious food intake   Context: not alcohol use   Relieved by:  Nothing Worsened by:  Nothing Ineffective treatments:  None tried Associated symptoms: vomiting   Risk factors: no aspirin use   Emesis Associated symptoms: abdominal pain     Past Medical History:  Diagnosis Date  . Anxiety disorder   . At risk for obstructive sleep apnea 04/15/2018  . Bipolar 1 disorder (HCC)   . Depression   . Hepatitis B immune 04/15/2018  . Schizophrenia [facility])     Patient Active Problem List   Diagnosis Date Noted  . HLD (hyperlipidemia) 03/12/2019  . 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 Finger pain, left 01/20/2019  . HTN (hypertension) 11/12/2018  . Tobacco use disorder 05/20/2018  . Schizophrenia (HCC) 04/15/2018  . Bipolar disorder (HCC) 04/15/2018  . Anxiety disorder 04/15/2018  . Depression 04/15/2018  . Gastroesophageal reflux disease 04/15/2018    Past Surgical History:  Procedure Laterality Date  . wisdom teth removal          Home Medications    Prior to Admission medications   Medication Sig Start Date End Date Taking? Authorizing Provider  acetaminophen (TYLENOL) 500 MG tablet Take 500 mg by mouth every 6 (six) hours as needed (pain).    [provider]  atorvastatin (LIPITOR) 40 MG tablet Take 1 tablet (40 mg total)  by mouth daily. 05/18/19   Lanelle Bal, MD  hydrochlorothiazide (HYDRODIURIL) 12.5 MG tablet Take 1 tablet (12.5 mg total) by mouth daily. 03/11/19   Lanelle Bal, MD  lisinopril (ZESTRIL) 20 MG tablet Take 1 tablet (20 mg total) by mouth daily. 03/11/19 03/10/20  Lanelle Bal, MD  nicotine (NICODERM CQ - DOSED IN MG/24 HOURS) 21 mg/24hr patch PLACE 1 PATCH (21 MG TOTAL) ONTO THE SKIN DAILY. 04/20/19 04/19/20  Lanelle Bal, MD  nicotine polacrilex (NICORETTE) 4 MG gum Take 1 each (4 mg total) by mouth as needed for smoking cessation. 11/12/18   Lanelle Bal, MD  ondansetron (ZOFRAN ODT) 4 MG disintegrating tablet Take 1 tablet (4 mg total) by mouth every 8 (eight) hours as needed for nausea or vomiting. 06/02/19   Elson Areas, PA-C  sertraline (ZOLOFT) 50 MG tablet Take 1 tablet (50 mg total) by mouth 2 (two) times daily. 05/18/19   Lanelle Bal, MD    Family History Family History  Problem Relation Age of Onset  . Diabetes Mother   . Hypertension Mother   . Prostate cancer Father   . Diabetes Sister   . Migraines Sister     Social History Social History   Tobacco Use  . Smoking status: Current Every Day Smoker    Packs/day: 0.50    Types: Cigarettes  . Smokeless tobacco: Current User  . Tobacco comment: previous marijuana smoker  Substance Use Topics  .  Alcohol use: Not on file  . Drug use: No     Allergies   Patient has no known allergies.   Review of Systems Review of Systems  Gastrointestinal: Positive for abdominal pain and vomiting.  All other systems reviewed and are negative.    Physical Exam Updated Vital Signs BP 119/65   Pulse 66   Temp 97.9 F (36.6 C) (Oral)   Resp 16   SpO2 99%   Physical Exam Vitals signs reviewed.  Constitutional:      Appearance: He is well-developed.  HENT:     Head: Normocephalic.     Mouth/Throat:     Mouth: Mucous membranes are moist.  Eyes:     Extraocular Movements:  Extraocular movements intact.  Cardiovascular:     Rate and Rhythm: Normal rate.     Heart sounds: Normal heart sounds.  Pulmonary:     Effort: Pulmonary effort is normal.  Abdominal:     General: Abdomen is flat. Bowel sounds are normal. There is distension.     Palpations: Abdomen is soft.     Tenderness: There is generalized abdominal tenderness and tenderness in the epigastric area and suprapubic area.     Hernia: No hernia is present.  Skin:    General: Skin is warm.  Neurological:     General: No focal deficit present.     Mental Status: He is alert.  Psychiatric:        Mood and Affect: Mood normal.      ED Treatments / Results  Labs (all labs ordered are listed, but only abnormal results are displayed) Labs Reviewed  COMPREHENSIVE METABOLIC PANEL - Abnormal; Notable for the following components:      Result Value   Glucose, Bld 177 (*)    Creatinine, Ser 1.29 (*)    Calcium 8.8 (*)    All other components within normal limits  CBC - Abnormal; Notable for the following components:   WBC 14.8 (*)    All other components within normal limits  URINALYSIS, ROUTINE W REFLEX MICROSCOPIC - Abnormal; Notable for the following components:   pH 9.0 (*)    Glucose, UA 50 (*)    Protein, ur 30 (*)    All other components within normal limits  RAPID URINE DRUG SCREEN, HOSP PERFORMED - Abnormal; Notable for the following components:   Tetrahydrocannabinol POSITIVE (*)    All other components within normal limits  CBG MONITORING, ED - Abnormal; Notable for the following components:   Glucose-Capillary 119 (*)    All other components within normal limits  LIPASE, BLOOD    EKG None  Radiology Ct Abdomen Pelvis W Contrast  Result Date: 06/02/2019 CLINICAL DATA:  Epigastric pain and nausea and vomiting. Elevated white blood count. EXAM: CT ABDOMEN AND PELVIS WITH CONTRAST TECHNIQUE: Multidetector CT imaging of the abdomen and pelvis was performed using the standard  protocol following bolus administration of intravenous contrast. CONTRAST:  193mL OMNIPAQUE IOHEXOL 300 MG/ML  SOLN COMPARISON:  None. FINDINGS: Lower chest: No acute abnormality. Hepatobiliary: No focal liver abnormality is seen. No gallstones, gallbladder wall thickening, or biliary dilatation. Pancreas: Unremarkable. No pancreatic ductal dilatation or surrounding inflammatory changes. Spleen: Normal in size without focal abnormality. Adrenals/Urinary Tract: Adrenal glands are unremarkable. Kidneys are normal, without renal calculi, focal lesion, or hydronephrosis. Bladder is unremarkable. Stomach/Bowel: Stomach is within normal limits. Appendix appears normal. No evidence of bowel wall thickening, distention, or inflammatory changes. Vascular/Lymphatic: No significant vascular findings are present. No enlarged  abdominal or pelvic lymph nodes. Reproductive: Prostate is unremarkable. Other: No abdominal wall hernia or abnormality. No abdominopelvic ascites. Musculoskeletal: No acute abnormality. Degenerative disc disease at L5-S1. IMPRESSION: Benign-appearing abdomen and pelvis. Electronically Signed   By: Francene BoyersJames  Maxwell M.D.   On: 06/02/2019 14:11    Procedures Procedures (including critical care time)  Medications Ordered in ED Medications  sodium chloride 0.9 % bolus 1,000 mL (0 mLs Intravenous Stopped 06/02/19 1618)  HYDROmorphone (DILAUDID) injection 1 mg (1 mg Intravenous Given 06/02/19 1303)  ondansetron (ZOFRAN) injection 4 mg (4 mg Intravenous Given 06/02/19 1301)  iohexol (OMNIPAQUE) 300 MG/ML solution 100 mL (100 mLs Intravenous Contrast Given 06/02/19 1344)  sodium chloride 0.9 % bolus 1,000 mL (0 mLs Intravenous Stopped 06/02/19 1618)     Initial Impression / Assessment and Plan / ED Course  I have reviewed the triage vital signs and the nursing notes.  Pertinent labs & imaging results that were available during my care of the patient were reviewed by me and considered in my medical  decision making (see chart for details).        MDM   Pt very uncomfortable,  Pt given zofran and pain medication,  Ct abdomen obtained,  No acute abnormality,  Labs reviewed. I suspect viral gi illness, doubt covid.    Final Clinical Impressions(s) / ED Diagnoses   Final diagnoses:  Generalized abdominal pain  Nausea vomiting and diarrhea  Hyperglycemia    ED Discharge Orders         Ordered    ondansetron (ZOFRAN ODT) 4 MG disintegrating tablet  Every 8 hours PRN     06/02/19 1559        An After Visit Summary was printed and given to the patient.    Elson AreasSofia,  K, [place] JerseyPA-C 06/03/19 1237    Terald Sleeperrifan, Matthew J, MD 06/03/19 [id number]

## 2019-06-20 ENCOUNTER — Other Ambulatory Visit: Payer: Self-pay | Admitting: Internal Medicine

## 2019-06-20 DIAGNOSIS — F17200 Nicotine dependence, unspecified, uncomplicated: Secondary | ICD-10-CM

## 2019-07-08 ENCOUNTER — Encounter: Payer: Medicaid Other | Admitting: Internal Medicine

## 2019-07-08 ENCOUNTER — Encounter: Payer: Self-pay | Admitting: Internal Medicine

## 2019-07-15 ENCOUNTER — Other Ambulatory Visit: Payer: Self-pay

## 2019-07-15 ENCOUNTER — Encounter: Payer: Self-pay | Admitting: Internal Medicine

## 2019-07-15 ENCOUNTER — Ambulatory Visit: Payer: Medicaid Other | Admitting: Internal Medicine

## 2019-07-15 DIAGNOSIS — H6192 Disorder of left external ear, unspecified: Secondary | ICD-10-CM | POA: Insufficient documentation

## 2019-07-15 NOTE — Progress Notes (Signed)
Internal Medicine Clinic Attending  Case discussed with Dr. Santos-Sanchez at the time of the visit.  We reviewed the resident's history and exam and pertinent patient test results.  I agree with the assessment, diagnosis, and plan of care documented in the resident's note.    

## 2019-07-15 NOTE — Patient Instructions (Addendum)
Bruce [name],   Continue using warm compresses to your ear.  I suspect as well as cannot go down and fully resolve within the next few days.  If you notice worsening pain, fever, chills, redness in the area, or discharge please give [place] a call let us know so we can prescribe you an antibiotic.  - Dr. Frederico Hamman

## 2019-07-15 NOTE — Assessment & Plan Note (Signed)
Patient presents for evaluation of left earlobe "bump" that he noticed 2 days ago.  Has not noticed any erythema, warmth, or discharge from this lesion.  He also denies fevers, chills, ear pain, tinnitus, and hearing loss.  He use warm compresses last night he reports the lesion decreased in size significantly.  On exam there is a small scab on the earlobe with very mild induration surrounding it but no signs of infection noted. EOM appears normal as well.  I suspect this is mild dermal irritation from minor trauma to the earlobe (possibly a scratch) that is already healing. Recommended continuing warm compresses for a few more days. Advised to call if he experiences fever, new pain, erythema, warmth or discharge.

## 2019-07-15 NOTE — Progress Notes (Signed)
   CC: L ear lesion   HPI:  Mr.Chaney Sherrin is a 43 y.o. year-old male with PMH listed below who presents to clinic for L ear lesion. Please see problem based assessment and plan for further details.   Past Medical History:  Diagnosis Date  . Anxiety disorder   . At risk for obstructive sleep apnea 04/15/2018  . Bipolar 1 disorder ([place])   . Depression   . Hepatitis B immune 04/15/2018  . Schizophrenia ([place])    Review of Systems:   Review of Systems  Constitutional: Negative for chills and fever.  HENT: Negative for ear discharge, ear pain, hearing loss and tinnitus.      Physical Exam:  There were no vitals filed for this visit.  General: well-appearing male in NAD  Ear: L ear appears grossly normal externally, there is a very small scab over earlobe associated with mild induration around it. There is no induration, warmth, erythema or discharge. No tenderness to palpation. EAM without erythema, TM clear with no fluid noted.    Assessment & Plan:   See Encounters Tab for problem based charting.  Patient discussed with Dr. Evette Doffing

## 2019-07-27 ENCOUNTER — Other Ambulatory Visit: Payer: Self-pay | Admitting: Internal Medicine

## 2019-07-27 DIAGNOSIS — I10 Essential (primary) hypertension: Secondary | ICD-10-CM

## 2019-10-07 ENCOUNTER — Ambulatory Visit: Payer: Medicaid Other

## 2019-10-13 ENCOUNTER — Ambulatory Visit: Payer: Medicaid Other | Admitting: Internal Medicine

## 2019-10-13 ENCOUNTER — Encounter: Payer: Self-pay | Admitting: Internal Medicine

## 2019-10-13 VITALS — BP 132/83 | HR 99 | Temp 89.7°F | Ht 77.0 in | Wt 260.2 lb

## 2019-10-13 DIAGNOSIS — R42 Dizziness and giddiness: Secondary | ICD-10-CM

## 2019-10-13 DIAGNOSIS — R61 Generalized hyperhidrosis: Secondary | ICD-10-CM | POA: Diagnosis not present

## 2019-10-13 DIAGNOSIS — M79645 Pain in left finger(s): Secondary | ICD-10-CM

## 2019-10-13 DIAGNOSIS — K047 Periapical abscess without sinus: Secondary | ICD-10-CM | POA: Diagnosis not present

## 2019-10-13 DIAGNOSIS — R519 Headache, unspecified: Secondary | ICD-10-CM | POA: Diagnosis not present

## 2019-10-13 DIAGNOSIS — K029 Dental caries, unspecified: Secondary | ICD-10-CM | POA: Diagnosis not present

## 2019-10-13 DIAGNOSIS — G8929 Other chronic pain: Secondary | ICD-10-CM | POA: Insufficient documentation

## 2019-10-13 MED ORDER — AMOXICILLIN-POT CLAVULANATE 875-125 MG PO TABS: 1.0000 | ORAL_TABLET | Freq: Two times a day (BID) | ORAL | 0 refills | Status: AC

## 2019-10-13 NOTE — Assessment & Plan Note (Signed)
[name] presents today with a fully healed middle left finger.  No indication for further intervention at this time but encouraged patient to please make office visit if any sign of infection redevelops in his fingers.

## 2019-10-13 NOTE — Assessment & Plan Note (Signed)
Bruce [name] describes a chronic history of severe headaches that come on suddenly, generally triggered by even light exertion.  He describes the headaches as a pounding sensation that goes around all of his head and includes the back of his neck.  He generally feels dizzy at the same time.  He takes Tylenol for these episodes and it does alleviate his symptoms usually however if they occur at night he generally just tries to fall asleep.  No previous work-up for this.  Differential includes tension headaches versus complex migraines. This issue will warrant further investigation, however this visit was focused on his acute issues. Provided patient with headache diary and encouraged him to keep track of both his headaches and dizziness  Plan:  -Follow-up with PCP for further work-up of chronic issues -Headache diary
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
## 2019-10-13 NOTE — Assessment & Plan Note (Addendum)
Mr. Lusby describes a several year history of nightly night sweats that caused him to drench the sheets completely.  He has never been worked up for this previously.  He does state he had a TB test many years ago that was negative though.  He denies any unexplained weight loss.  This issue will warrant further investigation, however this visit was focused on his acute issues.  I will order TB PPD at this visit though.  Plan: -PPD placed -Follow-up with PCP for further work-up of chronic issues

## 2019-10-13 NOTE — Patient Instructions (Addendum)
It was nice seeing you today! Thank you for choosing [facility] for your Primary Care.    Today we talked about:   1. Dental Infection: I sent in a prescription to your pharmacy   2. Headaches with Dizziness: It will help [place] get a better idea about causes if we had a journal detailing when the headaches happen. I printed one out and attached it to your packet.   3. Finger Infection: For now, no more work up is needed. If you develop a new infection though, please call the clinic to be seen.

## 2019-10-13 NOTE — Progress Notes (Signed)
   CC: dental drainage  HPI:  [name] is a 44 y.o. with a PMHx as listed below who presents to the clinic for dental drainage.   Please see the Encounters tab for problem-based Assessment & Plan regarding status of patient's chronic conditions.  Past Medical History:  Diagnosis Date  . Anxiety disorder   . At risk for obstructive sleep apnea 04/15/2018  . Bipolar 1 disorder (HCC)   . Depression   . Hepatitis B immune 04/15/2018  . Schizophrenia (HCC)    Review of Systems: Review of Systems  Constitutional: Positive for fever (chronic? ). Negative for weight loss.       Night sweats, chronic  HENT: Positive for sore throat.        Dental pain with yellow drainage  Respiratory: Negative for cough, hemoptysis and shortness of breath.   Cardiovascular: Negative for chest pain and palpitations.  Gastrointestinal: Positive for nausea. Negative for abdominal pain, constipation, diarrhea, heartburn and vomiting.  Musculoskeletal: Negative for falls.  Neurological: Positive for dizziness (chronic) and headaches (chronic). Negative for focal weakness and loss of consciousness.  Psychiatric/Behavioral: The patient is nervous/anxious.    Physical Exam:  Vitals:   10/13/19 1445  BP: 132/83  Pulse: 99  Temp: (!) 89.7 F (32.1 C)  TempSrc: Oral  SpO2: 100%  Weight: 260 lb 3.2 oz (118 kg)  Height: 6\' 5"  (1.956 m)   Physical Exam Vitals and nursing note reviewed.  Constitutional:      General: He is not in acute distress.    Appearance: He is normal weight.  HENT:     Head:     Jaw: No tenderness, swelling or pain on movement.     Mouth/Throat:     Dentition: Abnormal dentition. Dental caries present. No dental abscesses.     Pharynx: Oropharynx is clear.   Pulmonary:     Effort: Pulmonary effort is normal. No respiratory distress.  Skin:    General: Skin is warm and dry.  Neurological:     Mental Status: He is alert and oriented to person, place, and time. Mental  status is at baseline.     Gait: Gait normal.  Psychiatric:        Mood and Affect: Mood normal.        Behavior: Behavior normal.    Assessment & Plan:   See Encounters Tab for problem based charting.  Patient discussed with Dr. 

## 2019-10-13 NOTE — Assessment & Plan Note (Signed)
Bruce [name] presents today with several day history of yellow drainage from front right upper tooth that has caused significant discomfort and nausea.  He has an appointment with the dentist on March 17.  He denies any acute fever, chills but states he has general malaise.  No obvious abscess on examination but given that the site has been previously infected and he is having yellow puslike discharge, will start patient on 1 week of Augmentin.  Plan: -Augmentin 875-125 mg twice daily for 7 days

## 2019-10-14 ENCOUNTER — Other Ambulatory Visit: Payer: Self-pay

## 2019-10-14 DIAGNOSIS — Z20822 Contact with and (suspected) exposure to covid-19: Secondary | ICD-10-CM

## 2019-10-14 NOTE — Progress Notes (Signed)
Internal Medicine Clinic Attending  Case discussed with Dr. Basaraba at the time of the visit.  We reviewed the resident's history and exam and pertinent patient test results.  I agree with the assessment, diagnosis, and plan of care documented in the resident's note.    

## 2019-10-15 ENCOUNTER — Ambulatory Visit: Payer: Medicaid Other

## 2019-10-15 LAB — TB SKIN TEST
Induration: 0 mm
TB Skin Test: NEGATIVE

## 2019-10-15 LAB — NOVEL CORONAVIRUS, NAA: SARS-CoV-2, NAA: NOT DETECTED

## 2019-11-09 ENCOUNTER — Other Ambulatory Visit: Payer: Self-pay | Admitting: Internal Medicine

## 2019-11-09 DIAGNOSIS — E785 Hyperlipidemia, unspecified: Secondary | ICD-10-CM

## 2019-11-09 DIAGNOSIS — I10 Essential (primary) hypertension: Secondary | ICD-10-CM

## 2019-11-16 ENCOUNTER — Emergency Department (HOSPITAL_COMMUNITY)
Admission: EM | Admit: 2019-11-16 | Discharge: 2019-11-16 | Disposition: A | Discharge status: Home / Self Care | Payer: Medicaid Other | Attending: Emergency Medicine | Admitting: Emergency Medicine

## 2019-11-16 ENCOUNTER — Other Ambulatory Visit: Payer: Self-pay

## 2019-11-16 ENCOUNTER — Emergency Department (HOSPITAL_COMMUNITY): Payer: Medicaid Other

## 2019-11-16 ENCOUNTER — Telehealth: Payer: Self-pay | Admitting: *Deleted

## 2019-11-16 ENCOUNTER — Encounter (HOSPITAL_COMMUNITY): Payer: Self-pay

## 2019-11-16 DIAGNOSIS — R112 Nausea with vomiting, unspecified: Secondary | ICD-10-CM | POA: Diagnosis not present

## 2019-11-16 DIAGNOSIS — F17210 Nicotine dependence, cigarettes, uncomplicated: Secondary | ICD-10-CM | POA: Diagnosis not present

## 2019-11-16 DIAGNOSIS — I10 Essential (primary) hypertension: Secondary | ICD-10-CM | POA: Diagnosis not present

## 2019-11-16 DIAGNOSIS — K529 Noninfective gastroenteritis and colitis, unspecified: Secondary | ICD-10-CM

## 2019-11-16 DIAGNOSIS — Z79899 Other long term (current) drug therapy: Secondary | ICD-10-CM | POA: Insufficient documentation

## 2019-11-16 DIAGNOSIS — R109 Unspecified abdominal pain: Secondary | ICD-10-CM | POA: Diagnosis present

## 2019-11-16 LAB — COMPREHENSIVE METABOLIC PANEL
ALT: 29 U/L (ref 0–44)
AST: 30 U/L (ref 15–41)
Albumin: 4.6 g/dL (ref 3.5–5.0)
Alkaline Phosphatase: 121 U/L (ref 38–126)
Anion gap: 12 (ref 5–15)
BUN: 11 mg/dL (ref 6–20)
CO2: 22 mmol/L (ref 22–32)
Calcium: 9.6 mg/dL (ref 8.9–10.3)
Chloride: 103 mmol/L (ref 98–111)
Creatinine, Ser: 1.22 mg/dL (ref 0.61–1.24)
GFR calc Af Amer: >60 mL/min (ref >60)
GFR calc non Af Amer: >60 mL/min (ref >60)
Glucose, Bld: 140 mg/dL — ABNORMAL HIGH (ref 70–99)
Potassium: 3.9 mmol/L (ref 3.5–5.1)
Sodium: 137 mmol/L (ref 135–145)
Total Bilirubin: 1.1 mg/dL (ref 0.3–1.2)
Total Protein: 8.5 g/dL — ABNORMAL HIGH (ref 6.5–8.1)

## 2019-11-16 LAB — URINALYSIS, ROUTINE W REFLEX MICROSCOPIC
Bacteria, UA: NONE SEEN
Bilirubin Urine: NEGATIVE
Glucose, UA: NEGATIVE mg/dL
Hgb urine dipstick: NEGATIVE
Ketones, ur: NEGATIVE mg/dL
Leukocytes,Ua: NEGATIVE
Nitrite: NEGATIVE
Protein, ur: 100 mg/dL — AB
Specific Gravity, Urine: 1.025 (ref 1.005–1.030)
pH: 8 (ref 5.0–8.0)

## 2019-11-16 LAB — CBC
HCT: 46.7 % (ref 39.0–52.0)
Hemoglobin: 15.5 g/dL (ref 13.0–17.0)
MCH: 27.9 pg (ref 26.0–34.0)
MCHC: 33.2 g/dL (ref 30.0–36.0)
MCV: 84 fL (ref 80.0–100.0)
Platelets: 301 10*3/uL (ref 150–400)
RBC: 5.56 MIL/uL (ref 4.22–5.81)
RDW: 14.6 % (ref 11.5–15.5)
WBC: 16.8 10*3/uL — ABNORMAL HIGH (ref 4.0–10.5)
nRBC: 0 % (ref 0.0–0.2)

## 2019-11-16 LAB — LIPASE, BLOOD: Lipase: 21 U/L (ref 11–51)

## 2019-11-16 MED ORDER — ONDANSETRON 8 MG PO TBDP: 8.0000 mg | ORAL_TABLET | Freq: Three times a day (TID) | ORAL | 0 refills | Status: DC | PRN

## 2019-11-16 MED ORDER — ONDANSETRON HCL 4 MG/2ML IJ SOLN
4.0000 mg | Freq: Once | INTRAMUSCULAR | Status: AC
  Administered 2019-11-16: 4 mg via INTRAVENOUS
  Filled 2019-11-16: qty 2

## 2019-11-16 MED ORDER — ONDANSETRON 4 MG PO TBDP
4.0000 mg | ORAL_TABLET | Freq: Once | ORAL | Status: AC | PRN
  Administered 2019-11-16: 4 mg via ORAL
  Filled 2019-11-16: qty 1

## 2019-11-16 MED ORDER — MORPHINE SULFATE (PF) 4 MG/ML IV SOLN
8.0000 mg | Freq: Once | INTRAVENOUS | Status: AC
  Administered 2019-11-16: 8 mg via INTRAVENOUS
  Filled 2019-11-16: qty 2

## 2019-11-16 MED ORDER — SODIUM CHLORIDE 0.9% FLUSH
3.0000 mL | Freq: Once | INTRAVENOUS | Status: AC
  Administered 2019-11-16: 21:00:00 3 mL via INTRAVENOUS

## 2019-11-16 NOTE — ED Notes (Addendum)
Pt given labeled urine cup to provide urine sample when able

## 2019-11-16 NOTE — ED Provider Notes (Addendum)
[facility]-EMERGENCY DEPT Provider Note   CSN: [id number] Arrival date & time: 11/16/19  1518     History Chief Complaint  Patient presents with  . Abdominal Pain  . Emesis    Bruce [name] is a 44 y.o. male.  HPI     44 year old male comes in a chief complaint of abdominal pain.  Patient reports that around 10 AM this morning he started having abdominal pain.  The pain is generalized and nonradiating.  He describes the pain as sharp pain with associated nausea and vomiting and diarrhea.   Patient denies any heavy alcohol use but does admit to heavy marijuana use. He has no history of abdominal surgery.  Review of system is negative for any UTI-like symptoms, fevers, chills.   Past Medical History:  Diagnosis Date  . Anxiety disorder   . At risk for obstructive sleep apnea 04/15/2018  . Bipolar 1 disorder (HCC)   . Depression   . Hepatitis B immune 04/15/2018  . Schizophrenia [facility])     Patient Active Problem List   Diagnosis Date Noted  . Unexplained night sweats 10/13/2019  . Chronic headaches 10/13/2019  . Dizziness 10/13/2019  . Earlobe lesion, left 07/15/2019  . HLD (hyperlipidemia) 03/12/2019  . 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 Finger pain, left 01/20/2019  . HTN (hypertension) 11/12/2018  . Tobacco use disorder 05/20/2018  . Schizophrenia (HCC) 04/15/2018  . Bipolar disorder (HCC) 04/15/2018  . Anxiety disorder 04/15/2018  . Depression 04/15/2018  . Gastroesophageal reflux disease 04/15/2018    Past Surgical History:  Procedure Laterality Date  . wisdom teth removal         Family History  Problem Relation Age of Onset  . Diabetes Mother   . Hypertension Mother   . Prostate cancer Father   . Diabetes Sister   . Migraines Sister     Social History   Tobacco Use  . Smoking status: Current Every Day Smoker    Packs/day: 0.50    Types: Cigarettes  . Smokeless tobacco: Never  Used  . Tobacco comment: previous marijuana smoker  Substance Use Topics  . Alcohol use: Not Currently  . Drug use: No    Home Medications Prior to Admission medications   Medication Sig Start Date End Date Taking? Authorizing Provider  acetaminophen (TYLENOL) 500 MG tablet Take 500 mg by mouth every 6 (six) hours as needed (pain).    [provider]  atorvastatin (LIPITOR) 40 MG tablet TAKE 1 TABLET (40 MG TOTAL) BY MOUTH DAILY. 11/10/19   Lanelle Bal, MD  hydrochlorothiazide (HYDRODIURIL) 12.5 MG tablet TAKE 1 TABLET (12.5 MG TOTAL) BY MOUTH DAILY. 07/27/19   Lanelle Bal, MD  lisinopril (ZESTRIL) 20 MG tablet TAKE 1 TABLET (20 MG TOTAL) BY MOUTH DAILY. 11/10/19 11/09/20  Lanelle Bal, MD  nicotine (NICODERM CQ - DOSED IN MG/24 HOURS) 21 mg/24hr patch PLACE 1 PATCH (21 MG TOTAL) ONTO THE SKIN DAILY. 06/23/19 06/22/20  Lanelle Bal, MD  nicotine polacrilex (NICORETTE) 4 MG gum Take 1 each (4 mg total) by mouth as needed for smoking cessation. 11/12/18   Lanelle Bal, MD  ondansetron (ZOFRAN ODT) 8 MG disintegrating tablet Take 1 tablet (8 mg total) by mouth every 8 (eight) hours as needed for nausea. 11/16/19   Derwood Kaplan, MD  sertraline (ZOLOFT) 50 MG tablet Take 1 tablet (50 mg total) by mouth 2 (two) times daily. 05/18/19   Lanelle Bal, MD  Allergies    Patient has no known allergies.  Review of Systems   Review of Systems  Constitutional: Positive for activity change.  Respiratory: Negative for shortness of breath.   Cardiovascular: Negative for chest pain.  Gastrointestinal: Positive for abdominal pain, diarrhea, nausea and vomiting.  Allergic/Immunologic: Negative for immunocompromised state.  All other systems reviewed and are negative.   Physical Exam Updated Vital Signs BP (!) 143/93 (BP Location: Left Arm)   Pulse 66   Temp 97.9 F (36.6 C) (Oral)   Resp 16   Ht 6\' 4"  (1.93 m)   Wt 113.4 kg   SpO2 96%   BMI 30.43  kg/m   Physical Exam Vitals and nursing note reviewed.  Constitutional:      Appearance: He is well-developed.  HENT:     Head: Normocephalic and atraumatic.  Eyes:     Conjunctiva/sclera: Conjunctivae normal.     Pupils: Pupils are equal, round, and reactive to light.  Cardiovascular:     Rate and Rhythm: Normal rate and regular rhythm.  Pulmonary:     Effort: Pulmonary effort is normal.     Breath sounds: Normal breath sounds.  Abdominal:     General: Bowel sounds are normal. There is no distension.     Palpations: Abdomen is soft. There is no mass.     Tenderness: There is generalized abdominal tenderness. There is no guarding or rebound.  Musculoskeletal:        General: No deformity.     Cervical back: Normal range of motion and neck supple.  Skin:    General: Skin is warm.     Capillary Refill: Capillary refill takes less than 2 seconds.  Neurological:     Mental Status: He is alert and oriented to person, place, and time.     ED Results / Procedures / Treatments   Labs (all labs ordered are listed, but only abnormal results are displayed) Labs Reviewed  COMPREHENSIVE METABOLIC PANEL - Abnormal; Notable for the following components:      Result Value   Glucose, Bld 140 (*)    Total Protein 8.5 (*)    All other components within normal limits  CBC - Abnormal; Notable for the following components:   WBC 16.8 (*)    All other components within normal limits  URINALYSIS, ROUTINE W REFLEX MICROSCOPIC - Abnormal; Notable for the following components:   Protein, ur 100 (*)    All other components within normal limits  LIPASE, BLOOD    EKG None  Radiology US Abdomen Limited  Result Date: 11/16/2019 CLINICAL DATA:  Right upper quadrant abdominal pain EXAM: ULTRASOUND ABDOMEN LIMITED RIGHT UPPER QUADRANT COMPARISON:  CT abdomen/pelvis dated 06/02/2019 FINDINGS: Gallbladder: No gallstones, gallbladder wall thickening, or pericholecystic fluid. Negative sonographic  Murphy's sign. Common bile duct: Diameter: 6 mm Liver: No focal lesion identified. At the upper limits of normal for parenchymal echogenicity. Portal vein is patent on color Doppler imaging with normal direction of blood flow towards the liver. Other: None. IMPRESSION: Negative right upper quadrant ultrasound. Electronically Signed   By: Julian Hy M.D.   On: 11/16/2019 22:19    Procedures Procedures (including critical care time)  Medications Ordered in ED Medications  sodium chloride flush (NS) 0.9 % injection 3 mL (3 mLs Intravenous Given 11/16/19 2059)  ondansetron (ZOFRAN-ODT) disintegrating tablet 4 mg (4 mg Oral Given 11/16/19 1540)  ondansetron (ZOFRAN) injection 4 mg (4 mg Intravenous Given 11/16/19 2058)  morphine 4 MG/ML injection 8 mg (  8 mg Intravenous Given 11/16/19 2058)    ED Course  I have reviewed the triage vital signs and the nursing notes.  Pertinent labs & imaging results that were available during my care of the patient were reviewed by me and considered in my medical decision making (see chart for details).  Clinical Course as of Nov 15 2317  Mon Nov 16, 2019  2316 Patient's pain has resolved completely. Ultrasound is reassuring. UA is clean. Besides elevated white count, rest of the labs are reassuring.  White count is likely reactive. P.o. challenge passed. Patient is stable for discharge with strict ER return precautions.   [AN]    Clinical Course User Index [AN] Derwood Kaplan, MD   MDM Rules/Calculators/A&P                      DDx includes: Pancreatitis Hepatobiliary pathology including cholecystitis Gastritis/PUD SBO Cyclic vomiting syndrome Hyperemesis cannabinoid syndrome  Patient comes in a chief complaint of abdominal pain that is generalized on exam but without any peritoneal findings. White count is elevated.  Patient reports some nausea, vomiting and diarrhea.  No hematemesis or melena.  Patient is not profoundly  dehydrated.  Other possibility includes COVID-19 related GI symptoms.  We will get ultrasound as he reports the pain primarily is in the epigastric and right upper quadrant.  If the ultrasound right upper quadrant negative then we will start p.o. challenge.  If patient passes oral challenge and will be discharged.  Otherwise we will do further testing.  Bruce [name] was evaluated in Emergency Department on 11/16/2019 for the symptoms described in the history of present illness. He was evaluated in the context of the global COVID-19 pandemic, which necessitated consideration that the patient might be at risk for infection with the SARS-CoV-2 virus that causes COVID-19. Institutional protocols and algorithms that pertain to the evaluation of patients at risk for COVID-19 are in a state of rapid change based on information released by regulatory bodies including the CDC and federal and state organizations. These policies and algorithms were followed during the patient's care in the ED.   Final Clinical Impression(s) / ED Diagnoses Final diagnoses:  Gastroenteritis  Non-intractable vomiting with nausea, unspecified vomiting type    Rx / DC Orders ED Discharge Orders         Ordered    ondansetron (ZOFRAN ODT) 8 MG disintegrating tablet  Every 8 hours PRN     11/16/19 2318            Derwood Kaplan, MD 11/16/19 2336

## 2019-11-16 NOTE — ED Triage Notes (Signed)
Per EMS- Patient c/o mid upper abdominal pain, diarrhea, and emesis since 1000 today. Patient states he had Pepto Bismol with some relief.

## 2019-11-16 NOTE — Discharge Instructions (Addendum)
All the results in the ER are normal, labs and imaging. We are not sure what is causing your symptoms.  There could be a possible connection to increase marijuana use.  The workup in the ER is not complete, and is limited to screening for life threatening and emergent conditions only, so please see a primary care doctor for further evaluation.

## 2019-11-16 NOTE — Telephone Encounter (Signed)
Bruce [name], Thank you for the notification. I am sorry for the frustration expressed by Mr. Mortons wife. It appears that she was distressed by the severity of his symptoms. It looks like he was seen by the ED triage nurse. I agree that he needed to go to the ER given the severity of his symptoms.

## 2019-11-16 NOTE — Telephone Encounter (Signed)
Male calls and states she is his wife,  that pt is throwing up and crying out"rolling around in floor". She states she cannot take him to ED. rn can hear pt in background crying and moaning. She states she wants someone to walk her through doing what needs to be done for him because she cannot leave her children. She starts naming off his meds, rn states his med list does not have remeron on it and starts to say she will need to call 911 when she gets very upset and states "I know what medicine he is on, you may not but I do and I just need you to tell me what to do to take care of him" at this point rn states please call 911 mr Prindle cannot be treated over the phone. The wife is upset and call is ended

## 2019-11-18 ENCOUNTER — Encounter: Payer: Medicaid Other | Admitting: Internal Medicine

## 2019-11-18 NOTE — Progress Notes (Deleted)
   CC: HTN  HPI:Mr.Demaree Liberto is a 44 y.o. male who presents for evaluation of HTN. Please see individual problem based A/P for details.  Hypertension: Patient's BP today is ***/*** with a goal of <140/80. The patient endorses adherence to *** medication regimen. *** denied, chest pain, headache, visual changes, lightheadedness, weakness, dizziness on standing, swelling in the feet or ankles.   Plan: Continue *** ***mg *** Continue *** ***mg daily Continue *** ***mg BID  Abdominal Pain: ***  Plan: ***  ***: ***  Plan:  ***  ***: ***  Plan:  ***  Health Maintenance History: ***  Health Maintenance Significant procedure history: ***  Immunization History: ***  Depression, PHQ-9: Based on the patients    [facility] Visit from 10/13/2019 in [facility]  PHQ-9 Total Score  0     score we have ***.  Past Medical History:  Diagnosis Date  . Anxiety disorder   . At risk for obstructive sleep apnea 04/15/2018  . Bipolar 1 disorder (HCC)   . Depression   . Hepatitis B immune 04/15/2018  . Schizophrenia (HCC)    Review of Systems:  *** ROS negative except as per HPI.  Physical Exam: There were no vitals filed for this visit. There were no vitals filed for this visit. *** Assessment & Plan:   See Encounters Tab for problem based charting.  Patient {GC/GE:[id number]::"discussed with","seen with"} Dr. Debbora Dus. Hoffman","Klima","Mullen","Narendra","Raines","Vincent"}

## 2019-12-09 ENCOUNTER — Encounter: Payer: Medicaid Other | Admitting: Internal Medicine

## 2019-12-18 ENCOUNTER — Other Ambulatory Visit: Payer: Self-pay

## 2019-12-18 DIAGNOSIS — Z20822 Contact with and (suspected) exposure to covid-19: Secondary | ICD-10-CM

## 2019-12-19 LAB — SARS-COV-2, NAA 2 DAY TAT

## 2019-12-19 LAB — NOVEL CORONAVIRUS, NAA: SARS-CoV-2, NAA: NOT DETECTED

## 2019-12-21 ENCOUNTER — Encounter: Payer: Self-pay | Admitting: *Deleted

## 2020-01-04 ENCOUNTER — Other Ambulatory Visit: Payer: Self-pay | Admitting: Radiology

## 2020-01-04 DIAGNOSIS — Z20822 Contact with and (suspected) exposure to covid-19: Secondary | ICD-10-CM

## 2020-01-05 LAB — NOVEL CORONAVIRUS, NAA: SARS-CoV-2, NAA: NOT DETECTED

## 2020-01-05 LAB — SARS-COV-2, NAA 2 DAY TAT

## 2020-01-25 ENCOUNTER — Other Ambulatory Visit: Payer: Self-pay | Admitting: Internal Medicine

## 2020-01-25 DIAGNOSIS — I10 Essential (primary) hypertension: Secondary | ICD-10-CM

## 2020-01-25 DIAGNOSIS — E785 Hyperlipidemia, unspecified: Secondary | ICD-10-CM

## 2020-05-02 NOTE — Progress Notes (Deleted)
   CC: ***  HPI:  [name] is a 44 y.o. person with history of *** who presents to clinic for ***. Their last clinic visit was on ***.   To see the details of this patient's management of their acute and chronic problems, please refer to the Assessment & Plan under the Encounters tab.    Past Medical History:  Diagnosis Date  . Anxiety disorder   . At risk for obstructive sleep apnea 04/15/2018  . Bipolar 1 disorder (HCC)   . Depression   . Hepatitis B immune 04/15/2018  . Schizophrenia (HCC)    Review of Systems:    ROS  Physical Exam:  There were no vitals filed for this visit. Constitutional: HENT: Eyes: Neck:  Cardiovascular: Pulmonary/Chest: Abdominal: GU/Anorectal: MSK: Neurological: Skin: Psych:   Assessment & Plan:   See Encounters Tab for problem based charting.  Patient {GC/GE:[id number]::"discussed with","seen with"} Dr. {NAMES:[id number]::"Butcher","Guilloud","Hoffman","Mullen","Narendra","Raines","Vincent"}

## 2020-05-03 ENCOUNTER — Encounter: Payer: Medicaid Other | Admitting: Student

## 2020-06-14 ENCOUNTER — Encounter: Payer: Medicaid Other | Admitting: Internal Medicine

## 2020-08-25 ENCOUNTER — Telehealth: Payer: Self-pay

## 2020-08-25 ENCOUNTER — Encounter: Payer: Medicaid Other | Admitting: Internal Medicine

## 2020-08-25 NOTE — Telephone Encounter (Signed)
Received TC from patient who cancelled his appt w/ Dr. Chesley Mires for today because he states "I was sick".  He r/s'd this appt to in person on 08/29/20 w/ Dr. Chesley Mires. C/O n/v in the morning, diarrhea, h/a's.  Patient states he's had these ongoing symptoms for 1.34months, but they have been worse over the last week.  Pt states he was tested for Covid yesterday.    Appt on 08/29/20 @ 3:15 changed to Telehealth and RN informed patient if his Covid test result is negative, to call back and appt can be changed back to in-person.  Pt advised if he develops any SOB, chest pain, or is unable to hold fluids down, to go to ED or urgent care.  He verbalized understanding. SChaplin, RN,BSN

## 2020-08-29 ENCOUNTER — Other Ambulatory Visit: Payer: Self-pay

## 2020-08-29 ENCOUNTER — Ambulatory Visit: Payer: Medicaid Other | Admitting: Internal Medicine

## 2020-08-29 DIAGNOSIS — G8929 Other chronic pain: Secondary | ICD-10-CM

## 2020-08-29 DIAGNOSIS — R519 Headache, unspecified: Secondary | ICD-10-CM

## 2020-08-30 ENCOUNTER — Encounter: Payer: Self-pay | Admitting: Internal Medicine

## 2020-08-30 NOTE — Progress Notes (Signed)
  [facility] Telephone Encounter Continuity Care Appointment  HPI:   This telephone encounter was created for [name] on 08/30/2020 for the following purpose/cc discuss multiple issues.   Past Medical History:  Past Medical History:  Diagnosis Date  . Anxiety disorder   . At risk for obstructive sleep apnea 04/15/2018  . Bipolar 1 disorder (HCC)   . Depression   . Hepatitis B immune 04/15/2018  . Schizophrenia (HCC)       ROS:   Positive for headaches, nausea, fatigue   Assessment / Plan / Recommendations:   Please see A&P under problem oriented charting for assessment of the patient's acute and chronic medical conditions.   As always, pt is advised that if symptoms worsen or new symptoms arise, they should go to an urgent care facility or to to ER for further evaluation.   Consent and Medical Decision Making:   Patient discussed with Dr. Criselda Peaches  This is a telephone encounter between Bruce [name] and Bridget Hartshorn on 08/30/2020 for multiple medical issues. The visit was conducted with the patient located at home and Bridget Hartshorn at [facility]. The patient's identity was confirmed using their DOB and current address. The patient has consented to being evaluated through a telephone encounter and understands the associated risks (an examination cannot be done and the patient may need to come in for an appointment) / benefits (allows the patient to remain at home, decreasing exposure to coronavirus). I personally spent 15 minutes on medical discussion.

## 2020-08-30 NOTE — Assessment & Plan Note (Signed)
Patient calls in today with multiple complaints, including headaches, nausea, and fatigue. I explained that given his various symptoms it would be better for him to come in for an in-person visit. Will message the front desk to schedule a visit. He was moved to telehealth while awaiting COVID-19 results which were negative.

## 2020-09-06 NOTE — Progress Notes (Signed)
[facility] Attending  Case discussed with Dr. Bloomfield  At the time of the visit.  We reviewed the resident's history and pertinent patient test results.  I agree with the assessment, diagnosis, and plan of care documented in the resident's note.  

## 2020-11-12 ENCOUNTER — Encounter (HOSPITAL_COMMUNITY): Payer: Self-pay | Admitting: Emergency Medicine

## 2020-11-12 ENCOUNTER — Emergency Department (HOSPITAL_COMMUNITY): Payer: Medicaid Other

## 2020-11-12 ENCOUNTER — Emergency Department (HOSPITAL_COMMUNITY)
Admission: EM | Admit: 2020-11-12 | Discharge: 2020-11-12 | Discharge status: Against Medical Advice | Payer: Medicaid Other | Attending: Emergency Medicine | Admitting: Emergency Medicine

## 2020-11-12 DIAGNOSIS — F17210 Nicotine dependence, cigarettes, uncomplicated: Secondary | ICD-10-CM | POA: Insufficient documentation

## 2020-11-12 DIAGNOSIS — Z79899 Other long term (current) drug therapy: Secondary | ICD-10-CM | POA: Diagnosis not present

## 2020-11-12 DIAGNOSIS — R112 Nausea with vomiting, unspecified: Secondary | ICD-10-CM | POA: Diagnosis not present

## 2020-11-12 DIAGNOSIS — I10 Essential (primary) hypertension: Secondary | ICD-10-CM | POA: Insufficient documentation

## 2020-11-12 DIAGNOSIS — R1033 Periumbilical pain: Secondary | ICD-10-CM | POA: Insufficient documentation

## 2020-11-12 DIAGNOSIS — R1010 Upper abdominal pain, unspecified: Secondary | ICD-10-CM

## 2020-11-12 HISTORY — DX: Essential (primary) hypertension: I10

## 2020-11-12 LAB — CBC
HCT: 47.4 % (ref 39.0–52.0)
Hemoglobin: 15.7 g/dL (ref 13.0–17.0)
MCH: 27.3 pg (ref 26.0–34.0)
MCHC: 33.1 g/dL (ref 30.0–36.0)
MCV: 82.3 fL (ref 80.0–100.0)
Platelets: 286 10*3/uL (ref 150–400)
RBC: 5.76 MIL/uL (ref 4.22–5.81)
RDW: 15.1 % (ref 11.5–15.5)
WBC: 21.1 10*3/uL — ABNORMAL HIGH (ref 4.0–10.5)
nRBC: 0 % (ref 0.0–0.2)

## 2020-11-12 LAB — COMPREHENSIVE METABOLIC PANEL
ALT: 21 U/L (ref 0–44)
AST: 26 U/L (ref 15–41)
Albumin: 3.7 g/dL (ref 3.5–5.0)
Alkaline Phosphatase: 106 U/L (ref 38–126)
Anion gap: 9 (ref 5–15)
BUN: 9 mg/dL (ref 6–20)
CO2: 23 mmol/L (ref 22–32)
Calcium: 9 mg/dL (ref 8.9–10.3)
Chloride: 106 mmol/L (ref 98–111)
Creatinine, Ser: 1.38 mg/dL — ABNORMAL HIGH (ref 0.61–1.24)
GFR, Estimated: >60 mL/min (ref >60)
Glucose, Bld: 155 mg/dL — ABNORMAL HIGH (ref 70–99)
Potassium: 4.1 mmol/L (ref 3.5–5.1)
Sodium: 138 mmol/L (ref 135–145)
Total Bilirubin: 0.8 mg/dL (ref 0.3–1.2)
Total Protein: 6.9 g/dL (ref 6.5–8.1)

## 2020-11-12 LAB — TROPONIN I (HIGH SENSITIVITY): Troponin I (High Sensitivity): 4 ng/L (ref <18)

## 2020-11-12 LAB — LIPASE, BLOOD: Lipase: 27 U/L (ref 11–51)

## 2020-11-12 MED ORDER — HYDROMORPHONE HCL 1 MG/ML IJ SOLN
1.0000 mg | Freq: Once | INTRAMUSCULAR | Status: AC
  Administered 2020-11-12: 1 mg via INTRAVENOUS
  Filled 2020-11-12: qty 1

## 2020-11-12 MED ORDER — ONDANSETRON HCL 4 MG/2ML IJ SOLN
4.0000 mg | Freq: Once | INTRAMUSCULAR | Status: AC
  Administered 2020-11-12: 4 mg via INTRAVENOUS
  Filled 2020-11-12: qty 2

## 2020-11-12 MED ORDER — FAMOTIDINE IN NACL 20-0.9 MG/50ML-% IV SOLN
20.0000 mg | Freq: Once | INTRAVENOUS | Status: DC
  Filled 2020-11-12: qty 50

## 2020-11-12 MED ORDER — SODIUM CHLORIDE 0.9 % IV BOLUS
500.0000 mL | Freq: Once | INTRAVENOUS | Status: AC
  Administered 2020-11-12: 500 mL via INTRAVENOUS

## 2020-11-12 NOTE — ED Provider Notes (Incomplete)
[name] Provider Note   CSN: [id number] Arrival date & time: 11/12/20  1534     History Chief Complaint  Patient presents with  . Abdominal Pain    [name] is a 45 y.o. male.  HPI     Past Medical History:  Diagnosis Date  . Anxiety disorder   . At risk for obstructive sleep apnea 04/15/2018  . Bipolar 1 disorder (HCC)   . Depression   . Hepatitis B immune 04/15/2018  . Hypertension   . Schizophrenia [facility])     Patient Active Problem List   Diagnosis Date Noted  . Unexplained night sweats 10/13/2019  . Chronic headaches 10/13/2019  . Dizziness 10/13/2019  . Earlobe lesion, left 07/15/2019  . HLD (hyperlipidemia) 03/12/2019  . 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 Finger pain, left 01/20/2019  . HTN (hypertension) 11/12/2018  . Tobacco use disorder 05/20/2018  . Schizophrenia (HCC) 04/15/2018  . Bipolar disorder (HCC) 04/15/2018  . Anxiety disorder 04/15/2018  . Depression 04/15/2018  . Gastroesophageal reflux disease 04/15/2018    Past Surgical History:  Procedure Laterality Date  . wisdom teth removal         Family History  Problem Relation Age of Onset  . Diabetes Mother   . Hypertension Mother   . Prostate cancer Father   . Diabetes Sister   . Migraines Sister     Social History   Tobacco Use  . Smoking status: Current Every Day Smoker    Packs/day: 0.50    Types: Cigarettes  . Smokeless tobacco: Never Used  . Tobacco comment: previous marijuana smoker  Vaping Use  . Vaping Use: Former  Substance Use Topics  . Alcohol use: Not Currently  . Drug use: No    Home Medications Prior to Admission medications   Medication Sig Start Date End Date Taking? Authorizing Provider  acetaminophen (TYLENOL) 500 MG tablet Take 500 mg by mouth every 6 (six) hours as needed (pain).    [provider]  atorvastatin (LIPITOR) 40 MG tablet TAKE 1 TABLET  (40 MG TOTAL) BY MOUTH DAILY. 01/25/20   Inez Catalina, MD  hydrochlorothiazide (HYDRODIURIL) 12.5 MG tablet TAKE 1 TABLET (12.5 MG TOTAL) BY MOUTH DAILY. 07/27/19   Lanelle Bal, MD  lisinopril (ZESTRIL) 20 MG tablet TAKE 1 TABLET (20 MG TOTAL) BY MOUTH DAILY. 01/25/20 01/24/21  Inez Catalina, MD  nicotine polacrilex (NICORETTE) 4 MG gum Take 1 each (4 mg total) by mouth as needed for smoking cessation. 11/12/18   Lanelle Bal, MD  ondansetron (ZOFRAN ODT) 8 MG disintegrating tablet Take 1 tablet (8 mg total) by mouth every 8 (eight) hours as needed for nausea. 11/16/19   Derwood Kaplan, MD  sertraline (ZOLOFT) 50 MG tablet Take 1 tablet (50 mg total) by mouth 2 (two) times daily. 05/18/19   Lanelle Bal, MD    Allergies    Patient has no known allergies.  Review of Systems   Review of Systems  Physical Exam Updated Vital Signs BP (!) 123/97 (BP Location: Left Arm)   Pulse (!) 106   Temp 98.4 F (36.9 C) (Oral)   Resp (!) 25   SpO2 100%   Physical Exam  ED Results / Procedures / Treatments   Labs (all labs ordered are listed, but only abnormal results are displayed) Labs Reviewed  LIPASE, BLOOD  COMPREHENSIVE METABOLIC PANEL  CBC  URINALYSIS, ROUTINE W REFLEX MICROSCOPIC  TROPONIN I (  HIGH SENSITIVITY)    EKG EKG Interpretation  Date/Time:  Saturday November 12 2020 15:35:22 EDT Ventricular Rate:  84 PR Interval:  206 QRS Duration: 76 QT Interval:  384 QTC Calculation: 453 R Axis:   92 Text Interpretation: Normal sinus rhythm Rightward axis Early repolarization Borderline ECG Poor data quality Confirmed by Meridee Score [phone]) on 11/12/2020 3:54:09 PM   Radiology No results found.  Procedures Procedures {Remember to document critical care time when appropriate:1}  Medications Ordered in ED Medications - No data to display  ED Course  I have reviewed the triage vital signs and the nursing notes.  Pertinent labs & imaging results that were  available during my care of the patient were reviewed by me and considered in my medical decision making (see chart for details).    MDM Rules/Calculators/A&P                          *** Final Clinical Impression(s) / ED Diagnoses Final diagnoses:  None    Rx / DC Orders ED Discharge Orders    None

## 2020-11-12 NOTE — ED Notes (Signed)
MD Charm Barges notified that pt has left

## 2020-11-12 NOTE — ED Provider Notes (Signed)
[facility] EMERGENCY DEPARTMENT Provider Note   CSN: [id number] Arrival date & time: 11/12/20  1534     History Chief Complaint  Patient presents with  . Abdominal Pain    [name] is a 45 y.o. male.  He is complaining of upper abdominal pain associated with multiple episodes of vomiting that started today few hours ago.  Rates it severe in nature.  He said he has had this before but he does not know what the cause was.  He denies alcohol or drugs.  The history is provided by the patient.  Abdominal Pain Pain location:  Periumbilical Pain quality: stabbing   Pain severity:  Severe Onset quality:  Sudden Timing:  Constant Progression:  Unchanged Chronicity:  Recurrent Context: not trauma   Relieved by:  Nothing Worsened by:  Nothing Ineffective treatments:  None tried Associated symptoms: nausea and vomiting   Associated symptoms: no chest pain, no diarrhea, no dysuria, no fever, no hematemesis, no hematochezia, no hematuria, no shortness of breath and no sore throat   Risk factors: no alcohol abuse        Past Medical History:  Diagnosis Date  . Anxiety disorder   . At risk for obstructive sleep apnea 04/15/2018  . Bipolar 1 disorder (HCC)   . Depression   . Hepatitis B immune 04/15/2018  . Hypertension   . Schizophrenia [facility])     Patient Active Problem List   Diagnosis Date Noted  . Unexplained night sweats 10/13/2019  . Chronic headaches 10/13/2019  . Dizziness 10/13/2019  . Earlobe lesion, left 07/15/2019  . HLD (hyperlipidemia) 03/12/2019  . 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 Finger pain, left 01/20/2019  . HTN (hypertension) 11/12/2018  . Tobacco use disorder 05/20/2018  . Schizophrenia (HCC) 04/15/2018  . Bipolar disorder (HCC) 04/15/2018  . Anxiety disorder 04/15/2018  . Depression 04/15/2018  . Gastroesophageal reflux disease 04/15/2018    Past Surgical History:  Procedure  Laterality Date  . wisdom teth removal         Family History  Problem Relation Age of Onset  . Diabetes Mother   . Hypertension Mother   . Prostate cancer Father   . Diabetes Sister   . Migraines Sister     Social History   Tobacco Use  . Smoking status: Current Every Day Smoker    Packs/day: 0.50    Types: Cigarettes  . Smokeless tobacco: Never Used  . Tobacco comment: previous marijuana smoker  Vaping Use  . Vaping Use: Former  Substance Use Topics  . Alcohol use: Not Currently  . Drug use: No    Home Medications Prior to Admission medications   Medication Sig Start Date End Date Taking? Authorizing Provider  acetaminophen (TYLENOL) 500 MG tablet Take 500 mg by mouth every 6 (six) hours as needed (pain).    [provider]  atorvastatin (LIPITOR) 40 MG tablet TAKE 1 TABLET (40 MG TOTAL) BY MOUTH DAILY. 01/25/20   Inez Catalina, MD  hydrochlorothiazide (HYDRODIURIL) 12.5 MG tablet TAKE 1 TABLET (12.5 MG TOTAL) BY MOUTH DAILY. 07/27/19   Lanelle Bal, MD  lisinopril (ZESTRIL) 20 MG tablet TAKE 1 TABLET (20 MG TOTAL) BY MOUTH DAILY. 01/25/20 01/24/21  Inez Catalina, MD  nicotine polacrilex (NICORETTE) 4 MG gum Take 1 each (4 mg total) by mouth as needed for smoking cessation. 11/12/18   Lanelle Bal, MD  ondansetron (ZOFRAN ODT) 8 MG disintegrating tablet Take 1 tablet (  8 mg total) by mouth every 8 (eight) hours as needed for nausea. 11/16/19   Derwood Kaplan, MD  sertraline (ZOLOFT) 50 MG tablet Take 1 tablet (50 mg total) by mouth 2 (two) times daily. 05/18/19   Lanelle Bal, MD    Allergies    Patient has no known allergies.  Review of Systems   Review of Systems  Constitutional: Negative for fever.  HENT: Negative for sore throat.   Eyes: Negative for visual disturbance.  Respiratory: Negative for shortness of breath.   Cardiovascular: Negative for chest pain.  Gastrointestinal: Positive for abdominal pain, nausea and vomiting. Negative  for diarrhea, hematemesis and hematochezia.  Genitourinary: Negative for dysuria and hematuria.  Musculoskeletal: Negative for neck pain.  Skin: Negative for rash.  Neurological: Negative for headaches.    Physical Exam Updated Vital Signs BP (!) 123/97 (BP Location: Left Arm)   Pulse 80   Temp 98.4 F (36.9 C) (Oral)   Resp 12   SpO2 100%   Physical Exam Vitals and nursing note reviewed.  Constitutional:      General: He is in acute distress.     Appearance: Normal appearance. He is well-developed.  HENT:     Head: Normocephalic and atraumatic.  Eyes:     Conjunctiva/sclera: Conjunctivae normal.  Cardiovascular:     Rate and Rhythm: Normal rate and regular rhythm.     Heart sounds: No murmur heard.   Pulmonary:     Effort: Pulmonary effort is normal. No respiratory distress.     Breath sounds: Normal breath sounds.  Abdominal:     Palpations: Abdomen is soft.     Tenderness: There is generalized abdominal tenderness. There is no guarding or rebound.  Musculoskeletal:        General: No deformity or signs of injury. Normal range of motion.     Cervical back: Neck supple.  Skin:    General: Skin is warm and dry.  Neurological:     General: No focal deficit present.     Mental Status: He is alert.     ED Results / Procedures / Treatments   Labs (all labs ordered are listed, but only abnormal results are displayed) Labs Reviewed  COMPREHENSIVE METABOLIC PANEL - Abnormal; Notable for the following components:      Result Value   Glucose, Bld 155 (*)    Creatinine, Ser 1.38 (*)    All other components within normal limits  CBC - Abnormal; Notable for the following components:   WBC 21.1 (*)    All other components within normal limits  LIPASE, BLOOD  TROPONIN I (HIGH SENSITIVITY)  TROPONIN I (HIGH SENSITIVITY)    EKG EKG Interpretation  Date/Time:  Saturday November 12 2020 15:35:22 EDT Ventricular Rate:  84 PR Interval:  206 QRS Duration: 76 QT  Interval:  384 QTC Calculation: 453 R Axis:   92 Text Interpretation: Normal sinus rhythm Rightward axis Early repolarization Borderline ECG Poor data quality Confirmed by Meridee Score [phone]) on 11/12/2020 3:54:09 PM   Radiology DG Chest Port 1 View  Result Date: 11/12/2020 CLINICAL DATA:  Upper abdominal pain for 3 hours. Nausea and vomiting. EXAM: PORTABLE CHEST 1 VIEW COMPARISON:  None. FINDINGS: The cardiomediastinal contours are normal. No evidence of pneumomediastinum. The lungs are clear. Pulmonary vasculature is normal. No consolidation, pleural effusion, or pneumothorax. No acute osseous abnormalities are seen. No visualized free air under the hemidiaphragms. IMPRESSION: Negative portable AP view of the chest. Electronically Signed   By: Shawna Orleans  Sanford M.D.   On: 11/12/2020 16:37    Procedures Procedures   Medications Ordered in ED Medications  HYDROmorphone (DILAUDID) injection 1 mg (has no administration in time range)  ondansetron (ZOFRAN) injection 4 mg (has no administration in time range)  sodium chloride 0.9 % bolus 500 mL (has no administration in time range)    ED Course  I have reviewed the triage vital signs and the nursing notes.  Pertinent labs & imaging results that were available during my care of the patient were reviewed by me and considered in my medical decision making (see chart for details).  Clinical Course as of 11/13/20 1009  Sat Nov 12, 2020  1639 Chest x-ray showing no acute disease. [MB]  1657 Reassessed patient after medications.  He is resting comfortably now.  Reviewed prior records.  He has a couple of ED visits for abdominal pain with negative work-ups. [MB]    Clinical Course User Index [MB] [name], MD   MDM Rules/Calculators/A&P                         This patient complains of abdominal pain; this involves an extensive number of treatment Options and is a complaint that carries with it a high risk of complications  and Morbidity. The differential includes gastritis, gastroparesis cannabis hyperemesis syndrome, biliary colic, ACS  I ordered, reviewed and interpreted labs, which included CBC with markedly elevated white count, chemistries with elevated glucose elevated creatinine similar to priors, troponin remarkable, lipase normal and LFTs normal I ordered medication IV fluids, IV pain medication, IV nausea medication I ordered imaging studies which included chest x-ray and I independently    visualized and interpreted imaging which showed no acute findings  Previous records obtained and reviewed in epic, 2 prior visits for similar presentations with no etiology identified  After the interventions stated above, I reevaluated the patient and found that he had eloped.  He is pending CT abdomen and pelvis with contrast.  Nurse informed me that he pulled out his IV and exited without informing anybody.   Final Clinical Impression(s) / ED Diagnoses Final diagnoses:  Pain of upper abdomen    Rx / DC Orders ED Discharge Orders    None       [name], MD 11/13/20 1012

## 2020-11-12 NOTE — ED Notes (Signed)
Attempted to call patients cell phone with no answer. Pt has left no belongings in the room.

## 2020-11-12 NOTE — ED Triage Notes (Signed)
Pt writhing in pain.  Reports 10/10 upper abd pain and epigastric pain x 3 hours.  Reports nausea and vomiting approx 25 times.  Pt diaphoretic.

## 2020-11-12 NOTE — ED Notes (Signed)
Pt seen ripping IV out of arm and getting dressed in room. Pt asked to go to bathroom, this RN showed pt to bathroom. Pt left bathroom and left the ED from the back hall without notifying any RNS or EDP.

## 2020-11-14 ENCOUNTER — Other Ambulatory Visit: Payer: Self-pay | Admitting: Internal Medicine

## 2020-11-14 ENCOUNTER — Other Ambulatory Visit: Payer: Self-pay

## 2020-11-14 ENCOUNTER — Emergency Department (HOSPITAL_COMMUNITY)
Admission: EM | Admit: 2020-11-14 | Discharge: 2020-11-14 | Disposition: A | Discharge status: Home / Self Care | Payer: Medicaid Other | Attending: Emergency Medicine | Admitting: Emergency Medicine

## 2020-11-14 ENCOUNTER — Emergency Department (HOSPITAL_COMMUNITY): Payer: Medicaid Other

## 2020-11-14 ENCOUNTER — Encounter (HOSPITAL_COMMUNITY): Payer: Self-pay | Admitting: Radiology

## 2020-11-14 DIAGNOSIS — Z79899 Other long term (current) drug therapy: Secondary | ICD-10-CM | POA: Diagnosis not present

## 2020-11-14 DIAGNOSIS — R112 Nausea with vomiting, unspecified: Secondary | ICD-10-CM

## 2020-11-14 DIAGNOSIS — F17210 Nicotine dependence, cigarettes, uncomplicated: Secondary | ICD-10-CM | POA: Insufficient documentation

## 2020-11-14 DIAGNOSIS — R1013 Epigastric pain: Secondary | ICD-10-CM | POA: Diagnosis present

## 2020-11-14 DIAGNOSIS — I10 Essential (primary) hypertension: Secondary | ICD-10-CM | POA: Insufficient documentation

## 2020-11-14 DIAGNOSIS — E785 Hyperlipidemia, unspecified: Secondary | ICD-10-CM

## 2020-11-14 LAB — LIPASE, BLOOD: Lipase: 49 U/L (ref 11–51)

## 2020-11-14 LAB — CBC WITH DIFFERENTIAL/PLATELET
Abs Immature Granulocytes: 0.1 10*3/uL — ABNORMAL HIGH (ref 0.00–0.07)
Basophils Absolute: 0.1 10*3/uL (ref 0.0–0.1)
Basophils Relative: 1 %
Eosinophils Absolute: 0.2 10*3/uL (ref 0.0–0.5)
Eosinophils Relative: 1 %
HCT: 45.9 % (ref 39.0–52.0)
Hemoglobin: 15.1 g/dL (ref 13.0–17.0)
Immature Granulocytes: 1 %
Lymphocytes Relative: 25 %
Lymphs Abs: 4.5 10*3/uL — ABNORMAL HIGH (ref 0.7–4.0)
MCH: 27.6 pg (ref 26.0–34.0)
MCHC: 32.9 g/dL (ref 30.0–36.0)
MCV: 83.9 fL (ref 80.0–100.0)
Monocytes Absolute: 1.3 10*3/uL — ABNORMAL HIGH (ref 0.1–1.0)
Monocytes Relative: 7 %
Neutro Abs: 11.5 10*3/uL — ABNORMAL HIGH (ref 1.7–7.7)
Neutrophils Relative %: 65 %
Platelets: 280 10*3/uL (ref 150–400)
RBC: 5.47 MIL/uL (ref 4.22–5.81)
RDW: 15 % (ref 11.5–15.5)
WBC: 17.6 10*3/uL — ABNORMAL HIGH (ref 4.0–10.5)
nRBC: 0 % (ref 0.0–0.2)

## 2020-11-14 LAB — URINALYSIS, ROUTINE W REFLEX MICROSCOPIC
Bilirubin Urine: NEGATIVE
Glucose, UA: NEGATIVE mg/dL
Hgb urine dipstick: NEGATIVE
Ketones, ur: NEGATIVE mg/dL
Leukocytes,Ua: NEGATIVE
Nitrite: NEGATIVE
Protein, ur: NEGATIVE mg/dL
Specific Gravity, Urine: 1.014 (ref 1.005–1.030)
pH: 7 (ref 5.0–8.0)

## 2020-11-14 LAB — COMPREHENSIVE METABOLIC PANEL
ALT: 21 U/L (ref 0–44)
AST: 28 U/L (ref 15–41)
Albumin: 3.3 g/dL — ABNORMAL LOW (ref 3.5–5.0)
Alkaline Phosphatase: 108 U/L (ref 38–126)
Anion gap: 7 (ref 5–15)
BUN: 8 mg/dL (ref 6–20)
CO2: 24 mmol/L (ref 22–32)
Calcium: 8.6 mg/dL — ABNORMAL LOW (ref 8.9–10.3)
Chloride: 107 mmol/L (ref 98–111)
Creatinine, Ser: 1.34 mg/dL — ABNORMAL HIGH (ref 0.61–1.24)
GFR, Estimated: >60 mL/min (ref >60)
Glucose, Bld: 130 mg/dL — ABNORMAL HIGH (ref 70–99)
Potassium: 4.2 mmol/L (ref 3.5–5.1)
Sodium: 138 mmol/L (ref 135–145)
Total Bilirubin: 0.5 mg/dL (ref 0.3–1.2)
Total Protein: 6.1 g/dL — ABNORMAL LOW (ref 6.5–8.1)

## 2020-11-14 MED ORDER — ONDANSETRON 4 MG PO TBDP: ORAL_TABLET | ORAL | 0 refills | Status: DC

## 2020-11-14 MED ORDER — IOHEXOL 300 MG/ML  SOLN
100.0000 mL | Freq: Once | INTRAMUSCULAR | Status: AC | PRN
  Administered 2020-11-14: 100 mL via INTRAVENOUS

## 2020-11-14 MED ORDER — SODIUM CHLORIDE 0.9 % IV BOLUS
1000.0000 mL | Freq: Once | INTRAVENOUS | Status: AC
  Administered 2020-11-14: 1000 mL via INTRAVENOUS

## 2020-11-14 MED ORDER — MORPHINE SULFATE (PF) 4 MG/ML IV SOLN
4.0000 mg | Freq: Once | INTRAVENOUS | Status: AC
Start: 2020-11-14 — End: 2020-11-14
  Administered 2020-11-14: 4 mg via INTRAVENOUS
  Filled 2020-11-14: qty 1

## 2020-11-14 MED ORDER — DROPERIDOL 2.5 MG/ML IJ SOLN
1.2500 mg | Freq: Once | INTRAMUSCULAR | Status: AC
  Administered 2020-11-14: 1.25 mg via INTRAVENOUS
  Filled 2020-11-14 (×2): qty 2

## 2020-11-14 MED ORDER — FAMOTIDINE IN NACL 20-0.9 MG/50ML-% IV SOLN
20.0000 mg | Freq: Once | INTRAVENOUS | Status: AC
  Administered 2020-11-14: 20 mg via INTRAVENOUS
  Filled 2020-11-14: qty 50

## 2020-11-14 MED ORDER — DIPHENHYDRAMINE HCL 50 MG/ML IJ SOLN
25.0000 mg | Freq: Once | INTRAMUSCULAR | Status: AC
  Administered 2020-11-14: 25 mg via INTRAVENOUS
  Filled 2020-11-14: qty 1

## 2020-11-14 MED ORDER — MORPHINE SULFATE (PF) 4 MG/ML IV SOLN
4.0000 mg | Freq: Once | INTRAVENOUS | Status: AC
  Administered 2020-11-14: 4 mg via INTRAVENOUS
  Filled 2020-11-14: qty 1

## 2020-11-14 MED ORDER — DROPERIDOL 2.5 MG/ML IJ SOLN
1.2500 mg | Freq: Once | INTRAMUSCULAR | Status: AC
  Administered 2020-11-14: 1.25 mg via INTRAVENOUS
  Filled 2020-11-14: qty 2

## 2020-11-14 MED ORDER — PROMETHAZINE HCL 25 MG RE SUPP: 25.0000 mg | Freq: Four times a day (QID) | RECTAL | 0 refills | Status: DC | PRN

## 2020-11-14 NOTE — ED Notes (Signed)
Pt. HR 78 NSR

## 2020-11-14 NOTE — ED Notes (Signed)
Patient transported to CT 

## 2020-11-14 NOTE — ED Provider Notes (Signed)
[facility] EMERGENCY DEPARTMENT Provider Note   CSN: [id number] Arrival date & time: 11/14/20  7673     History Chief Complaint  Patient presents with  . Abdominal Pain    Bruce [name] is a 45 y.o. male.  45 yo M with a chief complaint of epigastric abdominal pain.  Going on for the past day or so.  Patient has a history of similar pain that seems to come and go.  Described as severe.  Denies any fevers.  Having some vomiting.  Feels similar to prior events.  Was seen a couple days ago but had to leave.  The history is provided by the patient.  Abdominal Pain Pain location:  Epigastric Pain quality: sharp and shooting   Pain radiates to:  Does not radiate Pain severity:  Severe Onset quality:  Gradual Duration:  2 days Timing:  Constant Progression:  Worsening Chronicity:  Chronic Relieved by:  Nothing Worsened by:  Nothing Ineffective treatments:  None tried Associated symptoms: nausea and vomiting   Associated symptoms: no chest pain, no chills, no diarrhea, no fever and no shortness of breath        Past Medical History:  Diagnosis Date  . Anxiety disorder   . At risk for obstructive sleep apnea 04/15/2018  . Bipolar 1 disorder (HCC)   . Depression   . Hepatitis B immune 04/15/2018  . Hypertension   . Schizophrenia [facility])     Patient Active Problem List   Diagnosis Date Noted  . Unexplained night sweats 10/13/2019  . Chronic headaches 10/13/2019  . Dizziness 10/13/2019  . Earlobe lesion, left 07/15/2019  . HLD (hyperlipidemia) 03/12/2019  . 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 Finger pain, left 01/20/2019  . HTN (hypertension) 11/12/2018  . Tobacco use disorder 05/20/2018  . Schizophrenia (HCC) 04/15/2018  . Bipolar disorder (HCC) 04/15/2018  . Anxiety disorder 04/15/2018  . Depression 04/15/2018  . Gastroesophageal reflux disease 04/15/2018    Past Surgical History:  Procedure  Laterality Date  . wisdom teth removal         Family History  Problem Relation Age of Onset  . Diabetes Mother   . Hypertension Mother   . Prostate cancer Father   . Diabetes Sister   . Migraines Sister     Social History   Tobacco Use  . Smoking status: Current Every Day Smoker    Packs/day: 0.50    Types: Cigarettes  . Smokeless tobacco: Never Used  . Tobacco comment: previous marijuana smoker  Vaping Use  . Vaping Use: Former  Substance Use Topics  . Alcohol use: Not Currently  . Drug use: No    Home Medications Prior to Admission medications   Medication Sig Start Date End Date Taking? Authorizing Provider  ondansetron (ZOFRAN ODT) 4 MG disintegrating tablet 4mg  ODT q4 hours prn nausea/vomit 11/14/20  Yes 11/16/20, DO  promethazine (PHENERGAN) 25 MG suppository Place 1 suppository (25 mg total) rectally every 6 (six) hours as needed for nausea or vomiting. 11/14/20  Yes 11/16/20, DO  acetaminophen (TYLENOL) 500 MG tablet Take 500 mg by mouth every 6 (six) hours as needed (pain).    [provider]  atorvastatin (LIPITOR) 40 MG tablet TAKE 1 TABLET (40 MG TOTAL) BY MOUTH DAILY. 01/25/20   03/26/20, MD  hydrochlorothiazide (HYDRODIURIL) 12.5 MG tablet TAKE 1 TABLET (12.5 MG TOTAL) BY MOUTH DAILY. 07/27/19   14/7/20, MD  lisinopril (ZESTRIL)  20 MG tablet TAKE 1 TABLET (20 MG TOTAL) BY MOUTH DAILY. 01/25/20 01/24/21  Inez Catalina, MD  nicotine polacrilex (NICORETTE) 4 MG gum Take 1 each (4 mg total) by mouth as needed for smoking cessation. 11/12/18   Lanelle Bal, MD  sertraline (ZOLOFT) 50 MG tablet Take 1 tablet (50 mg total) by mouth 2 (two) times daily. 05/18/19   Lanelle Bal, MD    Allergies    Patient has no known allergies.  Review of Systems   Review of Systems  Constitutional: Negative for chills and fever.  HENT: Negative for congestion and facial swelling.   Eyes: Negative for discharge and visual disturbance.   Respiratory: Negative for shortness of breath.   Cardiovascular: Negative for chest pain and palpitations.  Gastrointestinal: Positive for abdominal pain, nausea and vomiting. Negative for diarrhea.  Musculoskeletal: Negative for arthralgias and myalgias.  Skin: Negative for color change and rash.  Neurological: Negative for tremors, syncope and headaches.  Psychiatric/Behavioral: Negative for confusion and dysphoric mood.    Physical Exam Updated Vital Signs BP 124/75   Pulse 67   Temp (!) 97 F (36.1 C) (Axillary)   Resp 16   SpO2 99%   Physical Exam Vitals and nursing note reviewed.  Constitutional:      Appearance: He is well-developed.  HENT:     Head: Normocephalic and atraumatic.  Eyes:     Pupils: Pupils are equal, round, and reactive to light.  Neck:     Vascular: No JVD.  Cardiovascular:     Rate and Rhythm: Normal rate and regular rhythm.     Heart sounds: No murmur heard. No friction rub. No gallop.   Pulmonary:     Effort: No respiratory distress.     Breath sounds: No wheezing.  Abdominal:     General: There is no distension.     Tenderness: There is abdominal tenderness (mild epigastric). There is no guarding or rebound.  Musculoskeletal:        General: Normal range of motion.     Cervical back: Normal range of motion and neck supple.  Skin:    Coloration: Skin is not pale.     Findings: No rash.  Neurological:     Mental Status: He is alert and oriented to person, place, and time.  Psychiatric:        Behavior: Behavior normal.     ED Results / Procedures / Treatments   Labs (all labs ordered are listed, but only abnormal results are displayed) Labs Reviewed  COMPREHENSIVE METABOLIC PANEL - Abnormal; Notable for the following components:      Result Value   Glucose, Bld 130 (*)    Creatinine, Ser 1.34 (*)    Calcium 8.6 (*)    Total Protein 6.1 (*)    Albumin 3.3 (*)    All other components within normal limits  CBC WITH  DIFFERENTIAL/PLATELET - Abnormal; Notable for the following components:   WBC 17.6 (*)    Neutro Abs 11.5 (*)    Lymphs Abs 4.5 (*)    Monocytes Absolute 1.3 (*)    Abs Immature Granulocytes 0.10 (*)    All other components within normal limits  URINALYSIS, ROUTINE W REFLEX MICROSCOPIC - Abnormal; Notable for the following components:   APPearance HAZY (*)    All other components within normal limits  LIPASE, BLOOD    EKG None  Radiology CT ABDOMEN PELVIS W CONTRAST  Result Date: 11/14/2020 CLINICAL DATA:  Abdominal pain. Diffuse  abdominal pain since this morning EXAM: CT ABDOMEN AND PELVIS WITH CONTRAST TECHNIQUE: Multidetector CT imaging of the abdomen and pelvis was performed using the standard protocol following bolus administration of intravenous contrast. CONTRAST:  100mL OMNIPAQUE IOHEXOL 300 MG/ML  SOLN COMPARISON:  None. FINDINGS: Lower chest: Lung bases are clear. Hepatobiliary: No focal hepatic lesion. No biliary duct dilatation. Common bile duct is normal. Pancreas: Pancreas is normal. No ductal dilatation. No pancreatic inflammation. Spleen: Normal spleen Adrenals/urinary tract: Adrenal glands and kidneys are normal. The ureters and bladder normal. Stomach/Bowel: Stomach, small bowel, appendix, and cecum are normal. The colon and rectosigmoid colon are normal. Vascular/Lymphatic: Abdominal aorta is normal caliber. No periportal or retroperitoneal adenopathy. No pelvic adenopathy. Reproductive: Prostate unremarkable Other: No free fluid or abscess. Musculoskeletal: Degenerative spurring at L5-S1.  No acute findings IMPRESSION: 1. No acute findings in the abdomen pelvis. 2. Normal appendix. 3. Normal gallbladder. 4. No obstructive uropathy Electronically Signed   By: Genevive BiStewart  Edmunds M.D.   On: 11/14/2020 13:31   DG Chest Port 1 View  Result Date: 11/12/2020 CLINICAL DATA:  Upper abdominal pain for 3 hours. Nausea and vomiting. EXAM: PORTABLE CHEST 1 VIEW COMPARISON:  None.  FINDINGS: The cardiomediastinal contours are normal. No evidence of pneumomediastinum. The lungs are clear. Pulmonary vasculature is normal. No consolidation, pleural effusion, or pneumothorax. No acute osseous abnormalities are seen. No visualized free air under the hemidiaphragms. IMPRESSION: Negative portable AP view of the chest. Electronically Signed   By: Narda RutherfordMelanie  Sanford M.D.   On: 11/12/2020 16:37    Procedures Procedures   Medications Ordered in ED Medications  sodium chloride 0.9 % bolus 1,000 mL (0 mLs Intravenous Stopped 11/14/20 1249)  droperidol (INAPSINE) 2.5 MG/ML injection 1.25 mg (1.25 mg Intravenous Given 11/14/20 1106)  diphenhydrAMINE (BENADRYL) injection 25 mg (25 mg Intravenous Given 11/14/20 1058)  morphine 4 MG/ML injection 4 mg (4 mg Intravenous Given 11/14/20 1052)  famotidine (PEPCID) IVPB 20 mg premix (0 mg Intravenous Stopped 11/14/20 1150)  morphine 4 MG/ML injection 4 mg (4 mg Intravenous Given 11/14/20 1337)  droperidol (INAPSINE) 2.5 MG/ML injection 1.25 mg (1.25 mg Intravenous Given 11/14/20 1339)  diphenhydrAMINE (BENADRYL) injection 25 mg (25 mg Intravenous Given 11/14/20 1339)  iohexol (OMNIPAQUE) 300 MG/ML solution 100 mL (100 mLs Intravenous Contrast Given 11/14/20 1305)    ED Course  I have reviewed the triage vital signs and the nursing notes.  Pertinent labs & imaging results that were available during my care of the patient were reviewed by me and considered in my medical decision making (see chart for details).    MDM Rules/Calculators/A&P                          45 yo M with a chief complaint of epigastric abdominal pain.  Patient with a history of the same has 3 prior ED visits that sound very similar.  Patient presenting somewhat typically of a cannabinoid hyperemesis syndrome.  Unfortunately the patient was seen less than 48 hours ago and there was some concern for intra-abdominal pathology and a CT scan was ordered.  We will treat his pain and  nausea here.  Reassess.  CT scan is negative.  Patient feeling better on reassessment.  Abdomen soft nontender on repeat exam.  Tolerating p.o.  [facility]  I question whether this is cannabinoid hyperemesis syndrome cyclic vomiting or persistent reflux.  We will have him follow-up with his family  doctor.  2:49 PM:  I have discussed the diagnosis/risks/treatment options with the patient and believe the pt to be eligible for discharge home to follow-up with PCP. We also discussed returning to the ED immediately if new or worsening sx occur. We discussed the sx which are most concerning (e.g., sudden worsening pain, fever, inability to tolerate by mouth) that necessitate immediate return. Medications administered to the patient during their visit and any new prescriptions provided to the patient are listed below.  Medications given during this visit Medications  sodium chloride 0.9 % bolus 1,000 mL (0 mLs Intravenous Stopped 11/14/20 1249)  droperidol (INAPSINE) 2.5 MG/ML injection 1.25 mg (1.25 mg Intravenous Given 11/14/20 1106)  diphenhydrAMINE (BENADRYL) injection 25 mg (25 mg Intravenous Given 11/14/20 1058)  morphine 4 MG/ML injection 4 mg (4 mg Intravenous Given 11/14/20 1052)  famotidine (PEPCID) IVPB 20 mg premix (0 mg Intravenous Stopped 11/14/20 1150)  morphine 4 MG/ML injection 4 mg (4 mg Intravenous Given 11/14/20 1337)  droperidol (INAPSINE) 2.5 MG/ML injection 1.25 mg (1.25 mg Intravenous Given 11/14/20 1339)  diphenhydrAMINE (BENADRYL) injection 25 mg (25 mg Intravenous Given 11/14/20 1339)  iohexol (OMNIPAQUE) 300 MG/ML solution 100 mL (100 mLs Intravenous Contrast Given 11/14/20 1305)     The patient appears reasonably screen and/or stabilized for discharge and I doubt any other medical condition or other [facility] requiring further screening, evaluation, or treatment in the ED at this time prior to discharge.   Final Clinical Impression(s) / ED Diagnoses Final  diagnoses:  Nausea and vomiting in adult    Rx / DC Orders ED Discharge Orders         Ordered    ondansetron (ZOFRAN ODT) 4 MG disintegrating tablet        11/14/20 1447    promethazine (PHENERGAN) 25 MG suppository  Every 6 hours PRN        11/14/20 1447           Melene Plan, DO 11/14/20 1449

## 2020-11-14 NOTE — ED Triage Notes (Signed)
C/o severe abdominal pain, was here yesterday but could not wait,

## 2020-11-14 NOTE — Discharge Instructions (Signed)
Try pepcid or tagamet up to twice a day.  Try to avoid things that may make this worse, most commonly these are spicy foods tomato based products fatty foods chocolate and peppermint.  Alcohol and tobacco can also make this worse.  Return to the emergency department for sudden worsening pain fever or inability to eat or drink.  Your symptoms could be due to marijuana use.  The only treatment if that is true is the stop smoking marijuana.  I prescribed you 2 different nausea medicines.  The first can dissolve under your tongue the other one you stick into your bottom if you have persistent vomiting.  This may be could save you a trip back to the emergency department.

## 2020-11-21 ENCOUNTER — Other Ambulatory Visit: Payer: Self-pay

## 2020-11-21 ENCOUNTER — Encounter: Payer: Medicaid Other | Admitting: Internal Medicine

## 2020-11-21 ENCOUNTER — Emergency Department (HOSPITAL_COMMUNITY)
Admission: EM | Admit: 2020-11-21 | Discharge: 2020-11-21 | Disposition: A | Discharge status: Home / Self Care | Payer: Medicaid Other | Attending: Emergency Medicine | Admitting: Emergency Medicine

## 2020-11-21 DIAGNOSIS — Z79899 Other long term (current) drug therapy: Secondary | ICD-10-CM | POA: Diagnosis not present

## 2020-11-21 DIAGNOSIS — I10 Essential (primary) hypertension: Secondary | ICD-10-CM | POA: Diagnosis not present

## 2020-11-21 DIAGNOSIS — K219 Gastro-esophageal reflux disease without esophagitis: Secondary | ICD-10-CM | POA: Diagnosis not present

## 2020-11-21 DIAGNOSIS — F17210 Nicotine dependence, cigarettes, uncomplicated: Secondary | ICD-10-CM | POA: Insufficient documentation

## 2020-11-21 DIAGNOSIS — R112 Nausea with vomiting, unspecified: Secondary | ICD-10-CM | POA: Diagnosis not present

## 2020-11-21 DIAGNOSIS — R1010 Upper abdominal pain, unspecified: Secondary | ICD-10-CM | POA: Insufficient documentation

## 2020-11-21 LAB — URINALYSIS, ROUTINE W REFLEX MICROSCOPIC
Bacteria, UA: NONE SEEN
Bilirubin Urine: NEGATIVE
Glucose, UA: NEGATIVE mg/dL
Hgb urine dipstick: NEGATIVE
Ketones, ur: NEGATIVE mg/dL
Nitrite: NEGATIVE
Protein, ur: NEGATIVE mg/dL
Specific Gravity, Urine: 1.006 (ref 1.005–1.030)
pH: 7 (ref 5.0–8.0)

## 2020-11-21 LAB — COMPREHENSIVE METABOLIC PANEL
ALT: 20 U/L (ref 0–44)
AST: 24 U/L (ref 15–41)
Albumin: 3.5 g/dL (ref 3.5–5.0)
Alkaline Phosphatase: 104 U/L (ref 38–126)
Anion gap: 8 (ref 5–15)
BUN: 11 mg/dL (ref 6–20)
CO2: 25 mmol/L (ref 22–32)
Calcium: 8.6 mg/dL — ABNORMAL LOW (ref 8.9–10.3)
Chloride: 107 mmol/L (ref 98–111)
Creatinine, Ser: 1.26 mg/dL — ABNORMAL HIGH (ref 0.61–1.24)
GFR, Estimated: >60 mL/min (ref >60)
Glucose, Bld: 107 mg/dL — ABNORMAL HIGH (ref 70–99)
Potassium: 4.5 mmol/L (ref 3.5–5.1)
Sodium: 140 mmol/L (ref 135–145)
Total Bilirubin: 0.6 mg/dL (ref 0.3–1.2)
Total Protein: 6.6 g/dL (ref 6.5–8.1)

## 2020-11-21 LAB — LIPASE, BLOOD: Lipase: 52 U/L — ABNORMAL HIGH (ref 11–51)

## 2020-11-21 LAB — CBC
HCT: 44.2 % (ref 39.0–52.0)
Hemoglobin: 14.4 g/dL (ref 13.0–17.0)
MCH: 27.5 pg (ref 26.0–34.0)
MCHC: 32.6 g/dL (ref 30.0–36.0)
MCV: 84.4 fL (ref 80.0–100.0)
Platelets: 298 10*3/uL (ref 150–400)
RBC: 5.24 MIL/uL (ref 4.22–5.81)
RDW: 15.2 % (ref 11.5–15.5)
WBC: 17.3 10*3/uL — ABNORMAL HIGH (ref 4.0–10.5)
nRBC: 0 % (ref 0.0–0.2)

## 2020-11-21 MED ORDER — PANTOPRAZOLE SODIUM 20 MG PO TBEC: 20.0000 mg | DELAYED_RELEASE_TABLET | Freq: Every day | ORAL | 0 refills | Status: DC

## 2020-11-21 MED ORDER — ONDANSETRON HCL 4 MG/2ML IJ SOLN
4.0000 mg | Freq: Once | INTRAMUSCULAR | Status: AC
  Administered 2020-11-21: 4 mg via INTRAVENOUS
  Filled 2020-11-21: qty 2

## 2020-11-21 MED ORDER — SODIUM CHLORIDE 0.9 % IV BOLUS
1000.0000 mL | Freq: Once | INTRAVENOUS | Status: AC
  Administered 2020-11-21: 1000 mL via INTRAVENOUS

## 2020-11-21 MED ORDER — ALUM & MAG HYDROXIDE-SIMETH 200-200-20 MG/5ML PO SUSP
30.0000 mL | Freq: Once | ORAL | Status: AC
  Administered 2020-11-21: 30 mL via ORAL
  Filled 2020-11-21: qty 30

## 2020-11-21 MED ORDER — PANTOPRAZOLE SODIUM 40 MG IV SOLR
40.0000 mg | Freq: Once | INTRAVENOUS | Status: AC
  Administered 2020-11-21: 40 mg via INTRAVENOUS
  Filled 2020-11-21: qty 40

## 2020-11-21 MED ORDER — LIDOCAINE VISCOUS HCL 2 % MT SOLN
15.0000 mL | Freq: Once | OROMUCOSAL | Status: AC
  Administered 2020-11-21: 15 mL via ORAL
  Filled 2020-11-21: qty 15

## 2020-11-21 MED ORDER — ALUM & MAG HYDROXIDE-SIMETH 400-400-40 MG/5ML PO SUSP: 10.0000 mL | Freq: Four times a day (QID) | ORAL | 0 refills | Status: DC | PRN

## 2020-11-21 NOTE — ED Provider Notes (Signed)
[facility] EMERGENCY DEPARTMENT Provider Note   CSN: [id number] Arrival date & time: 11/21/20  1153     History Chief Complaint  Patient presents with  . Abdominal Pain    Allison Silva is a 45 y.o. male presented for evaluation of abdominal pain, nausea, vomiting.  Patient states the past 4 months to years he has had intermittent abdominal symptoms.  He reports he has episodes that start with stabbing/ripping pain in his stomach that make him vomit, and then his symptoms are hard to control.  This has been happening more frequently.  The current episode began this morning.  He reports upper abdominal pain, nausea, vomiting.  The symptoms mostly resolved when he was in the waiting room prior to provider evaluation.  He has not taken anything for it.  He denies fevers, chills, chest pain, shortness of breath, cough, urinary symptoms, normal bowel movements.  He denies alcohol use.  Patient states when he gets upset or people are arguing around him, this can sometimes trigger his symptoms.  He also reports daily marijuana use.  Additional history obtained from chart review.  Patient with a history of schizophrenia, hypertension, depression, anxiety, bipolar.  I reviewed his recent ER visits over the past few weeks.  He has overall negative work-ups including a negative CT scan.   HPI     Past Medical History:  Diagnosis Date  . Anxiety disorder   . At risk for obstructive sleep apnea 04/15/2018  . Bipolar 1 disorder (HCC)   . Depression   . Hepatitis B immune 04/15/2018  . Hypertension   . Schizophrenia [facility])     Patient Active Problem List   Diagnosis Date Noted  . Unexplained night sweats 10/13/2019  . Chronic headaches 10/13/2019  . Dizziness 10/13/2019  . Earlobe lesion, left 07/15/2019  . HLD (hyperlipidemia) 03/12/2019  . 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 Finger pain, left 01/20/2019  . HTN (hypertension)  11/12/2018  . Tobacco use disorder 05/20/2018  . Schizophrenia (HCC) 04/15/2018  . Bipolar disorder (HCC) 04/15/2018  . Anxiety disorder 04/15/2018  . Depression 04/15/2018  . Gastroesophageal reflux disease 04/15/2018    Past Surgical History:  Procedure Laterality Date  . wisdom teth removal         Family History  Problem Relation Age of Onset  . Diabetes Mother   . Hypertension Mother   . Prostate cancer Father   . Diabetes Sister   . Migraines Sister     Social History   Tobacco Use  . Smoking status: Current Every Day Smoker    Packs/day: 0.50    Types: Cigarettes  . Smokeless tobacco: Never Used  . Tobacco comment: previous marijuana smoker  Vaping Use  . Vaping Use: Former  Substance Use Topics  . Alcohol use: Not Currently  . Drug use: No    Home Medications Prior to Admission medications   Medication Sig Start Date End Date Taking? Authorizing Provider  alum & mag hydroxide-simeth (MAALOX ADVANCED MAX ST) 400-400-40 MG/5ML suspension Take 10 mLs by mouth every 6 (six) hours as needed for indigestion. 11/21/20  Yes Norva Bowe, PA-C  atorvastatin (LIPITOR) 40 MG tablet TAKE 1 TABLET (40 MG TOTAL) BY MOUTH DAILY. 11/15/20   Elige Radon, MD  pantoprazole (PROTONIX) 20 MG tablet Take 1 tablet (20 mg total) by mouth daily. 11/21/20  Yes Dwanna Goshert, PA-C  acetaminophen (TYLENOL) 500 MG tablet Take 500 mg by mouth every 6 (  six) hours as needed (pain).    [provider]  hydrochlorothiazide (HYDRODIURIL) 12.5 MG tablet TAKE 1 TABLET (12.5 MG TOTAL) BY MOUTH DAILY. 07/27/19   Lanelle Bal, MD  lisinopril (ZESTRIL) 20 MG tablet TAKE 1 TABLET (20 MG TOTAL) BY MOUTH DAILY. 01/25/20 01/24/21  Inez Catalina, MD  nicotine polacrilex (NICORETTE) 4 MG gum Take 1 each (4 mg total) by mouth as needed for smoking cessation. 11/12/18   Lanelle Bal, MD  ondansetron (ZOFRAN ODT) 4 MG disintegrating tablet 4mg  ODT q4 hours prn nausea/vomit 11/14/20    11/16/20, DO  promethazine (PHENERGAN) 25 MG suppository Place 1 suppository (25 mg total) rectally every 6 (six) hours as needed for nausea or vomiting. 11/14/20   11/16/20, DO  sertraline (ZOLOFT) 50 MG tablet Take 1 tablet (50 mg total) by mouth 2 (two) times daily. 05/18/19   05/20/19, MD    Allergies    Patient has no known allergies.  Review of Systems   Review of Systems  Gastrointestinal: Positive for abdominal pain, nausea and vomiting.  All other systems reviewed and are negative.   Physical Exam Updated Vital Signs BP (!) 138/92 (BP Location: Right Arm)   Pulse 65   Temp 98.6 F (37 C) (Oral)   Resp 18   SpO2 94%   Physical Exam Vitals and nursing note reviewed.  Constitutional:      General: He is not in acute distress.    Appearance: He is well-developed.     Comments: Appears nontoxic  HENT:     Head: Normocephalic and atraumatic.     Mouth/Throat:     Mouth: Mucous membranes are moist.  Eyes:     Conjunctiva/sclera: Conjunctivae normal.     Pupils: Pupils are equal, round, and reactive to light.  Cardiovascular:     Rate and Rhythm: Normal rate and regular rhythm.     Pulses: Normal pulses.  Pulmonary:     Effort: Pulmonary effort is normal. No respiratory distress.     Breath sounds: Normal breath sounds. No wheezing.  Abdominal:     General: There is no distension.     Palpations: Abdomen is soft. There is no mass.     Tenderness: There is no abdominal tenderness. There is no guarding or rebound.     Comments: No TTP of the abdomen.  No rigidity, guarding, distention.  Negative rebound.  No peritonitis.  Musculoskeletal:        General: Normal range of motion.     Cervical back: Normal range of motion and neck supple.  Skin:    General: Skin is warm and dry.  Neurological:     Mental Status: He is alert and oriented to person, place, and time.     ED Results / Procedures / Treatments   Labs (all labs ordered are listed, but  only abnormal results are displayed) Labs Reviewed  LIPASE, BLOOD - Abnormal; Notable for the following components:      Result Value   Lipase 52 (*)    All other components within normal limits  COMPREHENSIVE METABOLIC PANEL - Abnormal; Notable for the following components:   Glucose, Bld 107 (*)    Creatinine, Ser 1.26 (*)    Calcium 8.6 (*)    All other components within normal limits  CBC - Abnormal; Notable for the following components:   WBC 17.3 (*)    All other components within normal limits  URINALYSIS, ROUTINE W REFLEX MICROSCOPIC - Abnormal; Notable  for the following components:   Color, Urine STRAW (*)    Leukocytes,Ua TRACE (*)    All other components within normal limits    EKG None  Radiology No results found.  Procedures Procedures   Medications Ordered in ED Medications  pantoprazole (PROTONIX) injection 40 mg (40 mg Intravenous Given 11/21/20 1357)  alum & mag hydroxide-simeth (MAALOX/MYLANTA) 200-200-20 MG/5ML suspension 30 mL (30 mLs Oral Given 11/21/20 1357)    And  lidocaine (XYLOCAINE) 2 % viscous mouth solution 15 mL (15 mLs Oral Given 11/21/20 1357)  ondansetron (ZOFRAN) injection 4 mg (4 mg Intravenous Given 11/21/20 1357)  sodium chloride 0.9 % bolus 1,000 mL (1,000 mLs Intravenous Bolus from Bag 11/21/20 1358)    ED Course  I have reviewed the triage vital signs and the nursing notes.  Pertinent labs & imaging results that were available during my care of the patient were reviewed by me and considered in my medical decision making (see chart for details).    MDM Rules/Calculators/A&P                          Patient presented for evaluation nausea, vomiting abdominal pain.  On exam, patient peers nontoxic.  Symptoms resolved prior to my evaluation.  Lab obtained from triage interpreted by me, overall reassuring.  He does have leukocytosis of 17, however this is similar if not slightly better than it has been over the past couple ER visits.  Lipase is  minimally elevated at 52, however have low suspicion for pancreatitis at this time.  Urine without signs of infection.  On reassessment after medications, he reports continued improvement of his symptoms.  As his symptoms worsen with anxiety/altercations near him, consider anxiety as cause.  Also consider IBS.  As symptoms are mostly epigastric and intermittent, consider PUD/gastritis/GERD.  Discussed with patient continued symptomatic treatment at home and importance of follow-up with GI.  At this time, patient appears safe for discharge.  Return precautions given.  Patient states he understands and agrees plan  Final Clinical Impression(s) / ED Diagnoses Final diagnoses:  Upper abdominal pain  Non-intractable vomiting with nausea, unspecified vomiting type    Rx / DC Orders ED Discharge Orders         Ordered    pantoprazole (PROTONIX) 20 MG tablet  Daily        11/21/20 1502    alum & mag hydroxide-simeth (MAALOX ADVANCED MAX ST) 400-400-40 MG/5ML suspension  Every 6 hours PRN        11/21/20 1502           Laden Fieldhouse, PA-C 11/21/20 1518    [name], MD 11/21/20 1724

## 2020-11-21 NOTE — Discharge Instructions (Addendum)
Continue taking home medications as prescribed. Start taking Protonix daily to decrease stomach acid. Use Maalox as needed for breakthrough pain. Continue using Zofran as needed for nausea or vomiting. Stop smoking marijuana, as this is likely contributing to your nausea and vomiting. Follow-up with the GI doctor for further evaluation of your symptoms.  You may contact the clinic listed below, or you may get a referral from your primary care doctor.  Follow-up with your primary care doctor regarding your night sweats as they have already begun to work this up.

## 2020-11-21 NOTE — ED Triage Notes (Signed)
Pt from home for continued abdominal pain and n/v/d x several months. Seen twice for same within the week. States rx meds are not helping, only the IV pain meds. Also reports possible infection from where he had a tooth removed last year-states he has a blister and drainage from same.

## 2020-12-06 ENCOUNTER — Encounter: Payer: Medicaid Other | Admitting: Internal Medicine

## 2020-12-13 ENCOUNTER — Encounter (HOSPITAL_COMMUNITY): Payer: Self-pay

## 2020-12-13 ENCOUNTER — Other Ambulatory Visit: Payer: Self-pay

## 2020-12-13 ENCOUNTER — Emergency Department (HOSPITAL_COMMUNITY): Payer: Medicaid Other

## 2020-12-13 ENCOUNTER — Emergency Department (HOSPITAL_COMMUNITY)
Admission: EM | Admit: 2020-12-13 | Discharge: 2020-12-13 | Disposition: A | Discharge status: Home / Self Care | Payer: Medicaid Other | Attending: Emergency Medicine | Admitting: Emergency Medicine

## 2020-12-13 DIAGNOSIS — R61 Generalized hyperhidrosis: Secondary | ICD-10-CM | POA: Diagnosis not present

## 2020-12-13 DIAGNOSIS — F17210 Nicotine dependence, cigarettes, uncomplicated: Secondary | ICD-10-CM | POA: Insufficient documentation

## 2020-12-13 DIAGNOSIS — R1013 Epigastric pain: Secondary | ICD-10-CM | POA: Diagnosis present

## 2020-12-13 DIAGNOSIS — I10 Essential (primary) hypertension: Secondary | ICD-10-CM | POA: Diagnosis not present

## 2020-12-13 DIAGNOSIS — Z79899 Other long term (current) drug therapy: Secondary | ICD-10-CM | POA: Insufficient documentation

## 2020-12-13 DIAGNOSIS — K2980 Duodenitis without bleeding: Secondary | ICD-10-CM

## 2020-12-13 LAB — CBC WITH DIFFERENTIAL/PLATELET
Abs Immature Granulocytes: 0.05 10*3/uL (ref 0.00–0.07)
Basophils Absolute: 0.1 10*3/uL (ref 0.0–0.1)
Basophils Relative: 0 %
Eosinophils Absolute: 0.1 10*3/uL (ref 0.0–0.5)
Eosinophils Relative: 1 %
HCT: 43 % (ref 39.0–52.0)
Hemoglobin: 14.2 g/dL (ref 13.0–17.0)
Immature Granulocytes: 0 %
Lymphocytes Relative: 19 %
Lymphs Abs: 2.8 10*3/uL (ref 0.7–4.0)
MCH: 27 pg (ref 26.0–34.0)
MCHC: 33 g/dL (ref 30.0–36.0)
MCV: 81.9 fL (ref 80.0–100.0)
Monocytes Absolute: 1 10*3/uL (ref 0.1–1.0)
Monocytes Relative: 7 %
Neutro Abs: 11 10*3/uL — ABNORMAL HIGH (ref 1.7–7.7)
Neutrophils Relative %: 73 %
Platelets: 389 10*3/uL (ref 150–400)
RBC: 5.25 MIL/uL (ref 4.22–5.81)
RDW: 14.5 % (ref 11.5–15.5)
WBC: 15 10*3/uL — ABNORMAL HIGH (ref 4.0–10.5)
nRBC: 0 % (ref 0.0–0.2)

## 2020-12-13 LAB — URINALYSIS, ROUTINE W REFLEX MICROSCOPIC
Bilirubin Urine: NEGATIVE
Glucose, UA: NEGATIVE mg/dL
Hgb urine dipstick: NEGATIVE
Ketones, ur: NEGATIVE mg/dL
Leukocytes,Ua: NEGATIVE
Nitrite: NEGATIVE
Protein, ur: NEGATIVE mg/dL
Specific Gravity, Urine: 1.033 — ABNORMAL HIGH (ref 1.005–1.030)
pH: 9 — ABNORMAL HIGH (ref 5.0–8.0)

## 2020-12-13 LAB — COMPREHENSIVE METABOLIC PANEL
ALT: 27 U/L (ref 0–44)
AST: 23 U/L (ref 15–41)
Albumin: 3.3 g/dL — ABNORMAL LOW (ref 3.5–5.0)
Alkaline Phosphatase: 94 U/L (ref 38–126)
Anion gap: 10 (ref 5–15)
BUN: 8 mg/dL (ref 6–20)
CO2: 22 mmol/L (ref 22–32)
Calcium: 8.7 mg/dL — ABNORMAL LOW (ref 8.9–10.3)
Chloride: 104 mmol/L (ref 98–111)
Creatinine, Ser: 1.33 mg/dL — ABNORMAL HIGH (ref 0.61–1.24)
GFR, Estimated: >60 mL/min (ref >60)
Glucose, Bld: 130 mg/dL — ABNORMAL HIGH (ref 70–99)
Potassium: 4.4 mmol/L (ref 3.5–5.1)
Sodium: 136 mmol/L (ref 135–145)
Total Bilirubin: 0.6 mg/dL (ref 0.3–1.2)
Total Protein: 6.3 g/dL — ABNORMAL LOW (ref 6.5–8.1)

## 2020-12-13 LAB — TROPONIN I (HIGH SENSITIVITY)
Troponin I (High Sensitivity): 7 ng/L (ref <18)
Troponin I (High Sensitivity): 4 ng/L (ref <18)

## 2020-12-13 LAB — LIPASE, BLOOD: Lipase: 33 U/L (ref 11–51)

## 2020-12-13 MED ORDER — SUCRALFATE 1 GM/10ML PO SUSP: 1.0000 g | Freq: Three times a day (TID) | ORAL | 0 refills | Status: DC | PRN

## 2020-12-13 MED ORDER — SODIUM CHLORIDE 0.9 % IV BOLUS
1000.0000 mL | Freq: Once | INTRAVENOUS | Status: AC
  Administered 2020-12-13: 1000 mL via INTRAVENOUS

## 2020-12-13 MED ORDER — PANTOPRAZOLE SODIUM 40 MG PO TBEC: 40.0000 mg | DELAYED_RELEASE_TABLET | Freq: Every day | ORAL | 0 refills | Status: DC

## 2020-12-13 MED ORDER — LIDOCAINE VISCOUS HCL 2 % MT SOLN
15.0000 mL | Freq: Once | OROMUCOSAL | Status: AC
  Administered 2020-12-13: 15 mL via ORAL
  Filled 2020-12-13: qty 15

## 2020-12-13 MED ORDER — ONDANSETRON HCL 4 MG/2ML IJ SOLN
4.0000 mg | Freq: Once | INTRAMUSCULAR | Status: AC
  Administered 2020-12-13: 4 mg via INTRAVENOUS
  Filled 2020-12-13: qty 2

## 2020-12-13 MED ORDER — IOHEXOL 300 MG/ML  SOLN
100.0000 mL | Freq: Once | INTRAMUSCULAR | Status: AC | PRN
  Administered 2020-12-13: 100 mL via INTRAVENOUS

## 2020-12-13 MED ORDER — FENTANYL CITRATE (PF) 100 MCG/2ML IJ SOLN
50.0000 ug | Freq: Once | INTRAMUSCULAR | Status: AC
  Administered 2020-12-13: 50 ug via INTRAVENOUS
  Filled 2020-12-13: qty 2

## 2020-12-13 MED ORDER — ALUM & MAG HYDROXIDE-SIMETH 200-200-20 MG/5ML PO SUSP
30.0000 mL | Freq: Once | ORAL | Status: AC
  Administered 2020-12-13: 30 mL via ORAL
  Filled 2020-12-13: qty 30

## 2020-12-13 NOTE — ED Provider Notes (Signed)
[facility] EMERGENCY DEPARTMENT Provider Note   CSN: [id number] Arrival date & time: 12/13/20  0957     History Chief Complaint  Patient presents with  . Abdominal Pain    Bruce [name] is a 45 y.o. male who presents with concern for central abdominal pain that he describes as sharp and burning, 9/10.  Patient states that his pain started approximately 1 hour prior to his arrival in the ED.  He thought he may be hungry so he had to pull on examination some water which only worsened his pain.  He had 1 small episode of NBNB emesis per EMS but states he otherwise has not had any vomiting.  Patient with history of recurring ED visits for this same presentation.  Patient states that he has been taking the Pepcid which was prescribed to him with improvement in symptoms.  Unfortunately he was unable to follow-up with GI due to missing his appointment due to an incarceration.  Denies any fevers or chills at home.  He denies any constipation or diarrhea, denies melena or hematochezia.  Denies any urinary symptoms.  Patient does endorse low chest pain today but denies any shortness of breath.  Patient is diaphoretic at time of my initial exam.  No history of cardiac issues.  I personally reviewed this patient's medical records he has been seen multiple times in the emergency department for epigastric pain with associated nausea and vomiting.  He does endorse marijuana usage.   HPI     Past Medical History:  Diagnosis Date  . Anxiety disorder   . At risk for obstructive sleep apnea 04/15/2018  . Bipolar 1 disorder (HCC)   . Depression   . Hepatitis B immune 04/15/2018  . Hypertension   . Schizophrenia [facility])     Patient Active Problem List   Diagnosis Date Noted  . Unexplained night sweats 10/13/2019  . Chronic headaches 10/13/2019  . Dizziness 10/13/2019  . Earlobe lesion, left 07/15/2019  . HLD (hyperlipidemia) 03/12/2019  . 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 Finger pain, left 01/20/2019  . HTN (hypertension) 11/12/2018  . Tobacco use disorder 05/20/2018  . Schizophrenia (HCC) 04/15/2018  . Bipolar disorder (HCC) 04/15/2018  . Anxiety disorder 04/15/2018  . Depression 04/15/2018  . Gastroesophageal reflux disease 04/15/2018    Past Surgical History:  Procedure Laterality Date  . wisdom teth removal         Family History  Problem Relation Age of Onset  . Diabetes Mother   . Hypertension Mother   . Prostate cancer Father   . Diabetes Sister   . Migraines Sister     Social History   Tobacco Use  . Smoking status: Current Every Day Smoker    Packs/day: 0.50    Types: Cigarettes  . Smokeless tobacco: Never Used  . Tobacco comment: previous marijuana smoker  Vaping Use  . Vaping Use: Former  Substance Use Topics  . Alcohol use: Not Currently  . Drug use: No    Home Medications Prior to Admission medications   Medication Sig Start Date End Date Taking? Authorizing Provider  atorvastatin (LIPITOR) 40 MG tablet TAKE 1 TABLET (40 MG TOTAL) BY MOUTH DAILY. 11/15/20   Elige Radon, MD  acetaminophen (TYLENOL) 500 MG tablet Take 500 mg by mouth every 6 (six) hours as needed (pain).    [provider]  alum & mag hydroxide-simeth (MAALOX ADVANCED MAX ST) 400-400-40 MG/5ML suspension Take 10 mLs  by mouth every 6 (six) hours as needed for indigestion. 11/21/20   Caccavale, Sophia, PA-C  hydrochlorothiazide (HYDRODIURIL) 12.5 MG tablet TAKE 1 TABLET (12.5 MG TOTAL) BY MOUTH DAILY. 07/27/19   Lanelle Bal, MD  lisinopril (ZESTRIL) 20 MG tablet TAKE 1 TABLET (20 MG TOTAL) BY MOUTH DAILY. 01/25/20 01/24/21  Inez Catalina, MD  nicotine polacrilex (NICORETTE) 4 MG gum Take 1 each (4 mg total) by mouth as needed for smoking cessation. 11/12/18   Lanelle Bal, MD  ondansetron (ZOFRAN ODT) 4 MG disintegrating tablet  ODT q4 hours prn nausea/vomit 11/14/20   Melene Plan, DO   pantoprazole (PROTONIX) 20 MG tablet Take 1 tablet (20 mg total) by mouth daily. 11/21/20   Caccavale, Sophia, PA-C  promethazine (PHENERGAN) 25 MG suppository Place 1 suppository (25 mg total) rectally every 6 (six) hours as needed for nausea or vomiting. 11/14/20   Melene Plan, DO  sertraline (ZOLOFT) 50 MG tablet Take 1 tablet (50 mg total) by mouth 2 (two) times daily. 05/18/19   Lanelle Bal, MD    Allergies    Patient has no known allergies.  Review of Systems   Review of Systems  Constitutional: Positive for diaphoresis. Negative for activity change, appetite change, chills, fatigue and fever.  HENT: Negative.   Respiratory: Negative.   Cardiovascular: Positive for chest pain. Negative for palpitations and leg swelling.  Gastrointestinal: Positive for abdominal pain, nausea and vomiting. Negative for blood in stool, constipation and diarrhea.  Genitourinary: Negative.   Musculoskeletal: Negative.   Neurological: Negative.   Hematological: Negative.     Physical Exam Updated Vital Signs BP 123/85   Pulse 67   Temp (!) 97.5 F (36.4 C) (Oral)   Resp 16   Ht  (1.93 m)   Wt 110.2 kg   SpO2 96%   BMI 29.58 kg/m   Physical Exam Vitals and nursing note reviewed.  Constitutional:      Appearance: He is diaphoretic. He is not toxic-appearing.  HENT:     Head: Normocephalic and atraumatic.     Nose: Nose normal.     Mouth/Throat:     Mouth: Mucous membranes are moist.     Pharynx: Oropharynx is clear. Uvula midline. No oropharyngeal exudate, posterior oropharyngeal erythema or uvula swelling.     Tonsils: No tonsillar exudate.  Eyes:     General: Lids are normal. Vision grossly intact. No scleral icterus.       Right eye: No discharge.        Left eye: No discharge.     Conjunctiva/sclera: Conjunctivae normal.  Neck:     Trachea: Trachea and phonation normal.  Cardiovascular:     Rate and Rhythm: Normal rate and regular rhythm.     Pulses: Normal pulses.      Heart sounds: Normal heart sounds. No murmur heard.   Pulmonary:     Effort: Pulmonary effort is normal. No tachypnea, bradypnea, accessory muscle usage or respiratory distress.     Breath sounds: Normal breath sounds. No wheezing or rales.  Chest:     Chest wall: No mass, lacerations, deformity, swelling, tenderness, crepitus or edema.  Abdominal:     General: Bowel sounds are normal. There is no distension.     Palpations: Abdomen is soft.     Tenderness: There is abdominal tenderness in the epigastric area, left upper quadrant and left lower quadrant. There is guarding. There is no right CVA tenderness, left CVA tenderness or rebound.  Musculoskeletal:  General: No deformity.     Cervical back: Neck supple. No rigidity or crepitus. No pain with movement, spinous process tenderness or muscular tenderness.     Right lower leg: No edema.     Left lower leg: No edema.  Lymphadenopathy:     Cervical: No cervical adenopathy.  Skin:    General: Skin is warm.  Neurological:     Mental Status: He is alert. Mental status is at baseline.  Psychiatric:        Mood and Affect: Mood normal.     Comments: Patient keeping his eyes closed, whispering when speaking to this provider.  States this is due to the pain.     ED Results / Procedures / Treatments   Labs (all labs ordered are listed, but only abnormal results are displayed) Labs Reviewed  COMPREHENSIVE METABOLIC PANEL - Abnormal; Notable for the following components:      Result Value   Glucose, Bld 130 (*)    Creatinine, Ser 1.33 (*)    Calcium 8.7 (*)    Total Protein 6.3 (*)    Albumin 3.3 (*)    All other components within normal limits  CBC WITH DIFFERENTIAL/PLATELET - Abnormal; Notable for the following components:   WBC 15.0 (*)    Neutro Abs 11.0 (*)    All other components within normal limits  URINALYSIS, ROUTINE W REFLEX MICROSCOPIC - Abnormal; Notable for the following components:   Specific Gravity, Urine  1.033 (*)    pH 9.0 (*)    All other components within normal limits  LIPASE, BLOOD  TROPONIN I (HIGH SENSITIVITY)  TROPONIN I (HIGH SENSITIVITY)    EKG EKG Interpretation  Date/Time:  Tuesday December 13 2020 10:47:02 EDT Ventricular Rate:  64 PR Interval:  209 QRS Duration: 77 QT Interval:  412 QTC Calculation: 426 R Axis:   85 Text Interpretation: Sinus rhythm Borderline prolonged PR interval ST elev, probable normal early repol pattern similar to Mar 2022 Confirmed by Pricilla Loveless [phone]) on 12/13/2020 1:49:11 PM   Radiology CT ABDOMEN PELVIS W CONTRAST  Result Date: 12/13/2020 CLINICAL DATA:  Abdominal pain, primarily left-sided EXAM: CT ABDOMEN AND PELVIS WITH CONTRAST TECHNIQUE: Multidetector CT imaging of the abdomen and pelvis was performed using the standard protocol following bolus administration of intravenous contrast. CONTRAST:  OMNIPAQUE IOHEXOL 300 MG/ML  SOLN COMPARISON:  November 14, 2020 FINDINGS: Lower chest: There is posterior left base atelectasis. No lung base edema or consolidation. Hepatobiliary: No focal liver lesions are appreciable. Liver measures 19.9 cm in length. Gallbladder wall is not appreciably thickened. There is no biliary duct dilatation. Pancreas: There is no pancreatic mass or inflammatory focus. Spleen: No splenic lesions are evident. Adrenals/Urinary Tract: Adrenals bilaterally appear normal. No evident renal mass or hydronephrosis on either side. There is no appreciable renal or ureteral calculus on either side. The urinary bladder is midline. There is evidence of a degree of urinary bladder wall thickening. Stomach/Bowel: Moderate stool noted in the colon. There is slight thickening of the wall of the third portion of the duodenum. No other bowel wall thickening is noted. No evident bowel obstruction. The terminal ileum appears normal. Appendix appears normal. No free air or portal venous air. Vascular/Lymphatic: No evident abdominal aortic  aneurysm. Mild calcification noted in common iliac arteries. Major venous structures appear patent. No evident adenopathy in the abdomen or pelvis. Reproductive: Prostate and seminal vesicles normal in size and contour. Occasional prostatic calculi noted. Other: Fat noted in each inguinal  ring. No bowel containing hernia. No abscess or ascites evident in the abdomen or pelvis. Musculoskeletal: Degenerative change present at L5-S1. There is vacuum phenomenon at this level. There is spinal stenosis at L4-5 due to disc protrusion and bony hypertrophy. Degenerative change noted in each sacroiliac joint. No blastic or lytic bone lesions are evident. No abdominal wall or intramuscular lesions apparent. IMPRESSION: 1. Mild wall thickening in the third portion of the duodenum, potentially representing a degree of post bulbar duodenitis. No other bowel wall thickening. No bowel obstruction. 2.  No abscess in the abdomen or pelvis.  Appendix appears normal. 3. Wall thickening of the urinary bladder consistent with a degree of cystitis. 4. No renal or ureteral calculus. No hydronephrosis on either side. 5. Spinal stenosis at L4-5 due to bony hypertrophy and disc protrusion. 6.  Liver prominent without focal liver lesion evident. Electronically Signed   By: Bretta BangWilliam  Woodruff III M.D.   On: 12/13/2020 12:20    Procedures Procedures   Medications Ordered in ED Medications  fentaNYL (SUBLIMAZE) injection 50 mcg (50 mcg Intravenous Given 12/13/20 1037)  sodium chloride 0.9 % bolus 1,000 mL (0 mLs Intravenous Stopped 12/13/20 1237)  ondansetron (ZOFRAN) injection 4 mg (4 mg Intravenous Given 12/13/20 1124)  iohexol (OMNIPAQUE) 300 MG/ML solution 100 mL (100 mLs Intravenous Contrast Given 12/13/20 1151)  alum & mag hydroxide-simeth (MAALOX/MYLANTA) 200-200-20 MG/5ML suspension 30 mL (30 mLs Oral Given 12/13/20 1300)    And  lidocaine (XYLOCAINE) 2 % viscous mouth solution 15 mL (15 mLs Oral Given 12/13/20 1300)    ED  Course  I have reviewed the triage vital signs and the nursing notes.  Pertinent labs & imaging results that were available during my care of the patient were reviewed by me and considered in my medical decision making (see chart for details).    MDM Rules/Calculators/A&P                          45 year old male with history of epigastric pain who presents to the emergency department with sudden severe epigastric pain and left-sided abdominal pain that began 1 hour prior to arrival.  Single episode of NBNB emesis.  On Protonix.  Probable right differential diagnosis for this patient symptoms includes but is not limited to gastritis, GERD, esophagitis, pancreatitis, cholecystitis/cholangitis, mesenteric ischemia, ACS, splenic infarct, aortic dissection, pneumonia, muscular injury.  Diaphoretic and hypertensive on intake.  Cardiopulmonary exam is normal, abdominal exam with exquisite tenderness palpation of the epigastrium and left upper quadrant as well as tenderness palpation of the left lower quadrant.  Mild guarding.  No rebound.  Patient is diaphoretic at time initial exam.  HEENT exam unremarkable.  We will proceed with basic laboratory studies.  We will also proceed with CT abdomen pelvis due to tenderness palpation of the left side of the abdomen as well as the epigastrium.  CBC with leukocytosis of 15,000, CMP with baseline creatinine of 1.3.  Lipase is normal, troponin is negative, 7.  EKG with sinus rhythm, no STEMI.  CT abdomen pelvis revealed duodenitis without abscess.  Additionally there is thickening of the urinary bladder concerning for possible degree of cystitis.  Spinal stenosis at L4-L5 due to bony hypertrophy and disc protrusion.  Patient reevaluated with significant improvement in his symptoms.  He states that his pain is resolved at this time.  Patient did take his Protonix today at home.  Will administer GI cocktail while awaiting UA.  Additionally will discharge  with  high-dose Protonix x14 days and recommendation for close GI follow-up.  UA unremarkable.  No further work-up warranted in the ED at this time given reassuring physical exam, vital signs, and imaging studies with clear source for patient's symptoms.    Bruce [name] voiced understanding of his medical evaluation and treatment plan.  Each of his questions was answered to his expressed satisfaction.  Return precautions given.  Patient is well-appearing, stable, and appropriate for discharge at this time.  This chart was dictated using voice recognition software, Dragon. Despite the best efforts of this provider to proofread and correct errors, errors may still occur which can change documentation meaning.  Final Clinical Impression(s) / ED Diagnoses Final diagnoses:  Duodenitis    Rx / DC Orders ED Discharge Orders    None       Sherrilee Gilles 12/13/20 1409    Pricilla Loveless, MD 12/14/20 [phone]

## 2020-12-13 NOTE — ED Triage Notes (Signed)
Pt bib ems from mother home. C/o abdomen pain denies diarrhea and small emesis with ems. Pt rate  8/10 pain. Pt stated he ate bologna sandwich about an hour ago with water. VS bp 140/90 hr 80 98% room air rr 24  Pt stated he has been taking prescribe Pepcid with some relief.

## 2020-12-13 NOTE — Discharge Instructions (Addendum)
You were seen in the ER today for your abdominal pain and nausea.  You were found of inflammation of the duodenum which is the first part of your small intestine connected to your stomach.  You have been prescribed a new dose of the Protonix, which is double your prior dose.  Please discontinue the protonix you have at home and start taking the new stronger dose.  Additionally you have been prescribed a medication called Carafate which you may drink to coat and soothe your stomach up to 3 times a day as needed for severe pain.  Below is contact information for gastroenterology with whom you should follow-up soon as possible.  Return to the emergency department develop worsening abdominal pain, nausea or vomiting that does not stop, Or any other new severe symptoms.

## 2020-12-15 ENCOUNTER — Encounter: Payer: Medicaid Other | Admitting: Internal Medicine

## 2020-12-29 ENCOUNTER — Telehealth: Payer: Self-pay

## 2020-12-29 NOTE — Telephone Encounter (Signed)
Return pt's call - no answer; continues to ring unable to leave a message.

## 2020-12-29 NOTE — Telephone Encounter (Signed)
Please call back for med refill.

## 2021-01-20 ENCOUNTER — Other Ambulatory Visit: Payer: Self-pay | Admitting: Internal Medicine

## 2021-01-20 DIAGNOSIS — I10 Essential (primary) hypertension: Secondary | ICD-10-CM

## 2021-03-02 ENCOUNTER — Other Ambulatory Visit: Payer: Self-pay | Admitting: Internal Medicine

## 2021-03-02 DIAGNOSIS — I10 Essential (primary) hypertension: Secondary | ICD-10-CM

## 2021-03-09 ENCOUNTER — Encounter: Payer: Medicaid Other | Admitting: Internal Medicine

## 2021-03-16 ENCOUNTER — Ambulatory Visit: Payer: Medicaid Other | Admitting: Internal Medicine

## 2021-03-16 ENCOUNTER — Ambulatory Visit: Payer: Medicaid Other | Admitting: Behavioral Health

## 2021-03-16 DIAGNOSIS — K219 Gastro-esophageal reflux disease without esophagitis: Secondary | ICD-10-CM | POA: Diagnosis not present

## 2021-03-16 DIAGNOSIS — F419 Anxiety disorder, unspecified: Secondary | ICD-10-CM

## 2021-03-16 DIAGNOSIS — I10 Essential (primary) hypertension: Secondary | ICD-10-CM | POA: Diagnosis not present

## 2021-03-16 DIAGNOSIS — E785 Hyperlipidemia, unspecified: Secondary | ICD-10-CM

## 2021-03-16 DIAGNOSIS — F32A Depression, unspecified: Secondary | ICD-10-CM

## 2021-03-16 DIAGNOSIS — F331 Major depressive disorder, recurrent, moderate: Secondary | ICD-10-CM

## 2021-03-16 DIAGNOSIS — F3170 Bipolar disorder, currently in remission, most recent episode unspecified: Secondary | ICD-10-CM

## 2021-03-16 NOTE — Patient Instructions (Addendum)
It was nice seeing you today! Thank you for choosing [facility] for your Primary Care.    Today we talked about:   Anxiety and Depression: This is having a big effect on your body, such as the headaches, nausea, vomiting. I would call Merlyn Albert as soon as you can to schedule an appointment for medication adjustment. Let him know your medications are not working for you!   I have also asked Dr. Monna Fam to come say hello. I am so glad you two had a chance to speak! I think Dr. Monna Fam will be a great resource for you.   I will be back in the clinic in November. Please schedule a follow up.

## 2021-03-17 MED ORDER — ATORVASTATIN CALCIUM 40 MG PO TABS: 40.0000 mg | ORAL_TABLET | Freq: Every day | ORAL | 1 refills | Status: DC

## 2021-03-17 MED ORDER — PANTOPRAZOLE SODIUM 40 MG PO TBEC: 40.0000 mg | DELAYED_RELEASE_TABLET | Freq: Every day | ORAL | 5 refills | Status: DC

## 2021-03-17 NOTE — Assessment & Plan Note (Signed)
Bruce [name] describes continued acid reflux.  He is not currently taking any PPIs.  Assessment/plan: - Start pantoprazole 40 mg daily

## 2021-03-17 NOTE — Progress Notes (Signed)
Internal Medicine Clinic Attending  Case discussed with Dr. Basaraba  At the time of the visit.  We reviewed the resident's history and exam and pertinent patient test results.  I agree with the assessment, diagnosis, and plan of care documented in the resident's note.  

## 2021-03-17 NOTE — Assessment & Plan Note (Signed)
BP: 134/87   Patient is currently taking lisinopril 20 mg daily.  Blood pressure is adequately controlled at this time.  No medication changes made.  -Continue lisinopril 20 mg daily

## 2021-03-17 NOTE — Progress Notes (Signed)
   CC: Depression; anxiety  HPI:  [name] is a 45 y.o. with a PMHx as listed below who presents to the clinic for Depression; anxiety.   Please see the Encounters tab for problem-based Assessment & Plan regarding status of patient's acute and chronic conditions.  Past Medical History:  Diagnosis Date   Anxiety disorder    At risk for obstructive sleep apnea 04/15/2018   Bipolar 1 disorder (HCC)    Depression    Hepatitis B immune 04/15/2018   Hypertension    Schizophrenia (HCC)    Review of Systems: Review of Systems  Constitutional:  Negative for chills and fever.  Respiratory:  Negative for shortness of breath.   Cardiovascular:  Positive for chest pain and palpitations. Negative for leg swelling.  Gastrointestinal:  Positive for nausea.  Neurological:  Positive for dizziness and headaches. Negative for focal weakness and weakness.  Psychiatric/Behavioral:  Positive for depression. Negative for substance abuse and suicidal ideas. The patient is nervous/anxious and has insomnia.    Physical Exam:  Vitals:   03/16/21 1409  BP: 134/87  Pulse: 75  Temp: 98.9 F (37.2 C)  TempSrc: Oral  SpO2: 100%  Weight: 266 lb (120.7 kg)  Height: 6\' 5"  (1.956 m)   Physical Exam Vitals and nursing note reviewed.  Constitutional:      Appearance: He is obese.  HENT:     Head: Normocephalic and atraumatic.  Pulmonary:     Effort: Pulmonary effort is normal. No respiratory distress.  Skin:    General: Skin is warm and dry.  Neurological:     Mental Status: He is alert and oriented to person, place, and time. Mental status is at baseline.     Gait: Gait normal.  Psychiatric:        Attention and Perception: Attention normal.        Mood and Affect: Mood is anxious and depressed. Affect is tearful.        Speech: Speech normal.        Behavior: Behavior normal. Behavior is cooperative.        Thought Content: Thought content normal. Thought content is not paranoid. Thought  content does not include homicidal or suicidal ideation. Thought content does not include homicidal or suicidal plan.        Cognition and Memory: Cognition normal.        Judgment: Judgment normal.    Assessment & Plan:   See Encounters Tab for problem based charting.  Patient discussed with Dr. 
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
## 2021-03-17 NOTE — Assessment & Plan Note (Signed)
Mr. Geer is currently taking atorvastatin 40 mg daily.  His last lipid panel was obtained in 2020 that demonstrated an LDL of 78.  We will need to recheck this at his next visit to reassess his ASCVD risk score.  For now we will continue atorvastatin.

## 2021-03-30 NOTE — BH Specialist Note (Signed)
Integrated Behavioral Health Initial In-Person Visit  MRN: [id number] Name: Bruce [name]  Number of Integrated Behavioral Health Clinician visits:: 1/6 Session Start time: 9:45am  Session End time: 10:15am Total time: 30 minutes  Types of Service: Health Promotion and Introduction only  Interpretor:No. Interpretor Name and Language: n/a    Warm Hand Off Completed.        Subjective: Bruce [name] is a 45 y.o. male accompanied by  self Patient was referred by Dr. Huel Cote, MD for anx/dep, emot'l overwhelm, & Family living situation & related conflicts. Patient reports the following symptoms/concerns: anx/dep & conflictual relationship w/GF. There are multiple children in the home w/the expected Family chaos this can create. Related this number of children & the expected mix of needs can cause a great deal of stress for Parents. Suggested to Pt when Parents themselves are in conflict-the children are directly impacted by this & it will cause greater volume & upset in the home.  Pt reports multiple efforts in his Fatherhood. Validated Pt role as Father & the responsibilities he manages in the home.  Duration of problem: months to yrs; Severity of problem: severe distress @ this time  Objective: Mood: Anxious and tearful upset  and Affect: Labile, Tearful, and presenting congruent w/mood Risk of harm to self or others: No plan to harm self or others, & Pt lacking role confidence validation @ this time  Life Context: Family and Social: Pt lives w/GF & 6-8 children in the home @ any one time. Pt is currently not working which cause conflict btwn him & GF who does work. Pt reports he makes constant efforts to reduce conflict in the home, but GF persists in agitating him to upset. This causes arguments & yelling. This cycle in turn upsets the children who are mostly under 17yrs old. Pt sts that GF is very unreasonable & does minimally for the children. CPS has been invld w/this Family in  the recent past & Pt feels confusion & helplessness for this situation. School/Work: Pt is currently unemployed & works FT w/the children in the home Fathering. Self-Care: Pt does minimal to care for self. Encouraged Pt to put his needs first as he is able. Life Changes: Pt has reached a limitation on his frustration tolerance & is expressing his anguish today.  Patient and/or Family's Strengths/Protective Factors: Social and Emotional competence, Concrete supports in place (healthy food, safe environments, etc.), Sense of purpose, and Caregiver has knowledge of parenting & child development  Goals Addressed: Patient will: Reduce symptoms of: anxiety, depression, and stress Increase knowledge and/or ability of: coping skills, healthy habits, self-management skills, stress reduction, and knowledge of young family dvlpmt across the lifespan trajectory   Demonstrate ability to: Increase healthy adjustment to current life circumstances, Increase adequate support systems for patient/family, and assist Pt in future functioning for self to inc mental health wellness  Progress towards Goals: Estb'd needs today through info gathering & personal sharing  Interventions: Interventions utilized: Solution-Focused Strategies and Supportive Counseling, validation for Fathering role Standardized Assessments completed:  screeners prn  Patient and/or Family Response: Pt receptive & welcoming to Clinician today. Pt hesitant to share, but as session cont'd he began to trust & value the exchange. Pt agrees to future appt  Patient Centered Plan: Patient is on the following Treatment Plan(s):  Pt open to suggestions to care for self & improve his own mental health wellness @ this time. Pt agrees to future sessions & tips/suggestions for Parenting in a home w/8 children @  any one time.   Assessment: Patient currently experiencing moderate to severe adjustment issues to current Family home circumstances. Pt & Sig  Other are often in conflict & Pt is triggered daily by her actions/beh w/the children.   Patient may benefit from intensive Tx for PTSD & how this relates to the home situation. Pt requires adequate tools & coping skills for current adjustment.  Plan: Follow up with behavioral health clinician on : 2-3 wks for 60 min session (Pt did not state preference for f:f or telehealth) Behavioral recommendations: Place a new confidence in your Fatherhood, f/u with Dr. Renaee [name] instructions for f/u with Bruce [name] who is NP for [facility] & manages your medications. Attend our next session to f/u on further concerns & be provided w/psychoedu & support. Referral(s): Integrated [employer] (In Clinic) "From scale of 1-10, how likely are you to follow plan?": 7  Deneise Lever, LMFT

## 2021-04-29 ENCOUNTER — Emergency Department (HOSPITAL_COMMUNITY)
Admission: EM | Admit: 2021-04-29 | Discharge: 2021-04-29 | Disposition: A | Discharge status: Home / Self Care | Payer: Medicaid Other | Attending: Emergency Medicine | Admitting: Emergency Medicine

## 2021-04-29 ENCOUNTER — Other Ambulatory Visit: Payer: Self-pay

## 2021-04-29 ENCOUNTER — Encounter (HOSPITAL_COMMUNITY): Payer: Self-pay

## 2021-04-29 ENCOUNTER — Emergency Department (HOSPITAL_COMMUNITY): Payer: Medicaid Other

## 2021-04-29 DIAGNOSIS — R002 Palpitations: Secondary | ICD-10-CM | POA: Insufficient documentation

## 2021-04-29 DIAGNOSIS — I10 Essential (primary) hypertension: Secondary | ICD-10-CM | POA: Insufficient documentation

## 2021-04-29 DIAGNOSIS — R1012 Left upper quadrant pain: Secondary | ICD-10-CM | POA: Diagnosis present

## 2021-04-29 DIAGNOSIS — R350 Frequency of micturition: Secondary | ICD-10-CM | POA: Diagnosis not present

## 2021-04-29 DIAGNOSIS — K5900 Constipation, unspecified: Secondary | ICD-10-CM | POA: Insufficient documentation

## 2021-04-29 DIAGNOSIS — Z79899 Other long term (current) drug therapy: Secondary | ICD-10-CM | POA: Insufficient documentation

## 2021-04-29 DIAGNOSIS — F17210 Nicotine dependence, cigarettes, uncomplicated: Secondary | ICD-10-CM | POA: Insufficient documentation

## 2021-04-29 DIAGNOSIS — K219 Gastro-esophageal reflux disease without esophagitis: Secondary | ICD-10-CM | POA: Diagnosis not present

## 2021-04-29 LAB — URINALYSIS, ROUTINE W REFLEX MICROSCOPIC
Bilirubin Urine: NEGATIVE
Glucose, UA: NEGATIVE mg/dL
Hgb urine dipstick: NEGATIVE
Ketones, ur: NEGATIVE mg/dL
Leukocytes,Ua: NEGATIVE
Nitrite: NEGATIVE
Protein, ur: NEGATIVE mg/dL
Specific Gravity, Urine: >=1.03 (ref 1.005–1.030)
pH: 6 (ref 5.0–8.0)

## 2021-04-29 LAB — COMPREHENSIVE METABOLIC PANEL
ALT: 29 U/L (ref 0–44)
AST: 24 U/L (ref 15–41)
Albumin: 4.1 g/dL (ref 3.5–5.0)
Alkaline Phosphatase: 114 U/L (ref 38–126)
Anion gap: 9 (ref 5–15)
BUN: 12 mg/dL (ref 6–20)
CO2: 26 mmol/L (ref 22–32)
Calcium: 9.2 mg/dL (ref 8.9–10.3)
Chloride: 103 mmol/L (ref 98–111)
Creatinine, Ser: 1.64 mg/dL — ABNORMAL HIGH (ref 0.61–1.24)
GFR, Estimated: 53 mL/min — ABNORMAL LOW (ref >60)
Glucose, Bld: 112 mg/dL — ABNORMAL HIGH (ref 70–99)
Potassium: 4.2 mmol/L (ref 3.5–5.1)
Sodium: 138 mmol/L (ref 135–145)
Total Bilirubin: 0.5 mg/dL (ref 0.3–1.2)
Total Protein: 7.4 g/dL (ref 6.5–8.1)

## 2021-04-29 LAB — CBC
HCT: 47.5 % (ref 39.0–52.0)
Hemoglobin: 15.4 g/dL (ref 13.0–17.0)
MCH: 27 pg (ref 26.0–34.0)
MCHC: 32.4 g/dL (ref 30.0–36.0)
MCV: 83.2 fL (ref 80.0–100.0)
Platelets: 280 10*3/uL (ref 150–400)
RBC: 5.71 MIL/uL (ref 4.22–5.81)
RDW: 14.2 % (ref 11.5–15.5)
WBC: 12.4 10*3/uL — ABNORMAL HIGH (ref 4.0–10.5)
nRBC: 0 % (ref 0.0–0.2)

## 2021-04-29 LAB — LIPASE, BLOOD: Lipase: 41 U/L (ref 11–51)

## 2021-04-29 MED ORDER — DOCUSATE SODIUM 100 MG PO CAPS: 100.0000 mg | ORAL_CAPSULE | Freq: Two times a day (BID) | ORAL | 0 refills | Status: DC | PRN

## 2021-04-29 MED ORDER — DICYCLOMINE HCL 10 MG PO CAPS
10.0000 mg | ORAL_CAPSULE | Freq: Once | ORAL | Status: AC
  Administered 2021-04-29: 10 mg via ORAL
  Filled 2021-04-29: qty 1

## 2021-04-29 MED ORDER — POLYETHYLENE GLYCOL 3350 17 GM/SCOOP PO POWD: ORAL | 0 refills | Status: DC

## 2021-04-29 NOTE — ED Provider Notes (Signed)
[facility]-EMERGENCY DEPT Provider Note   CSN: [id number] Arrival date & time: 04/29/21  0110     History Chief Complaint  Patient presents with   Abdominal Pain    Bruce [name] is a 45 y.o. male presenting for evaluation of L sided abd pain.   Patient states his pain began approximately 2 hours ago.  It initially was in his left upper quadrant, and then radiated to his left lower quadrant.  Pain was severe.  He has not taken anything for it including, ibuprofen.  However in the past 2 hours, pain has resolved.  He denies fevers, chills, nausea, vomiting.  He has associated urinary frequency, but no dysuria or hematuria.  He reports increased issues with constipation recently, but he is still passing gas.  No previous history of kidney stones or diverticulitis.  Additional history obtained per chart review.  Patient with a history of anxiety, bipolar, depression, hypertension, schizophrenia.   HPI     Past Medical History:  Diagnosis Date   Anxiety disorder    At risk for obstructive sleep apnea 04/15/2018   Bipolar 1 disorder (HCC)    Depression    Hepatitis B immune 04/15/2018   Hypertension    Schizophrenia (HCC)     Patient Active Problem List   Diagnosis Date Noted   Unexplained night sweats 10/13/2019   Chronic headaches 10/13/2019   Dizziness 10/13/2019   Earlobe lesion, left 07/15/2019   HLD (hyperlipidemia) 03/12/2019   Abscess of apex of dental root complicating chronic inflammation 03/12/2019   Finger pain, left 01/20/2019   HTN (hypertension) 11/12/2018   Tobacco use disorder 05/20/2018   Schizophrenia (HCC) 04/15/2018   Bipolar disorder (HCC) 04/15/2018   Anxiety and depression 04/15/2018   Gastroesophageal reflux disease 04/15/2018    Past Surgical History:  Procedure Laterality Date   wisdom teth removal         Family History  Problem Relation Age of Onset   Diabetes Mother    Hypertension Mother    Prostate cancer  Father    Diabetes Sister    Migraines Sister     Social History   Tobacco Use   Smoking status: Every Day    Packs/day: 0.50    Types: Cigarettes   Smokeless tobacco: Never   Tobacco comments:    previous marijuana smoker  Vaping Use   Vaping Use: Former  Substance Use Topics   Alcohol use: Not Currently   Drug use: No    Home Medications Prior to Admission medications   Medication Sig Start Date End Date Taking? Authorizing Provider  docusate sodium (COLACE) 100 MG capsule Take 1 capsule (100 mg total) by mouth every 12 (twelve) hours as needed for mild constipation. 04/29/21  Yes Geoffrey Mankin, PA-C  polyethylene glycol powder (MIRALAX) 17 GM/SCOOP powder Use 1-2 capfuls in 8 ounces of liquid up to 3 times a day until you are having regular bowel movements. 04/29/21  Yes Drake Landing, PA-C  acetaminophen (TYLENOL) 500 MG tablet Take 500 mg by mouth every 6 (six) hours as needed (pain).    [provider]  atorvastatin (LIPITOR) 40 MG tablet Take 1 tablet (40 mg total) by mouth daily. 03/17/21   Verdene Lennert, MD  hydrOXYzine (ATARAX/VISTARIL) 25 MG tablet Take 25 mg by mouth 2 (two) times daily as needed. 02/16/21   [provider]  lamoTRIgine (LAMICTAL) 100 MG tablet Take 100 mg by mouth daily. 02/16/21   [provider]  lisinopril (ZESTRIL)  20 MG tablet TAKE 1 TABLET (20 MG TOTAL) BY MOUTH DAILY. 03/03/21 03/03/22  Verdene Lennert, MD  mirtazapine (REMERON) 15 MG tablet Take 15 mg by mouth at bedtime. 02/16/21   [provider]  nicotine polacrilex (NICORETTE) 4 MG gum Take 1 each (4 mg total) by mouth as needed for smoking cessation. 11/12/18   Lanelle Bal, MD  pantoprazole (PROTONIX) 40 MG tablet Take 1 tablet (40 mg total) by mouth daily. 03/17/21 04/16/21  Verdene Lennert, MD  prazosin (MINIPRESS) 1 MG capsule Take 1 mg by mouth at bedtime. 02/16/21   [provider]  sertraline (ZOLOFT) 100 MG tablet Take 150 mg by  mouth daily. 02/16/21   [provider]    Allergies    Patient has no known allergies.  Review of Systems   Review of Systems  Gastrointestinal:  Positive for abdominal pain.  Genitourinary:  Positive for flank pain and frequency.  All other systems reviewed and are negative.  Physical Exam Updated Vital Signs BP (!) 158/112   Pulse 72   Temp 98.1 F (36.7 C) (Oral)   Resp 18   Ht 6\' 5"  (1.956 m)   Wt 120.7 kg   SpO2 99%   BMI 31.54 kg/m   Physical Exam Vitals and nursing note reviewed.  Constitutional:      General: He is not in acute distress.    Appearance: Normal appearance.     Comments: nontoxic  HENT:     Head: Normocephalic and atraumatic.  Eyes:     Conjunctiva/sclera: Conjunctivae normal.     Pupils: Pupils are equal, round, and reactive to light.  Cardiovascular:     Rate and Rhythm: Normal rate and regular rhythm.     Pulses: Normal pulses.  Pulmonary:     Effort: Pulmonary effort is normal. No respiratory distress.     Breath sounds: Normal breath sounds. No wheezing.     Comments: Speaking in full sentences.  Clear lung sounds in all fields. Abdominal:     General: There is no distension.     Palpations: Abdomen is soft.     Tenderness: There is abdominal tenderness in the left upper quadrant and left lower quadrant. There is left CVA tenderness.     Comments: Chest palpation of left upper quadrant, left lower quadrant, and left CVA on my exam.  No tenderness palpation of the right side.  No rigidity, guarding or distention.  Negative rebound.  No peritonitis  Musculoskeletal:        General: Normal range of motion.     Cervical back: Normal range of motion and neck supple.  Skin:    General: Skin is warm and dry.     Capillary Refill: Capillary refill takes less than 2 seconds.  Neurological:     Mental Status: He is alert and oriented to person, place, and time.  Psychiatric:        Mood and Affect: Mood and affect normal.         Speech: Speech normal.        Behavior: Behavior normal.    ED Results / Procedures / Treatments   Labs (all labs ordered are listed, but only abnormal results are displayed) Labs Reviewed  URINALYSIS, ROUTINE W REFLEX MICROSCOPIC - Abnormal; Notable for the following components:      Result Value   Color, Urine YELLOW (*)    APPearance CLEAR (*)    Bacteria, UA RARE (*)    All other components within  normal limits  CBC - Abnormal; Notable for the following components:   WBC 12.4 (*)    All other components within normal limits  COMPREHENSIVE METABOLIC PANEL - Abnormal; Notable for the following components:   Glucose, Bld 112 (*)    Creatinine, Ser 1.64 (*)    GFR, Estimated 53 (*)    All other components within normal limits  LIPASE, BLOOD    EKG None  Radiology CT Renal Stone Study  Result Date: 04/29/2021 CLINICAL DATA:  45 year old male with flank and left lower quadrant pain acute onset tonight. EXAM: CT ABDOMEN AND PELVIS WITHOUT CONTRAST TECHNIQUE: Multidetector CT imaging of the abdomen and pelvis was performed following the standard protocol without IV contrast. COMPARISON:  CT Abdomen and Pelvis 12/13/2020 and earlier. FINDINGS: Lower chest: Lower lung volumes with increased lung base atelectasis compared to April. No cardiomegaly, pericardial effusion or pleural effusion. Hepatobiliary: Partially contracted gallbladder. Negative noncontrast liver. Pancreas: Negative. Spleen: Negative. Adrenals/Urinary Tract: Normal adrenal glands. Noncontrast kidneys appear symmetric and normal. No nephrolithiasis. No hydronephrosis or pararenal inflammation. Both ureters are decompressed and appear negative. Diminutive, unremarkable bladder. Stomach/Bowel: Mild retained stool throughout the large bowel. Normal appendix on series 2, image 57 and no large bowel inflammation identified. Decompressed terminal ileum. No dilated small bowel. Noncontrast stomach and duodenum appear negative. No  free air, free fluid, mesenteric inflammation. Vascular/Lymphatic: Normal caliber abdominal aorta. Mild calcified iliac artery atherosclerosis. No lymphadenopathy. Reproductive: Negative. Other: No pelvic free fluid. Musculoskeletal: Advanced chronic disc and endplate degeneration at the lumbosacral junction. No acute osseous abnormality identified. IMPRESSION: Negative noncontrast CT Abdomen and Pelvis. No urinary calculus or obstructive uropathy. Normal appendix. Electronically Signed   By: Odessa Fleming M.D.   On: 04/29/2021 05:40    Procedures Procedures   Medications Ordered in ED Medications  dicyclomine (BENTYL) capsule 10 mg (10 mg Oral Given 04/29/21 0422)    ED Course  I have reviewed the triage vital signs and the nursing notes.  Pertinent labs & imaging results that were available during my care of the patient were reviewed by me and considered in my medical decision making (see chart for details).    MDM Rules/Calculators/A&P                           Patient presenting for evaluation of abdominal pain.  On exam, patient appears nontoxic.  He reports no pain, however abdominal exam does have some left-sided abdominal pain.  Consider constipation versus kidney stone versus diverticulitis versus UTI.  Labs obtained from triage interpreted by me, overall reassuring.  Urine without clear without infection and no blood.  CT renal ordered to ensure no intra-abdominal abnormalities.  CT renal negative for acute findings.  Does show stool burden consistent with constipation.  Discussed findings with patient.  Discussed treatment for this including medications, hydration, and high-fiber diet.  At this time, patient appears safe for discharge.  Return precautions given.  Patient states he understands and agrees to plan.  Final Clinical Impression(s) / ED Diagnoses Final diagnoses:  Constipation, unspecified constipation type    Rx / DC Orders ED Discharge Orders          Ordered     docusate sodium (COLACE) 100 MG capsule  Every 12 hours PRN        04/29/21 0550    polyethylene glycol powder (MIRALAX) 17 GM/SCOOP powder        04/29/21 0550  Alveria ApleyCaccavale, Faaris Arizpe, PA-C 04/29/21 [id number]    Palumbo, April, MD 04/29/21 [id number]

## 2021-04-29 NOTE — ED Triage Notes (Signed)
Pt came in with c/o LLQ pain that started one hour ago. Denies N/V/D.

## 2021-04-29 NOTE — Discharge Instructions (Addendum)
Make sure you are staying well-hydrated water. Eat a high-fiber diet to help with constipation. Use a stool softener to help with your stool soft. Use MiraLAX to help make you go. Follow-up with your primary care doctor as needed for further evaluation. Return to the emergency room if you develop fever, persistent vomiting, severe worsening pain, if you are unable to pass stool or gas, or with any new, worsening, or concerning symptoms

## 2021-04-29 NOTE — ED Notes (Signed)
MSE needed 

## 2021-05-01 ENCOUNTER — Telehealth: Payer: Self-pay | Admitting: *Deleted

## 2021-05-01 NOTE — Telephone Encounter (Signed)
Agree, thank you

## 2021-05-01 NOTE — Telephone Encounter (Signed)
Call pt's friend, Smith Mince - stated pt went to the ED on Saturday and was prescribed medications but they were not sent to the pharmacy.  According to chart, he was prescribed miralax and colace. Pt has an appt tomorrow. She wanted to know the names of the meds which I told her. And wanted to know if the doctor tomorrow could prescribe these meds if they are covered by his insurance. Informed her to ask the doctor tomorrow.

## 2021-05-02 ENCOUNTER — Encounter: Payer: Medicaid Other | Admitting: Internal Medicine

## 2021-05-02 ENCOUNTER — Telehealth: Payer: Self-pay | Admitting: *Deleted

## 2021-05-02 NOTE — Progress Notes (Deleted)
   CC: stomach pain  HPI:[name] is a 45 y.o. male who presents for evaluation of ***. Please see individual problem based A/P for details.  Recently 04/29/21 seen in ED for stomach pain LUQ radiated to LLQ related to constipation which resolved. Previous ED visits for stabbing pain with emesis thought to be related to anxiet and a visit for central burning pain, improved with pepcid.  CT abd 4/26 possible duodenitis. Stone study 9/10 negative Was referred to GI in past but missed due to incarceration. Ddx anxiety, GERD, IBS, PUD, gastritis, esophagitis, duodenitis, pancreatitis, cholecystitis, mesenteric ischemia, splenic infarct, hernia,  - on protonix, tylenol for pain - Refer to GI  Flank pain and increased urinary frequency - cystitis noted on CT 4/26, stone study 9/10 negative.   Chronic kidney disease Cr 1.6, baseline 1.2-1.3. GFR 53 - CT 4/26 negative for stone or hydronephrosis  Leukocytosis  HTN  HLD  Bipolar disorder  Anxiety depression  Depression, PHQ-9: Based on the patients  Flowsheet Row Office Visit from 10/13/2019 in [facility]  PHQ-9 Total Score 0      score we have ***.  Past Medical History:  Diagnosis Date   Anxiety disorder    At risk for obstructive sleep apnea 04/15/2018   Bipolar 1 disorder (HCC)    Depression    Hepatitis B immune 04/15/2018   Hypertension    Schizophrenia (HCC)    Review of Systems:   ROS   Physical Exam: There were no vitals filed for this visit.   General: *** HEENT: Conjunctiva nl , antiicteric sclerae, moist mucous membranes, no exudate or erythema Cardiovascular: Normal rate, regular rhythm.  No murmurs, rubs, or gallops Pulmonary : Equal breath sounds, No wheezes, rales, or rhonchi Abdominal: soft, nontender,  bowel sounds present Ext: No edema in lower extremities, no tenderness to palpation of lower extremities.   Assessment & Plan:   See Encounters Tab for problem based  charting.  Patient {GC/GE:3044014::"discussed with","seen with"} Dr. {[id number]::"Y. Hoffman","Guilloud","Mullen","Narendra","Raines","Vincent","Williams"}

## 2021-05-02 NOTE — Telephone Encounter (Signed)
Called patient regarding missed appointment/ unable to leave message.

## 2021-05-03 ENCOUNTER — Ambulatory Visit (INDEPENDENT_AMBULATORY_CARE_PROVIDER_SITE_OTHER): Payer: Medicaid Other | Admitting: Internal Medicine

## 2021-05-03 ENCOUNTER — Other Ambulatory Visit: Payer: Self-pay

## 2021-05-03 VITALS — BP 129/78 | HR 68 | Temp 98.0°F | Ht 77.0 in | Wt 257.8 lb

## 2021-05-03 DIAGNOSIS — R109 Unspecified abdominal pain: Secondary | ICD-10-CM

## 2021-05-03 DIAGNOSIS — G8929 Other chronic pain: Secondary | ICD-10-CM | POA: Diagnosis not present

## 2021-05-03 DIAGNOSIS — R1013 Epigastric pain: Secondary | ICD-10-CM

## 2021-05-03 LAB — POCT GLYCOSYLATED HEMOGLOBIN (HGB A1C): Hemoglobin A1C: 6 % — AB (ref 4.0–5.6)

## 2021-05-03 LAB — GLUCOSE, CAPILLARY: Glucose-Capillary: 98 mg/dL (ref 70–99)

## 2021-05-03 MED ORDER — PANTOPRAZOLE SODIUM 40 MG PO TBEC: 40.0000 mg | DELAYED_RELEASE_TABLET | Freq: Two times a day (BID) | ORAL | 5 refills | Status: DC

## 2021-05-03 MED ORDER — LACTULOSE 10 GM/15ML PO SOLN: ORAL | 0 refills | Status: DC

## 2021-05-03 NOTE — Progress Notes (Deleted)
   CC: stomach pain  HPI:[name] is a 45 y.o. male who presents for evaluation of ***. Please see individual problem based A/P for details.  Recently 04/29/21 seen in ED for stomach pain LUQ radiated to LLQ related to constipation which resolved. Previous ED visits for stabbing pain with emesis thought to be related to anxiet and a visit for central burning pain, improved with pepcid.  CT abd 4/26 possible duodenitis. Stone study 9/10 negative Was referred to GI in past but missed due to incarceration. Ddx anxiety, GERD, IBS, PUD, gastritis, esophagitis, duodenitis, pancreatitis, cholecystitis, mesenteric ischemia, splenic infarct, hernia,  - on protonix, tylenol for pain - Refer to GI - which med won't insurance pay for?  Flank pain and increased urinary frequency - cystitis noted on CT 4/26, stone study 9/10 negative.   Chronic kidney disease Cr 1.6, baseline 1.2-1.3. GFR 53 - CT 4/26 negative for stone or hydronephrosis  Leukocytosis  HTN  HLD  Bipolar disorder  Anxiety depression  Depression, PHQ-9: Based on the patients  Flowsheet Row Office Visit from 10/13/2019 in [facility]  PHQ-9 Total Score 0      score we have ***.  Past Medical History:  Diagnosis Date   Anxiety disorder    At risk for obstructive sleep apnea 04/15/2018   Bipolar 1 disorder (HCC)    Depression    Hepatitis B immune 04/15/2018   Hypertension    Schizophrenia (HCC)    Review of Systems:   ROS   Physical Exam: There were no vitals filed for this visit.   General: *** HEENT: Conjunctiva nl , antiicteric sclerae, moist mucous membranes, no exudate or erythema Cardiovascular: Normal rate, regular rhythm.  No murmurs, rubs, or gallops Pulmonary : Equal breath sounds, No wheezes, rales, or rhonchi Abdominal: soft, nontender,  bowel sounds present Ext: No edema in lower extremities, no tenderness to palpation of lower extremities.   Assessment & Plan:    See Encounters Tab for problem based charting.  Patient {GC/GE:3044014::"discussed with","seen with"} Dr. {[id number]::"A. Hoffman","Guilloud","Mullen","Narendra","Raines","Vincent","Williams"}

## 2021-05-03 NOTE — Patient Instructions (Signed)
[name], it was a pleasure seeing you today!  Today we discussed:  Abdominal pain and constipation -Referral to GI placed -Gastric emptying study ordered to evaluate for gastroparesis -Increase protonix to twice daily -Start lactulose for constipation. Start with 13mL. If you do not have a bowel movement within 4 hours, take another 47mL.    Start the following medications: Meds ordered this encounter  Medications   lactulose (CHRONULAC) 10 GM/15ML solution    Sig: Start with taking 56mL daily and titrate up to achieve one bowel movement daily    Dispense:  236 mL    Refill:  0   pantoprazole (PROTONIX) 40 MG tablet    Sig: Take 1 tablet (40 mg total) by mouth 2 (two) times daily.    Dispense:  30 tablet    Refill:  5      Please make sure to arrive 15 minutes prior to your next appointment. If you arrive late, you may be asked to reschedule.   We look forward to seeing you next time. Please call our clinic at [phone] if you have any questions or concerns. The best time to call is Monday-Friday from 9am-4pm, but there is someone available 24/7. If after hours or the weekend, call the main hospital number and ask for the Internal Medicine Resident On-Call. If you need medication refills, please notify your pharmacy one week in advance and they will send [place] a request.  Thank you for letting us take part in your care. Wishing you the best!

## 2021-05-04 ENCOUNTER — Encounter: Payer: Self-pay | Admitting: Internal Medicine

## 2021-05-04 DIAGNOSIS — K589 Irritable bowel syndrome without diarrhea: Secondary | ICD-10-CM | POA: Insufficient documentation

## 2021-05-04 DIAGNOSIS — G8929 Other chronic pain: Secondary | ICD-10-CM | POA: Insufficient documentation

## 2021-05-04 LAB — TSH: TSH: 0.498 u[IU]/mL (ref 0.450–4.500)

## 2021-05-04 NOTE — Progress Notes (Signed)
Office Visit   Patient ID: Bruce [name], male    DOB: 06/23/1976, 45 y.o.   MRN: [id number]   PCP: Bruce Lennert, MD   Subjective:  Bruce [name] is a 45 y.o. year old male who presents for evaluation of abdominal pain. Please refer to problem based charting for assessment and plan.  Objective:   BP 129/78 (BP Location: Right Arm, Patient Position: Sitting, Cuff Size: Normal)   Pulse 68   Temp 98 F (36.7 C) (Oral)   Ht 6\' 5"  (1.956 m)   Wt 257 lb 12.8 oz (116.9 kg)   SpO2 98%   BMI 30.57 kg/m  BP Readings from Last 3 Encounters:  05/03/21 129/78  04/29/21 (!) 158/112  03/16/21 134/87   General: well appearing, no distress Abd: soft, non-distended, no organomegaly. Bs active. Tenderness to palpation in the epigastric region.  Assessment & Plan:   Problem List Items Addressed This Visit       Other   Chronic abdominal pain    He presents with his fiance to the office today for an initial clinic evaluation of abdominal pain, in the context of 5 ED visits for this problem in 2022 alone. Initial ED visit for abdominal pain seems to date back to 2020. He has undergone 4 CTs of the abdomen since 2020 which have essentially only been remarkable for constipation.  HPI: He reports slowly worsening abdominal pain over the past 2 years that is pretty constant in nature. He has struggled with abdominal pain since childhood. He says that someone referred him to GI in the past however he says that he no showed several of those appointments but is ready to follow up with them at this time. The abdominal pain is diffuse but worse in the epigastric region. Pain is characterized as a squeezing sensation. Associated symptoms include early satiety, nausea, vomiting and constipation. Exacerbating factors include eating. No alleviating factors. He reports that he was prescribed Miralax at the last ED visit however can not afford it over the counter as it is not covered by medicaid. Exam:  epigastric tenderness on palpation. Remainder of exam is unremarkable. He does seem very distressed about the chronicity of pain in relation to being able to hold down a job Assessment Primary differential diagnosis is functional abdominal pain or cyclical abdominal pain in the setting of chronic marijuana use. Alternative ddx include gastroparesis vs dysthymia vs constipation vs GERD vs malignancy The previous workup, including labs and multiple unrevealing CTs is reassuring. He did not have a history of diabetes but given my suspicion for gastroparesis, I checked at A1C which is just mildly elevated at 6.0, so I doubt that he would have developed gastroparesis from this. With that being said, I think his symptom history is suspicious enough for gastroparesis to obtain a gastric emptying study. He is on hydroxazine which he takes for anxiety which may worsen both this constipation and gastroparesis.  His TSH is normal. One thing that has been consistent on his scans is heavy stool burden. He endorses difficulty with bowel movements which has been a long standing issue for him. Unfortunately, financial constraints are preventing them from using miralax or other first line constipation agents. I think that it would be worth trying some lactulose to see if we can get his bowel movements more regular and improve his abdominal pain from that standpoint. Malignancy suspicion is fairly low given the ongoing isolation of symptoms over the past two years.  I do think he  would benefit from an EGD given the persistent pain that is not responsive to PPI therapy. Plan Referral to GI Gastric emptying study Increase protonix to BID dosing for a while Start lactulose for constipation--instructions given to titrate based on bowel movements. Follow up in November with Dr. Huel Cote      Other Visit Diagnoses     Epigastric pain    -  Primary   Relevant Orders   Ambulatory referral to Gastroenterology   NM  Gastric Emptying   POC Hbg A1C (Completed)   TSH (Completed)       Return in about 7 weeks (around 06/21/2021) for follow up with Dr. Huel Cote for chronic medical conditions.   Pt discussed with Dr. Fredrich Romans, MD Internal Medicine Resident PGY-3 [facility] 05/04/2021 6:41 PM

## 2021-05-04 NOTE — Assessment & Plan Note (Addendum)
He presents with his fiance to the office today for an initial clinic evaluation of abdominal pain, in the context of 5 ED visits for this problem in 2022 alone. Initial ED visit for abdominal pain seems to date back to 2020. He has undergone 4 CTs of the abdomen since 2020 which have essentially only been remarkable for constipation.  HPI: He reports slowly worsening abdominal pain over the past 2 years that is pretty constant in nature. He has struggled with abdominal pain since childhood. He says that someone referred him to GI in the past however he says that he no showed several of those appointments but is ready to follow up with them at this time. The abdominal pain is diffuse but worse in the epigastric region. Pain is characterized as a squeezing sensation. Associated symptoms include early satiety, nausea, vomiting and constipation. Exacerbating factors include eating. No alleviating factors. He reports that he was prescribed Miralax at the last ED visit however can not afford it over the counter as it is not covered by medicaid. Exam: epigastric tenderness on palpation. Remainder of exam is unremarkable. He does seem very distressed about the chronicity of pain in relation to being able to hold down a job Assessment Primary differential diagnosis is functional abdominal pain or cyclical abdominal pain in the setting of chronic marijuana use. Alternative ddx include gastroparesis vs dysthymia vs constipation vs GERD vs malignancy I personally reviewed the findings of his previous workup, including labs and multiple unrevealing CTs, all of which is reassuring. He did not have a history of diabetes but given my suspicion for gastroparesis, I checked at A1C which is just mildly elevated at 6.0, so I doubt that he would have developed gastroparesis from this. With that being said, I think his symptom history is suspicious enough for gastroparesis to obtain a gastric emptying study. He is on hydroxazine  for anxiety which may worsen both this constipation and gastroparesis.  His TSH is normal. One thing that has been consistent on his scans is heavy stool burden. He endorses difficulty with bowel movements which has been a long standing issue for him. Unfortunately, financial constraints are preventing them from using miralax or other first line constipation agents. I think that it would be worth trying some lactulose to see if we can get his bowel movements more regular and improve his abdominal pain from that standpoint. Malignancy suspicion is fairly low given the ongoing isolation of symptoms over the past two years.  I do think he would benefit from an EGD given the persistent pain that is not responsive to PPI therapy. I also discussed marijuana cessation. He reports that his abdominal pain worsens with discontinuation. I discussed that he needs to consider the potential of cyclical vomiting syndrome causing his symptoms which would need to be treated with cessation. He is resistant to this right now. Plan  Referral to GI  Gastric emptying study  Increase protonix to BID dosing for a while  Start lactulose for constipation--instructions given to titrate based on bowel movements.  Encourage marijuana cessation  Follow up in November with Dr. Huel Cote

## 2021-05-05 ENCOUNTER — Telehealth: Payer: Self-pay

## 2021-05-05 NOTE — Telephone Encounter (Signed)
Requesting lab results. Also requesting the doctor to write a letter to CPS for his kids. Please call pt back.

## 2021-05-05 NOTE — Telephone Encounter (Signed)
Attempted to return call--no answer--VM left.

## 2021-05-08 NOTE — Progress Notes (Signed)
Internal Medicine Clinic Attending  Case discussed with Dr. Christian  At the time of the visit.  We reviewed the resident's history and exam and pertinent patient test results.  I agree with the assessment, diagnosis, and plan of care documented in the resident's note.  

## 2021-05-18 ENCOUNTER — Other Ambulatory Visit: Payer: Self-pay

## 2021-05-18 ENCOUNTER — Ambulatory Visit (HOSPITAL_COMMUNITY)
Admission: RE | Admit: 2021-05-18 | Discharge: 2021-05-18 | Disposition: A | Discharge status: Home / Self Care | Payer: Medicaid Other | Source: Ambulatory Visit | Attending: Internal Medicine | Admitting: Internal Medicine

## 2021-05-18 DIAGNOSIS — R1013 Epigastric pain: Secondary | ICD-10-CM | POA: Diagnosis not present

## 2021-05-18 MED ORDER — TECHNETIUM TC 99M SULFUR COLLOID
2.0000 | Freq: Once | INTRAVENOUS | Status: AC | PRN
  Administered 2021-05-18: 2 via INTRAVENOUS

## 2021-06-05 ENCOUNTER — Emergency Department (HOSPITAL_COMMUNITY)
Admission: EM | Admit: 2021-06-05 | Discharge: 2021-06-05 | Disposition: A | Discharge status: Home / Self Care | Payer: Medicaid Other | Attending: Physician Assistant | Admitting: Physician Assistant

## 2021-06-05 ENCOUNTER — Encounter (HOSPITAL_COMMUNITY): Payer: Self-pay | Admitting: Emergency Medicine

## 2021-06-05 DIAGNOSIS — Z5321 Procedure and treatment not carried out due to patient leaving prior to being seen by health care provider: Secondary | ICD-10-CM | POA: Diagnosis not present

## 2021-06-05 DIAGNOSIS — R112 Nausea with vomiting, unspecified: Secondary | ICD-10-CM | POA: Insufficient documentation

## 2021-06-05 DIAGNOSIS — R109 Unspecified abdominal pain: Secondary | ICD-10-CM | POA: Insufficient documentation

## 2021-06-05 LAB — LIPASE, BLOOD: Lipase: 28 U/L (ref 11–51)

## 2021-06-05 LAB — CBC WITH DIFFERENTIAL/PLATELET
Abs Immature Granulocytes: 0.08 10*3/uL — ABNORMAL HIGH (ref 0.00–0.07)
Basophils Absolute: 0 10*3/uL (ref 0.0–0.1)
Basophils Relative: 0 %
Eosinophils Absolute: 0 10*3/uL (ref 0.0–0.5)
Eosinophils Relative: 0 %
HCT: 46.2 % (ref 39.0–52.0)
Hemoglobin: 15.1 g/dL (ref 13.0–17.0)
Immature Granulocytes: 1 %
Lymphocytes Relative: 14 %
Lymphs Abs: 1.9 10*3/uL (ref 0.7–4.0)
MCH: 26.8 pg (ref 26.0–34.0)
MCHC: 32.7 g/dL (ref 30.0–36.0)
MCV: 81.9 fL (ref 80.0–100.0)
Monocytes Absolute: 0.6 10*3/uL (ref 0.1–1.0)
Monocytes Relative: 4 %
Neutro Abs: 11.6 10*3/uL — ABNORMAL HIGH (ref 1.7–7.7)
Neutrophils Relative %: 81 %
Platelets: 267 10*3/uL (ref 150–400)
RBC: 5.64 MIL/uL (ref 4.22–5.81)
RDW: 14.1 % (ref 11.5–15.5)
WBC: 14.2 10*3/uL — ABNORMAL HIGH (ref 4.0–10.5)
nRBC: 0 % (ref 0.0–0.2)

## 2021-06-05 LAB — COMPREHENSIVE METABOLIC PANEL
ALT: 18 U/L (ref 0–44)
AST: 23 U/L (ref 15–41)
Albumin: 3.4 g/dL — ABNORMAL LOW (ref 3.5–5.0)
Alkaline Phosphatase: 92 U/L (ref 38–126)
Anion gap: 7 (ref 5–15)
BUN: 8 mg/dL (ref 6–20)
CO2: 24 mmol/L (ref 22–32)
Calcium: 8.8 mg/dL — ABNORMAL LOW (ref 8.9–10.3)
Chloride: 108 mmol/L (ref 98–111)
Creatinine, Ser: 1.33 mg/dL — ABNORMAL HIGH (ref 0.61–1.24)
GFR, Estimated: >60 mL/min (ref >60)
Glucose, Bld: 150 mg/dL — ABNORMAL HIGH (ref 70–99)
Potassium: 4.5 mmol/L (ref 3.5–5.1)
Sodium: 139 mmol/L (ref 135–145)
Total Bilirubin: 1 mg/dL (ref 0.3–1.2)
Total Protein: 6.2 g/dL — ABNORMAL LOW (ref 6.5–8.1)

## 2021-06-05 MED ORDER — ONDANSETRON 4 MG PO TBDP
4.0000 mg | ORAL_TABLET | Freq: Once | ORAL | Status: AC
  Administered 2021-06-05: 4 mg via ORAL
  Filled 2021-06-05: qty 1

## 2021-06-05 MED ORDER — OXYCODONE-ACETAMINOPHEN 5-325 MG PO TABS
1.0000 | ORAL_TABLET | Freq: Once | ORAL | Status: AC
  Administered 2021-06-05: 1 via ORAL
  Filled 2021-06-05: qty 1

## 2021-06-05 NOTE — ED Provider Notes (Signed)
Emergency Medicine Provider Triage Evaluation Note  Bruce [name] , a 45 y.o. male  was evaluated in triage.  Pt complains of abd pain and constipation. Also c/o nv.  Review of Systems  Positive: Abd pain, nv Negative: fever  Physical Exam  There were no vitals taken for this visit. Gen:   Awake, no distress   Resp:  Normal effort  MSK:   Moves extremities without difficulty  Other:  Abd is completely soft and there is no focal tenderness. Pt is laying on the floor yelling and rolling around on a blanket   Medical Decision Making  Medically screening exam initiated at 7:22 PM.  Appropriate orders placed.  Bruce [name] was informed that the remainder of the evaluation will be completed by another provider, this initial triage assessment does not replace that evaluation, and the importance of remaining in the ED until their evaluation is complete.     Bruce [name] 06/05/21 1922    [name], MD 06/06/21 1009

## 2021-06-05 NOTE — ED Notes (Signed)
Pt called multiple times no answer 

## 2021-06-09 ENCOUNTER — Ambulatory Visit (INDEPENDENT_AMBULATORY_CARE_PROVIDER_SITE_OTHER): Payer: Medicaid Other | Admitting: Gastroenterology

## 2021-06-09 ENCOUNTER — Encounter: Payer: Self-pay | Admitting: Gastroenterology

## 2021-06-09 VITALS — BP 118/70 | HR 68 | Ht 75.0 in | Wt 256.2 lb

## 2021-06-09 DIAGNOSIS — K581 Irritable bowel syndrome with constipation: Secondary | ICD-10-CM | POA: Diagnosis not present

## 2021-06-09 MED ORDER — SENNA 8.6 MG PO TABS: 1.0000 | ORAL_TABLET | Freq: Every day | ORAL | 3 refills | Status: AC | PRN

## 2021-06-09 MED ORDER — DICYCLOMINE HCL 20 MG PO TABS: 20.0000 mg | ORAL_TABLET | Freq: Four times a day (QID) | ORAL | 3 refills | Status: DC

## 2021-06-09 MED ORDER — POLYETHYLENE GLYCOL 3350 17 GM/SCOOP PO POWD: 1.0000 | Freq: Every day | ORAL | 5 refills | Status: DC

## 2021-06-09 NOTE — Patient Instructions (Addendum)
If you are age 44 or older, your body mass index should be between 23-30. Your Body mass index is 32.03 kg/m. If this is out of the aforementioned range listed, please consider follow up with your Primary Care Provider.  If you are age 50 or younger, your body mass index should be between 19-25. Your Body mass index is 32.03 kg/m. If this is out of the aformentioned range listed, please consider follow up with your Primary Care Provider.   We have sent the following medications to your pharmacy for you to pick up at your convenience:Miralax daily, senna 8.6 mg as needed, Bentyl 20 mg as need.  The [facility] providers would like to encourage you to use [facility] to communicate with providers for non-urgent requests or questions.  Due to long hold times on the telephone, sending your provider a message by [facility] may be a faster and more efficient way to get a response.  Please allow 48 business hours for a response.  Please remember that this is for non-urgent requests.   It was a pleasure to see you today!  Thank you for trusting me with your gastrointestinal care!    Scott E.Tomasa Rand, MD

## 2021-06-09 NOTE — Progress Notes (Signed)
HPI : Bruce [name] is a very pleasant 45 year old male with a history of anxiety, bipolar disorder, depression and schizophrenia who was referred to [place] by Dr. Verdene Lennert for further evaluation of chronic abdominal pain and constipation.  Patient states he has been having problems with abdominal pain for about the last 2 years now.  This pain comes and goes.  He goes through periods where he has no problems with pain, then will go through periods where he has severe pain that interferes with his daily activities.  His pain varies in location, sometimes in the lower abdomen sometimes in the upper abdomen.  It is variably described as a twisted, knotted sensation in his abdomen.  It typically last for hours before resolves.  It often requires pain medications for the pain to go away.  Sometimes this is narcotics, sometimes Tylenol is enough.  When he has the pain, he also typically have symptoms of nausea and occasional vomiting.  He frequently feels lightheaded and dizzy.  He does note that his pain tends to be more of a problem during periods of excessive stress or intense emotions (anger, sadness). During this same time, he has also had changes in his bowel habits characterized by infrequent stools.  Currently has bowel movement only about twice a week.  Typically his stools are very small and hard and difficult to pass.  He has significant straining with bowel movements.  He denies seeing any blood in the stools.  Diarrhea is not a problem for him.  He states he used to have a bowel movement every single day without straining.  He has tried lactulose, which does produce bowel movements but it causes significant nausea and sometimes vomiting.  He also says he has taken MiraLAX in the past which has worked well for him. He takes hydroxyzine twice a day for anxiety.  He denies any regular narcotic usage. He is going through a period of significant stress related to a custody battle over his children and  problems maintaining a job.  He has been to the emergency department many times for the symptoms, and has had multiple normal CT scans and an ultrasound.  Blood tests have also been unremarkable.   Past Medical History:  Diagnosis Date   Anxiety disorder    At risk for obstructive sleep apnea 04/15/2018   Bipolar 1 disorder (HCC)    Depression    Hepatitis B immune 04/15/2018   HLD (hyperlipidemia)    Hypertension    Schizophrenia (HCC)      Past Surgical History:  Procedure Laterality Date   wisdom tooth removal     Family History  Problem Relation Age of Onset   Diabetes Mother    Hypertension Mother    Glaucoma Mother    Prostate cancer Father    Glaucoma Father    Diabetes Sister    Migraines Sister    Anemia Sister    Diabetes Maternal Grandmother    Heart Problems Daughter    Social History   Tobacco Use   Smoking status: Former    Packs/day: 0.50    Types: Cigarettes    Quit date: 05/09/2021    Years since quitting: 0.0   Smokeless tobacco: Never   Tobacco comments:    previous marijuana smoker  Vaping Use   Vaping Use: Never used  Substance Use Topics   Alcohol use: Not Currently   Drug use: Yes    Types: Marijuana   Current Outpatient Medications  Medication  Sig Dispense Refill   acetaminophen (TYLENOL) 500 MG tablet Take 500 mg by mouth every 6 (six) hours as needed (pain).     atorvastatin (LIPITOR) 40 MG tablet Take 1 tablet (40 mg total) by mouth daily. 90 tablet 1   hydrOXYzine (ATARAX/VISTARIL) 25 MG tablet Take 25 mg by mouth 2 (two) times daily as needed.     lactulose (CHRONULAC) 10 GM/15ML solution Start with taking 90mL daily and titrate up to achieve one bowel movement daily 236 mL 0   lamoTRIgine (LAMICTAL) 100 MG tablet Take 100 mg by mouth daily.     lisinopril (ZESTRIL) 20 MG tablet TAKE 1 TABLET (20 MG TOTAL) BY MOUTH DAILY. 90 tablet 3   mirtazapine (REMERON) 15 MG tablet Take 15 mg by mouth at bedtime.     pantoprazole  (PROTONIX) 40 MG tablet Take 1 tablet (40 mg total) by mouth 2 (two) times daily. 60 tablet 5   prazosin (MINIPRESS) 1 MG capsule Take 1 mg by mouth at bedtime.     sertraline (ZOLOFT) 100 MG tablet Take 150 mg by mouth daily.     No current facility-administered medications for this visit.   No Known Allergies   Review of Systems: All systems reviewed and negative except where noted in HPI.    NM Gastric Emptying  Result Date: 05/19/2021 CLINICAL DATA:  Suspected gastroparesis, abdominal pain and nausea for 1 year EXAM: NUCLEAR MEDICINE GASTRIC EMPTYING SCAN TECHNIQUE: After oral ingestion of radiolabeled meal, sequential abdominal images were obtained for 120 minutes. Residual percentage of activity remaining within the stomach was calculated at 60 and 120 minutes. RADIOPHARMACEUTICALS:  2 mCi Tc-6m sulfur colloid in standardized meal COMPARISON:  None. FINDINGS: Expected location of the stomach in the left upper quadrant. Ingested meal empties the stomach gradually over the course of the study with 22.9% retention at 60 min and 2.7% retention at 120 min (normal retention less than 30% at a 120 min). IMPRESSION: Normal gastric emptying study. Electronically Signed   By: Alcide Clever M.D.   On: 05/19/2021 21:04    Physical Exam: BP 118/70 (BP Location: Left Arm, Patient Position: Sitting, Cuff Size: Normal)   Pulse 68   Ht 6\' 3"  (1.905 m)   Wt 256 lb 4 oz (116.2 kg)   BMI 32.03 kg/m  Constitutional: Pleasant,well-developed, African-American male in no acute distress.  Patient repeatedly references his stressors related to his male companion and difficulty with child custody HEENT: Normocephalic and atraumatic. Conjunctivae are normal. No scleral icterus.  Mallampati 2, some missing teeth Cardiovascular: Normal rate, regular rhythm.  Pulmonary/chest: Effort normal and breath sounds normal. No wheezing, rales or rhonchi. Abdominal: Soft, nondistended, nontender. Bowel sounds active  throughout. There are no masses palpable. No hepatomegaly. Extremities: no edema Neurological: Alert and oriented to person place and time. Skin: Skin is warm and dry. No rashes noted. Psychiatric: Normal mood and affect. Behavior is normal.  CBC    Component Value Date/Time   WBC 14.2 (H) 06/05/2021 1958   RBC 5.64 06/05/2021 1958   HGB 15.1 06/05/2021 1958   HGB 16.7 04/15/2018 1617   HCT 46.2 06/05/2021 1958   HCT 49.6 04/15/2018 1617   PLT 267 06/05/2021 1958   PLT 262 04/15/2018 1617   MCV 81.9 06/05/2021 1958   MCV 86 04/15/2018 1617   MCH 26.8 06/05/2021 1958   MCHC 32.7 06/05/2021 1958   RDW 14.1 06/05/2021 1958   RDW 14.4 04/15/2018 1617   LYMPHSABS 1.9 06/05/2021 1958  MONOABS 0.6 06/05/2021 1958   EOSABS 0.0 06/05/2021 1958   BASOSABS 0.0 06/05/2021 1958    CMP     Component Value Date/Time   NA 139 06/05/2021 1958   NA 139 03/11/2019 1622   K 4.5 06/05/2021 1958   CL 108 06/05/2021 1958   CO2 24 06/05/2021 1958   GLUCOSE 150 (H) 06/05/2021 1958   BUN 8 06/05/2021 1958   BUN 9 03/11/2019 1622   CREATININE 1.33 (H) 06/05/2021 1958   CALCIUM 8.8 (L) 06/05/2021 1958   PROT 6.2 (L) 06/05/2021 1958   PROT 8.0 04/15/2018 1617   ALBUMIN 3.4 (L) 06/05/2021 1958   ALBUMIN 4.6 04/15/2018 1617   AST 23 06/05/2021 1958   ALT 18 06/05/2021 1958   ALKPHOS 92 06/05/2021 1958   BILITOT 1.0 06/05/2021 1958   BILITOT 0.6 04/15/2018 1617   GFRNONAA >60 06/05/2021 1958   GFRAA >60 11/16/2019 1941   CLINICAL DATA:  Right upper quadrant abdominal pain   EXAM: ULTRASOUND ABDOMEN LIMITED RIGHT UPPER QUADRANT   COMPARISON:  CT abdomen/pelvis dated 06/02/2019   FINDINGS: Gallbladder:   No gallstones, gallbladder wall thickening, or pericholecystic fluid. Negative sonographic Murphy's sign.   Common bile duct:   Diameter: 6 mm   Liver:   No focal lesion identified. At the upper limits of normal for parenchymal echogenicity. Portal vein is patent on color  Doppler imaging with normal direction of blood flow towards the liver.   Other: None.   IMPRESSION: Negative right upper quadrant ultrasound.     Electronically Signed   By: Charline Bills M.D.   On: 11/16/2019 22:19  CLINICAL DATA:  Abdominal pain, primarily left-sided   EXAM: CT ABDOMEN AND PELVIS WITH CONTRAST   TECHNIQUE: Multidetector CT imaging of the abdomen and pelvis was performed using the standard protocol following bolus administration of intravenous contrast.   CONTRAST:  OMNIPAQUE IOHEXOL 300 MG/ML  SOLN   COMPARISON:  November 14, 2020   FINDINGS: Lower chest: There is posterior left base atelectasis. No lung base edema or consolidation.   Hepatobiliary: No focal liver lesions are appreciable. Liver measures 19.9 cm in length. Gallbladder wall is not appreciably thickened. There is no biliary duct dilatation.   Pancreas: There is no pancreatic mass or inflammatory focus.   Spleen: No splenic lesions are evident.   Adrenals/Urinary Tract: Adrenals bilaterally appear normal. No evident renal mass or hydronephrosis on either side. There is no appreciable renal or ureteral calculus on either side. The urinary bladder is midline. There is evidence of a degree of urinary bladder wall thickening.   Stomach/Bowel: Moderate stool noted in the colon. There is slight thickening of the wall of the third portion of the duodenum. No other bowel wall thickening is noted. No evident bowel obstruction. The terminal ileum appears normal. Appendix appears normal. No free air or portal venous air.   Vascular/Lymphatic: No evident abdominal aortic aneurysm. Mild calcification noted in common iliac arteries. Major venous structures appear patent. No evident adenopathy in the abdomen or pelvis.   Reproductive: Prostate and seminal vesicles normal in size and contour. Occasional prostatic calculi noted.   Other: Fat noted in each inguinal ring. No bowel  containing hernia. No abscess or ascites evident in the abdomen or pelvis.   Musculoskeletal: Degenerative change present at L5-S1. There is vacuum phenomenon at this level. There is spinal stenosis at L4-5 due to disc protrusion and bony hypertrophy. Degenerative change noted in each sacroiliac joint. No blastic or lytic  bone lesions are evident. No abdominal wall or intramuscular lesions apparent.   IMPRESSION: 1. Mild wall thickening in the third portion of the duodenum, potentially representing a degree of post bulbar duodenitis. No other bowel wall thickening. No bowel obstruction.   2.  No abscess in the abdomen or pelvis.  Appendix appears normal.   3. Wall thickening of the urinary bladder consistent with a degree of cystitis.   4. No renal or ureteral calculus. No hydronephrosis on either side.   5. Spinal stenosis at L4-5 due to bony hypertrophy and disc protrusion.   6.  Liver prominent without focal liver lesion evident.     Electronically Signed   By: Bretta Bang III M.D.   On: 12/13/2020 12:20    ASSESSMENT AND PLAN: 45 y/o m with nearly 2 years of intermittent abdominal pain with chronic constipation, with pain episodes exacerbated by stress and tenths emotional states.  Multiple cross-sectional imaging studies have been unremarkable, as have been laboratory evaluations.  He does not have any red flag symptoms.  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  We discussed management of IBS, to include use of medications to improve bowel habits, as needed pain medicine, centrally acting neuromodulators, role of empiric dietary modifications to include a low FODMAP diet gluten-free diet, as well as the role of cognitive therapies.  We discussed the goals of IBS management, namely to minimize the impact of GI symptoms on quality  of life.  I recommended we start by trying to improve his stool regularity.  MiraLAX has worked well in the past when he is taking it.  Therefore, I recommended he start taking this every single day.  If after 1 to 2 weeks, he has not noticed an improvement in his stool regularity, he should increase this to 2-3 times per day.  If he still not having good bowel movements, or if this regimen is limited by diarrhea, he should follow-up with me in clinic to discuss other options.  Also recommended he try taking senna every 3 days as needed for when he has not had a satisfactory bowel movement.  We will try Bentyl as needed for his episodes of abdominal pain.  IBS-C -Miralax daily -Senna PRN -Bentyl PRN -Follow-up 6 to 8 weeks  Stellarose Cerny E. Tomasa Rand, MD [facility]   Verdene Lennert, MD

## 2021-06-27 ENCOUNTER — Ambulatory Visit: Payer: Medicaid Other

## 2021-07-10 ENCOUNTER — Telehealth: Payer: Self-pay | Admitting: Gastroenterology

## 2021-07-10 NOTE — Telephone Encounter (Signed)
Patient called stating his prescription of Bentyl, 4 times a day, was supposed to have three refills, but when he went to the pharmacy to get a refill, he was told they didn't have that information.  He is out and would like a refill.  He uses [facility] and would like a call at [phone] if you are able to call in a refill for him.  Thank you.

## 2021-07-10 NOTE — Telephone Encounter (Signed)
Called [facility] and was told that the patient did not know the name of the medication that is why it was not refilled. Pharmacy stated it was now ready for pick up. Called patient to inform him that medication was ready for pick up and patient stated that this pharmacy has a history of loosing his medication and not refilling all of his medication. Patient states he may change to another pharmacy.

## 2021-07-25 ENCOUNTER — Ambulatory Visit (INDEPENDENT_AMBULATORY_CARE_PROVIDER_SITE_OTHER): Payer: Medicaid Other | Admitting: Gastroenterology

## 2021-07-25 ENCOUNTER — Encounter: Payer: Self-pay | Admitting: Gastroenterology

## 2021-07-25 VITALS — BP 122/64 | HR 75 | Ht 76.0 in | Wt 259.0 lb

## 2021-07-25 DIAGNOSIS — K581 Irritable bowel syndrome with constipation: Secondary | ICD-10-CM

## 2021-07-25 MED ORDER — DICYCLOMINE HCL 20 MG PO TABS: 20.0000 mg | ORAL_TABLET | Freq: Four times a day (QID) | ORAL | 3 refills | Status: DC

## 2021-07-25 NOTE — Progress Notes (Signed)
    History of Present Illness:  Bruce [name] presents for follow up after his initial visit in October when he was diagnosed with IBS and prescribed Bentyl and Miralax and Senna.  Today, the patient reports that his symptoms are doing much better.  The Miralax works well but if he takes it everyday he will develop loose stools, so he will skip a day or two during the week.  His abdominal pain is improved with the Bentyl, but he also notes that he was having night sweats which have resolved with use of the Bentyl.  He has been having a bowel movement usually 1-2x/day.  He has not needed to take the Senna recently.  His weight has gone up a little.  He denies any other new symptoms.    Current Medications, Allergies, Past Medical History, Past Surgical History, Family History and Social History were reviewed in [employer] record.  Studies:   No results found.   Physical Exam: General: Pleasant, well developed ,   African American male in no acute distress, accompanied by girlfriend Head: Normocephalic and atraumatic Eyes:  sclerae anicteric, conjunctiva pink  Abdomen: Soft, non distended, non-tender. No masses, no hepatomegaly. Normal bowel sounds Musculoskeletal: Symmetrical with no gross deformities  Extremities: No edema  Neurological: Alert oriented x 4, grossly nonfocal Psychological:  Alert and cooperative. 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  No dietary changes initiated.  Will renew Bentyl per patient request.  No changes in treatment and no further work up needed.   Follow up as needed.  Patient advised he will need a colonoscopy at age 37.  IBS-C - Continue Miralax daily, Senna PRN, Bentyl PRN  Bruce Ellithorpe E. Tomasa Rand, MD [facility]

## 2021-07-25 NOTE — Patient Instructions (Signed)
If you are age 43 or older, your body mass index should be between 23-30. Your Body mass index is 31.53 kg/m. If this is out of the aforementioned range listed, please consider follow up with your Primary Care Provider.  If you are age 67 or younger, your body mass index should be between 19-25. Your Body mass index is 31.53 kg/m. If this is out of the aformentioned range listed, please consider follow up with your Primary Care Provider.   We have sent the following medications to your pharmacy for you to pick up at your convenience: Bentyl 20 mg Every 6 hours as needed for pain.   The [facility] providers would like to encourage you to use [facility] to communicate with providers for non-urgent requests or questions.  Due to long hold times on the telephone, sending your provider a message by [facility] may be a faster and more efficient way to get a response.  Please allow 48 business hours for a response.  Please remember that this is for non-urgent requests.   It was a pleasure to see you today!  Thank you for trusting me with your gastrointestinal care!    Scott E.Tomasa Rand, MD

## 2021-07-26 ENCOUNTER — Encounter: Payer: Self-pay | Admitting: Gastroenterology

## 2021-09-22 ENCOUNTER — Other Ambulatory Visit: Payer: Self-pay | Admitting: Internal Medicine

## 2021-09-22 DIAGNOSIS — E785 Hyperlipidemia, unspecified: Secondary | ICD-10-CM

## 2021-10-03 ENCOUNTER — Other Ambulatory Visit: Payer: Self-pay

## 2021-10-03 ENCOUNTER — Emergency Department
Admission: EM | Admit: 2021-10-03 | Discharge: 2021-10-03 | Disposition: A | Discharge status: Home / Self Care | Payer: Medicaid Other | Attending: Emergency Medicine | Admitting: Emergency Medicine

## 2021-10-03 ENCOUNTER — Emergency Department: Payer: Medicaid Other

## 2021-10-03 DIAGNOSIS — Z79899 Other long term (current) drug therapy: Secondary | ICD-10-CM | POA: Insufficient documentation

## 2021-10-03 DIAGNOSIS — R61 Generalized hyperhidrosis: Secondary | ICD-10-CM | POA: Insufficient documentation

## 2021-10-03 DIAGNOSIS — R1084 Generalized abdominal pain: Secondary | ICD-10-CM | POA: Diagnosis present

## 2021-10-03 DIAGNOSIS — D72829 Elevated white blood cell count, unspecified: Secondary | ICD-10-CM | POA: Diagnosis not present

## 2021-10-03 DIAGNOSIS — K5730 Diverticulosis of large intestine without perforation or abscess without bleeding: Secondary | ICD-10-CM | POA: Insufficient documentation

## 2021-10-03 DIAGNOSIS — K5900 Constipation, unspecified: Secondary | ICD-10-CM

## 2021-10-03 DIAGNOSIS — R8289 Other abnormal findings on cytological and histological examination of urine: Secondary | ICD-10-CM | POA: Diagnosis not present

## 2021-10-03 DIAGNOSIS — I10 Essential (primary) hypertension: Secondary | ICD-10-CM | POA: Diagnosis not present

## 2021-10-03 LAB — HEPATIC FUNCTION PANEL
ALT: 28 U/L (ref 0–44)
AST: 30 U/L (ref 15–41)
Albumin: 3.9 g/dL (ref 3.5–5.0)
Alkaline Phosphatase: 99 U/L (ref 38–126)
Bilirubin, Direct: <0.1 mg/dL (ref 0.0–0.2)
Total Bilirubin: 0.6 mg/dL (ref 0.3–1.2)
Total Protein: 7.1 g/dL (ref 6.5–8.1)

## 2021-10-03 LAB — BASIC METABOLIC PANEL
Anion gap: 7 (ref 5–15)
BUN: 11 mg/dL (ref 6–20)
CO2: 28 mmol/L (ref 22–32)
Calcium: 9.4 mg/dL (ref 8.9–10.3)
Chloride: 104 mmol/L (ref 98–111)
Creatinine, Ser: 1.36 mg/dL — ABNORMAL HIGH (ref 0.61–1.24)
GFR, Estimated: >60 mL/min (ref >60)
Glucose, Bld: 155 mg/dL — ABNORMAL HIGH (ref 70–99)
Potassium: 4.2 mmol/L (ref 3.5–5.1)
Sodium: 139 mmol/L (ref 135–145)

## 2021-10-03 LAB — LIPASE, BLOOD: Lipase: 30 U/L (ref 11–51)

## 2021-10-03 LAB — CBC WITH DIFFERENTIAL/PLATELET
Abs Immature Granulocytes: 0.13 10*3/uL — ABNORMAL HIGH (ref 0.00–0.07)
Basophils Absolute: 0.1 10*3/uL (ref 0.0–0.1)
Basophils Relative: 0 %
Eosinophils Absolute: 0 10*3/uL (ref 0.0–0.5)
Eosinophils Relative: 0 %
HCT: 43.7 % (ref 39.0–52.0)
Hemoglobin: 14.4 g/dL (ref 13.0–17.0)
Immature Granulocytes: 1 %
Lymphocytes Relative: 14 %
Lymphs Abs: 2.3 10*3/uL (ref 0.7–4.0)
MCH: 26.4 pg (ref 26.0–34.0)
MCHC: 33 g/dL (ref 30.0–36.0)
MCV: 80.2 fL (ref 80.0–100.0)
Monocytes Absolute: 0.7 10*3/uL (ref 0.1–1.0)
Monocytes Relative: 4 %
Neutro Abs: 13.5 10*3/uL — ABNORMAL HIGH (ref 1.7–7.7)
Neutrophils Relative %: 81 %
Platelets: 286 10*3/uL (ref 150–400)
RBC: 5.45 MIL/uL (ref 4.22–5.81)
RDW: 13.7 % (ref 11.5–15.5)
WBC: 16.7 10*3/uL — ABNORMAL HIGH (ref 4.0–10.5)
nRBC: 0 % (ref 0.0–0.2)

## 2021-10-03 LAB — URINALYSIS, ROUTINE W REFLEX MICROSCOPIC
Bacteria, UA: NONE SEEN
Bilirubin Urine: NEGATIVE
Glucose, UA: NEGATIVE mg/dL
Hgb urine dipstick: NEGATIVE
Ketones, ur: NEGATIVE mg/dL
Nitrite: NEGATIVE
Protein, ur: 30 mg/dL — AB
Specific Gravity, Urine: 1.018 (ref 1.005–1.030)
pH: 9 — ABNORMAL HIGH (ref 5.0–8.0)

## 2021-10-03 LAB — URINE DRUG SCREEN, QUALITATIVE (ARMC ONLY)
Amphetamines, Ur Screen: NOT DETECTED
Barbiturates, Ur Screen: NOT DETECTED
Benzodiazepine, Ur Scrn: NOT DETECTED
Cannabinoid 50 Ng, Ur ~~LOC~~: POSITIVE — AB
Cocaine Metabolite,Ur ~~LOC~~: NOT DETECTED
MDMA (Ecstasy)Ur Screen: NOT DETECTED
Methadone Scn, Ur: NOT DETECTED
Opiate, Ur Screen: NOT DETECTED
Phencyclidine (PCP) Ur S: NOT DETECTED
Tricyclic, Ur Screen: NOT DETECTED

## 2021-10-03 LAB — TROPONIN I (HIGH SENSITIVITY): Troponin I (High Sensitivity): 7 ng/L (ref <18)

## 2021-10-03 MED ORDER — ONDANSETRON 4 MG PO TBDP
4.0000 mg | ORAL_TABLET | Freq: Once | ORAL | Status: AC
  Administered 2021-10-03: 4 mg via ORAL
  Filled 2021-10-03: qty 1

## 2021-10-03 MED ORDER — MORPHINE SULFATE (PF) 4 MG/ML IV SOLN
6.0000 mg | Freq: Once | INTRAVENOUS | Status: AC
  Administered 2021-10-03: 6 mg via INTRAVENOUS
  Filled 2021-10-03: qty 2

## 2021-10-03 MED ORDER — IOHEXOL 300 MG/ML  SOLN
100.0000 mL | Freq: Once | INTRAMUSCULAR | Status: AC | PRN
  Administered 2021-10-03: 100 mL via INTRAVENOUS
  Filled 2021-10-03: qty 100

## 2021-10-03 MED ORDER — HALOPERIDOL LACTATE 5 MG/ML IJ SOLN
2.0000 mg | Freq: Once | INTRAMUSCULAR | Status: AC
Start: 2021-10-03 — End: 2021-10-03
  Administered 2021-10-03: 2 mg via INTRAVENOUS
  Filled 2021-10-03: qty 1

## 2021-10-03 MED ORDER — LACTATED RINGERS IV BOLUS
1000.0000 mL | Freq: Once | INTRAVENOUS | Status: AC
  Administered 2021-10-03: 1000 mL via INTRAVENOUS

## 2021-10-03 MED ORDER — DICYCLOMINE HCL 20 MG PO TABS: 20.0000 mg | ORAL_TABLET | Freq: Four times a day (QID) | ORAL | 3 refills | Status: DC

## 2021-10-03 MED ORDER — ONDANSETRON 4 MG PO TBDP
4.0000 mg | ORAL_TABLET | Freq: Once | ORAL | Status: AC | PRN
  Administered 2021-10-03: 4 mg via ORAL
  Filled 2021-10-03: qty 1

## 2021-10-03 NOTE — ED Provider Triage Note (Signed)
Emergency Medicine Provider Triage Evaluation Note  Boubacar Shiner , a 46 y.o. male  was evaluated in triage.  Pt complains of presents from home vial EMS, with complaints of acute on chronic abdominal pain. He has a history bipolar d/o, anxiety. He reports he threw up "about 15 times" with onset 3 hours prior to arrival. Vital signs stable per EMS.   Review of Systems  Positive: NV, abd pain Negative: FCS  Physical Exam  There were no vitals taken for this visit. Gen:   Awake, no distress   Resp:  Normal effort  MSK:   Moves extremities without difficulty  Other:  ABD: soft, nontender  Medical Decision Making  Medically screening exam initiated at 1:00 PM.  Appropriate orders placed.  Dean Lubbers was informed that the remainder of the evaluation will be completed by another provider, this initial triage assessment does not replace that evaluation, and the importance of remaining in the ED until their evaluation is complete.  Patient presents to the ED via EMS with complaints of acute on chronic abdominal pain.   Melvenia Needles, PA-C 10/03/21 1310

## 2021-10-03 NOTE — ED Provider Notes (Signed)
[facility] Provider Note    Event Date/Time   First MD Initiated Contact with Patient 10/03/21 1417     (approximate)   History   Abdominal Pain   HPI  Bruce [name] is a 46 y.o. male  a history of anxiety, bipolar disorder, depression, schizophrenia and IBS with recurrent constipation previously seen by GI in October prescribed MiraLAX who presents, by fianc for assessment of some acute on chronic generalized abdominal pain associate with nausea and vomiting, chills and some substernal burning when he is vomiting.  Patient states he has been constipated but pooped a very hard stool today and yesterday.  He states he has not been taking MiraLAX because he does not feel this helps at all.  He denies any other new medications or antacids.  Denies any illicit drug use or EtOH use.  States he has not used any THC in some time.  No other sick symptoms as far as he can tell including fevers, earache, sore throat, cough, shortness of breath, back pain rash or extremity pain.      Physical Exam  Triage Vital Signs: ED Triage Vitals [10/03/21 1341]  Enc Vitals Group     BP (!) 142/79     Pulse Rate 61     Resp 18     Temp 97.7 F (36.5 C)     Temp Source Oral     SpO2 96 %     Weight 256 lb (116.1 kg)     Height 6\' 5"  (1.956 m)     Head Circumference      Peak Flow      Pain Score 8     Pain Loc      Pain Edu?      Excl. in GC?     Most recent vital signs: Vitals:   10/03/21 1341  BP: (!) 142/79  Pulse: 61  Resp: 18  Temp: 97.7 F (36.5 C)  SpO2: 96%    General: Awake appears very uncomfortable.  Diaphoretic and rolling back and forth in bed. CV:  Prolonged capillary refill.  2+ radial pulses.  No murmurs rubs or gallops. Resp:  Normal effort.  Clear bilaterally. Abd:  No distention.  Tender throughout.   ED Results / Procedures / Treatments  Labs (all labs ordered are listed, but only abnormal results are displayed) Labs Reviewed   URINALYSIS, ROUTINE W REFLEX MICROSCOPIC - Abnormal; Notable for the following components:      Result Value   Color, Urine YELLOW (*)    APPearance CLOUDY (*)    pH 9.0 (*)    Protein, ur 30 (*)    Leukocytes,Ua TRACE (*)    All other components within normal limits  URINE DRUG SCREEN, QUALITATIVE ([facility] ONLY) - Abnormal; Notable for the following components:   Cannabinoid 50 Ng, Ur [place] POSITIVE (*)    All other components within normal limits  BASIC METABOLIC PANEL - Abnormal; Notable for the following components:   Glucose, Bld 155 (*)    Creatinine, Ser 1.36 (*)    All other components within normal limits  CBC WITH DIFFERENTIAL/PLATELET - Abnormal; Notable for the following components:   WBC 16.7 (*)    Neutro Abs 13.5 (*)    Abs Immature Granulocytes 0.13 (*)    All other components within normal limits  LIPASE, BLOOD  HEPATIC FUNCTION PANEL  TROPONIN I (HIGH SENSITIVITY)     EKG  EKG is unremarkable sinus rhythm with first-degree AV  block with a period of up to 22, ventricular rate of 73, normal axis with otherwise unremarkable intervals and no clear evidence of acute ischemia or significant arrhythmia other than isolated nonspecific changes in lead III.   RADIOLOGY   PROCEDURES:     MEDICATIONS ORDERED IN ED: Medications  iohexol (OMNIPAQUE) 300 MG/ML solution 100 mL (has no administration in time range)  ondansetron (ZOFRAN-ODT) disintegrating tablet 4 mg (4 mg Oral Given 10/03/21 1346)  ondansetron (ZOFRAN-ODT) disintegrating tablet 4 mg (4 mg Oral Given 10/03/21 1456)  lactated ringers bolus 1,000 mL (1,000 mLs Intravenous New Bag/Given 10/03/21 1456)  morphine (PF) 4 MG/ML injection 6 mg (6 mg Intravenous Given 10/03/21 1455)     IMPRESSION / MDM / ASSESSMENT AND PLAN / ED COURSE  I reviewed the triage vital signs and the nursing notes.                              Differential diagnosis includes, but is not limited to infectious gastritis, atypical  presentation for ACS, acute infectious gastritis, symptomatic constipation, appendicitis, cholecystitis, kidney stone, cystitis and metabolic derangements.  EKG is unremarkable sinus rhythm with first-degree AV block with a period of up to 22, ventricular rate of 73, normal axis with otherwise unremarkable intervals and no clear evidence of acute ischemia or significant arrhythmia other than isolated nonspecific changes in lead III.  Nonelevated troponin is not suggestive of ACS at this time.  BMP shows stable kidney function without any other significant electrolyte or metabolic derangements.  CBC shows leukocytosis with WBC count of 16.7 without evidence of acute anemia and normal platelets.  Lipase not consistent with acute pancreatitis.  Hepatic function panel shows no evidence of acute hepatitis or cholestatic process.  UA has some protein and trace leukocyte esterase but no other evidence of infection and no blood.  UDS is positive for cannabinoids.  We will give some IV fluids and morphine pending CTA to assess for additional above etiologies of patient's symptoms.  Care of patient signed over to assuming care at approximately 3:20 PM.  Plan to follow-up CT abdomen pelvis and reassess.      FINAL CLINICAL IMPRESSION(S) / ED DIAGNOSES   Final diagnoses:  Generalized abdominal pain  Hypertension, unspecified type  Constipation, unspecified constipation type     Rx / DC Orders   ED Discharge Orders     None        Note:  This document was prepared using Dragon voice recognition software and may include unintentional dictation errors.   Gilles Chiquito, MD 10/03/21 [phone]

## 2021-10-03 NOTE — ED Provider Notes (Signed)
Patient signed out to me at 3 PM.  In brief this is a patient with history of chronic abdominal pain who presents with worsening pain today.  Labs notable for a mild leukocytosis of 16.  He is pending a CT of the abdomen at the time of signout.  CT abdomen is negative for acute intra-abdominal process.  Stable for discharge.   Georga Hacking, MD 10/03/21 [phone]

## 2021-10-03 NOTE — Discharge Instructions (Signed)
Your CAT scan did not show any acute abnormality.  You can take Tylenol and Motrin for the pain.  Follow-up with your primary care provider.

## 2021-10-03 NOTE — ED Triage Notes (Signed)
Pt to ED via [facility] with c/o abd pain above his navel, with N/V/D that began this am. Pt reports he was seen by PMD and GI they gave him some medication for the pain that they report is caused by stress and nerves but they are not aware of what the medication was. Pt is wrapped up in a blanket and moaning in triage.

## 2021-10-06 ENCOUNTER — Encounter: Payer: Self-pay | Admitting: Gastroenterology

## 2021-10-20 ENCOUNTER — Encounter: Payer: Self-pay | Admitting: Internal Medicine

## 2021-10-20 ENCOUNTER — Other Ambulatory Visit: Payer: Self-pay | Admitting: Internal Medicine

## 2021-10-20 ENCOUNTER — Ambulatory Visit: Payer: Medicaid Other | Admitting: Internal Medicine

## 2021-10-20 ENCOUNTER — Other Ambulatory Visit: Payer: Self-pay

## 2021-10-20 VITALS — BP 102/74 | HR 63 | Temp 98.3°F | Resp 24 | Ht 76.0 in | Wt 265.5 lb

## 2021-10-20 DIAGNOSIS — G8929 Other chronic pain: Secondary | ICD-10-CM

## 2021-10-20 DIAGNOSIS — K581 Irritable bowel syndrome with constipation: Secondary | ICD-10-CM | POA: Diagnosis present

## 2021-10-20 DIAGNOSIS — I10 Essential (primary) hypertension: Secondary | ICD-10-CM | POA: Diagnosis not present

## 2021-10-20 DIAGNOSIS — R519 Headache, unspecified: Secondary | ICD-10-CM

## 2021-10-20 DIAGNOSIS — M79604 Pain in right leg: Secondary | ICD-10-CM | POA: Insufficient documentation

## 2021-10-20 NOTE — Assessment & Plan Note (Signed)
[name] has begun following with [facility], with Dr. Cunningham.  He was diagnosed with IBS with constipation.  He was recommended to continue MiraLAX and senna for constipation and Bentyl as needed.  [name] states he been having difficulty with bowel movements lately and he cannot recall which medicines were recommended to him. ? ?Assessment/plan: ?- Counseled [name] to take 1 dose of MiraLAX and 1 tablet of senna together once a day.  If no bowel movement after 2-3 days, he can increase dosing to twice daily.  If still no bowel movement, he can increase 2 doses of MiraLAX and 2 tablets of senna ?- Patient does not require refill of Bentyl today ?- Continue following with GI ?

## 2021-10-20 NOTE — Assessment & Plan Note (Signed)
[name] states that he continues to have chronic headaches.  When I began to ask him to describe this, [name] became frustrated.  He states that all doctors always tell him that it is secondary to his nerves and they never offer him any resolution.  I discussed the vicious cycle between stress and headaches.  At this point, [name] terminated his visit early without a full evaluation. ?

## 2021-10-20 NOTE — Assessment & Plan Note (Signed)
BP: 102/74  ? ?Blood pressure today within goal, however patient endorsed dizziness that is chronic. We did not have time to discuss this further as patient left the appointment before we could explore this further.  ? ?- No refills sent in today. Please have patient make an appointment regarding BP should he require further refills.  ?

## 2021-10-20 NOTE — Assessment & Plan Note (Deleted)
Bruce Bruce [name] has begun following with Bruce Bruce [name], with Dr. Candis Bruce [name].  He was diagnosed with IBS with constipation.  He was recommended to continue MiraLAX and senna for constipation and Bentyl as needed.  Bruce Bruce [name] states he been having difficulty with bowel movements lately and he cannot recall which medicines were recommended to him. ? ?Assessment/plan: ?- Counseled Bruce Bruce [name] to take 1 dose of MiraLAX and 1 tablet of senna together once a day.  If no bowel movement after 2-3 days, he can increase dosing to twice daily.  If still no bowel movement, he can increase 2 doses of MiraLAX and 2 tablets of senna ?- Patient does not require refill of Bentyl today ?- Continue following with Bruce [name] ?

## 2021-10-20 NOTE — Progress Notes (Signed)
? ?  CC: HTN; IBS; chronic leg pain ? ?HPI: ? ?[name] is a 46 y.o. with a PMHx as listed below who presents to the clinic for HTN; IBS; chronic leg pain.  ? ?Please see the Encounters tab for problem-based Assessment & Plan regarding status of patient's acute and chronic conditions. ? ?Past Medical History:  ?Diagnosis Date  ? Anxiety disorder   ? At risk for obstructive sleep apnea 04/15/2018  ? Bipolar 1 disorder (HCC)   ? Depression   ? Hepatitis B immune 04/15/2018  ? HLD (hyperlipidemia)   ? Hypertension   ? Schizophrenia (HCC)   ? ?Review of Systems: Review of Systems  ?Gastrointestinal:  Positive for abdominal pain and constipation.  ?Musculoskeletal:  Positive for myalgias.  ?Neurological:  Positive for dizziness and headaches. Negative for focal weakness and weakness.  ?Psychiatric/Behavioral:  Positive for depression.   ? ?Physical Exam: ? ?Vitals:  ? 10/20/21 1007  ?BP: 102/74  ?Pulse: 63  ?Resp: (!) 24  ?Temp: 98.3 ?F (36.8 ?C)  ?TempSrc: Oral  ?SpO2: 97%  ?Weight: 265 lb 8 oz (120.4 kg)  ?Height: 6\' 4"  (1.93 m)  ? ?Patient left appointment disgruntled before I had an opportunity to perform a physical examination.  ? ?Assessment & Plan:  ? ?See Encounters Tab for problem based charting. ? ?Patient discussed with Dr. ? ?

## 2021-10-20 NOTE — Addendum Note (Signed)
Addended by: Verdene Lennert on: 10/20/2021 12:36 PM ? ? Modules accepted: Level of Service ? ?

## 2021-10-20 NOTE — Assessment & Plan Note (Signed)
Mr. Culotta states that he has a several month 2-year history of right leg pain that begins in the mid right buttock region and extends down below his knee.  The pain does not go into his feet at all.  The pain is described as a burning sensation.  At times, Mr. Bero experiences muscle tightness and occasionally muscle spasms. ? ?Assessment/plan: ?Mr. Wessels terminated his appointment early before I could perform physical examination.  His symptoms are most consistent with piriformis syndrome and I explained this to him prior to his departure.  I suggested stretches and potentially physical therapy.  Mr. Christell Constant left his appointment before I could confirm that he would like to try physical therapy, so we will need to discuss this further at his next appointment ?

## 2021-10-23 ENCOUNTER — Other Ambulatory Visit (HOSPITAL_COMMUNITY): Payer: Self-pay

## 2021-10-23 ENCOUNTER — Other Ambulatory Visit: Payer: Self-pay

## 2021-10-23 MED ORDER — ACETAMINOPHEN 500 MG PO TABS
500.0000 mg | ORAL_TABLET | Freq: Four times a day (QID) | ORAL | 0 refills | Status: DC | PRN
  Filled 2021-10-23 – 2022-01-03 (×2): qty 30, 8d supply, fill #0

## 2021-10-23 MED ORDER — DICYCLOMINE HCL 20 MG PO TABS: 20.0000 mg | ORAL_TABLET | Freq: Four times a day (QID) | ORAL | 3 refills | Status: DC

## 2021-10-23 NOTE — Telephone Encounter (Signed)
All medications except Tylenol are prescribed by [facility].  ?

## 2021-10-23 NOTE — Progress Notes (Signed)
Internal Medicine Clinic Attending  Case discussed with Dr. Basaraba  At the time of the visit.  We reviewed the resident's history and exam and pertinent patient test results.  I agree with the assessment, diagnosis, and plan of care documented in the resident's note.  

## 2021-11-07 ENCOUNTER — Other Ambulatory Visit: Payer: Self-pay | Admitting: Internal Medicine

## 2021-11-28 ENCOUNTER — Encounter: Payer: Medicaid Other | Admitting: Internal Medicine

## 2021-12-05 ENCOUNTER — Other Ambulatory Visit: Payer: Self-pay

## 2021-12-05 ENCOUNTER — Emergency Department
Admission: EM | Admit: 2021-12-05 | Discharge: 2021-12-05 | Disposition: A | Discharge status: Home / Self Care | Payer: Medicaid Other | Attending: Emergency Medicine | Admitting: Emergency Medicine

## 2021-12-05 ENCOUNTER — Emergency Department: Payer: Medicaid Other

## 2021-12-05 ENCOUNTER — Encounter: Payer: Medicaid Other | Admitting: Internal Medicine

## 2021-12-05 ENCOUNTER — Other Ambulatory Visit: Payer: Self-pay | Admitting: Internal Medicine

## 2021-12-05 DIAGNOSIS — R1013 Epigastric pain: Secondary | ICD-10-CM | POA: Diagnosis present

## 2021-12-05 DIAGNOSIS — R112 Nausea with vomiting, unspecified: Secondary | ICD-10-CM | POA: Diagnosis not present

## 2021-12-05 LAB — COMPREHENSIVE METABOLIC PANEL
ALT: 25 U/L (ref 0–44)
AST: 28 U/L (ref 15–41)
Albumin: 3.6 g/dL (ref 3.5–5.0)
Alkaline Phosphatase: 105 U/L (ref 38–126)
Anion gap: 10 (ref 5–15)
BUN: 14 mg/dL (ref 6–20)
CO2: 22 mmol/L (ref 22–32)
Calcium: 8.5 mg/dL — ABNORMAL LOW (ref 8.9–10.3)
Chloride: 105 mmol/L (ref 98–111)
Creatinine, Ser: 1.47 mg/dL — ABNORMAL HIGH (ref 0.61–1.24)
GFR, Estimated: >60 mL/min (ref >60)
Glucose, Bld: 141 mg/dL — ABNORMAL HIGH (ref 70–99)
Potassium: 4 mmol/L (ref 3.5–5.1)
Sodium: 137 mmol/L (ref 135–145)
Total Bilirubin: 0.6 mg/dL (ref 0.3–1.2)
Total Protein: 6.5 g/dL (ref 6.5–8.1)

## 2021-12-05 LAB — CBC
HCT: 44.7 % (ref 39.0–52.0)
Hemoglobin: 14.6 g/dL (ref 13.0–17.0)
MCH: 26.1 pg (ref 26.0–34.0)
MCHC: 32.7 g/dL (ref 30.0–36.0)
MCV: 80 fL (ref 80.0–100.0)
Platelets: 272 10*3/uL (ref 150–400)
RBC: 5.59 MIL/uL (ref 4.22–5.81)
RDW: 14.4 % (ref 11.5–15.5)
WBC: 15.9 10*3/uL — ABNORMAL HIGH (ref 4.0–10.5)
nRBC: 0 % (ref 0.0–0.2)

## 2021-12-05 LAB — LIPASE, BLOOD: Lipase: 35 U/L (ref 11–51)

## 2021-12-05 MED ORDER — DICYCLOMINE HCL 10 MG PO CAPS
10.0000 mg | ORAL_CAPSULE | Freq: Once | ORAL | Status: AC
  Administered 2021-12-05: 10 mg via ORAL

## 2021-12-05 MED ORDER — ONDANSETRON 4 MG PO TBDP: 4.0000 mg | ORAL_TABLET | Freq: Three times a day (TID) | ORAL | 0 refills | Status: AC | PRN

## 2021-12-05 MED ORDER — IOHEXOL 300 MG/ML  SOLN
100.0000 mL | Freq: Once | INTRAMUSCULAR | Status: AC | PRN
  Administered 2021-12-05: 100 mL via INTRAVENOUS
  Filled 2021-12-05: qty 100

## 2021-12-05 MED ORDER — PANTOPRAZOLE SODIUM 40 MG IV SOLR
40.0000 mg | Freq: Once | INTRAVENOUS | Status: AC
  Administered 2021-12-05: 40 mg via INTRAVENOUS
  Filled 2021-12-05: qty 10

## 2021-12-05 MED ORDER — DICYCLOMINE HCL 10 MG/ML IM SOLN
20.0000 mg | Freq: Once | INTRAMUSCULAR | Status: DC
  Filled 2021-12-05: qty 2

## 2021-12-05 MED ORDER — ONDANSETRON 4 MG PO TBDP: 4.0000 mg | ORAL_TABLET | Freq: Three times a day (TID) | ORAL | 0 refills | Status: DC | PRN

## 2021-12-05 MED ORDER — LACTATED RINGERS IV BOLUS
1000.0000 mL | Freq: Once | INTRAVENOUS | Status: AC
  Administered 2021-12-05: 1000 mL via INTRAVENOUS

## 2021-12-05 MED ORDER — DROPERIDOL 2.5 MG/ML IJ SOLN
2.5000 mg | Freq: Once | INTRAMUSCULAR | Status: AC
  Administered 2021-12-05: 2.5 mg via INTRAVENOUS
  Filled 2021-12-05: qty 2

## 2021-12-05 NOTE — ED Notes (Signed)
Pt brought in by EMS from home, pt has had generalized abd pain x 2 years states worsened yesterday.  ?

## 2021-12-05 NOTE — ED Provider Notes (Addendum)
? ?[facility] ?Provider Note ? ? ? Event Date/Time  ? First MD Initiated Contact with Patient 12/05/21 1210   ?  (approximate) ? ? ?History  ? ?Abdominal Pain ? ? ?HPI ? ?Bruce [name] is a 46 y.o. male  with a history of anxiety, bipolar disorder, depression, schizophrenia and IBS as well as some chronic constipation who presents for evaluation of some acute on chronic epigastric abdominal pain associate nausea and vomiting.  Patient states he has been trying to take MiraLAX but does not think is helping as he only had a very small hard bowel movement yesterday.  No lower abdominal pain, back pain, cough, shortness of breath, chest pain, headache, earache, sore throat but has been vomiting today.  No blood in his vomit.  He endorses some THC use but no illicit drug use otherwise or any EtOH use.  No NSAIDs or blood thinners.  He is not sure if he has ever had a gastric ulcer or gastritis before.  No other acute concerns at this time. ? ?  ? ? ?Physical Exam  ?Triage Vital Signs: ?ED Triage Vitals  ?Enc Vitals Group  ?   BP 12/05/21 1050 137/89  ?   Pulse Rate 12/05/21 1050 78  ?   Resp 12/05/21 1050 20  ?   Temp 12/05/21 1050 98.8 ?F (37.1 ?C)  ?   Temp Source 12/05/21 1050 Oral  ?   SpO2 12/05/21 1050 98 %  ?   Weight 12/05/21 1049 259 lb (117.5 kg)  ?   Height 12/05/21 1049 6\' 5"  (1.956 m)  ?   Head Circumference --   ?   Peak Flow --   ?   Pain Score 12/05/21 1048 10  ?   Pain Loc --   ?   Pain Edu? --   ?   Excl. in GC? --   ? ? ?Most recent vital signs: ?Vitals:  ? 12/05/21 1050  ?BP: 137/89  ?Pulse: 78  ?Resp: 20  ?Temp: 98.8 ?F (37.1 ?C)  ?SpO2: 98%  ? ? ?General: Awake, appears uncomfortable diaphoretic and dry heaving during my exam. ?CV:  Good peripheral perfusion.  2+ radial pulses. ?Resp:  Normal effort.  Clear bilaterally. ?Abd:  No distention.  In the epigastrium without any significant tenderness in the right upper quadrant and lower quadrants. ?Other:  No CVA  tenderness. ? ? ?ED Results / Procedures / Treatments  ?Labs ?(all labs ordered are listed, but only abnormal results are displayed) ?Labs Reviewed  ?CBC - Abnormal; Notable for the following components:  ?    Result Value  ? WBC 15.9 (*)   ? All other components within normal limits  ?COMPREHENSIVE METABOLIC PANEL - Abnormal; Notable for the following components:  ? Glucose, Bld 141 (*)   ? Creatinine, Ser 1.47 (*)   ? Calcium 8.5 (*)   ? All other components within normal limits  ?LIPASE, BLOOD  ?URINALYSIS, ROUTINE W REFLEX MICROSCOPIC  ? ? ? ?EKG ? ?ECG was sinus bradycardia with a rate of 56, no evidence of acute ischemia and otherwise unremarkable intervals and normal axis. ? ? ?RADIOLOGY ? ?CT abdomen pelvis on my interpretation without evidence of diverticulitis, SBO, abscess, appendicitis or kidney stone.  I also reviewed radiologist interpretation and agree to findings of enlarged fatty liver and diverticulosis without diverticulitis and some lumbar spondylosis without any other acute process. ? ? ?PROCEDURES: ? ?Critical Care performed: No ? ?Procedures ? ? ?MEDICATIONS ORDERED IN  ED: ?Medications  ?dicyclomine (BENTYL) capsule 10 mg (10 mg Oral Given 12/05/21 1059)  ?lactated ringers bolus 1,000 mL (1,000 mLs Intravenous New Bag/Given 12/05/21 1237)  ?pantoprazole (PROTONIX) injection 40 mg (40 mg Intravenous Given 12/05/21 1241)  ?droperidol (INAPSINE) 2.5 MG/ML injection 2.5 mg (2.5 mg Intravenous Given 12/05/21 1240)  ?iohexol (OMNIPAQUE) 300 MG/ML solution 100 mL (100 mLs Intravenous Contrast Given 12/05/21 1254)  ? ? ? ?IMPRESSION / MDM / ASSESSMENT AND PLAN / ED COURSE  ?I reviewed the triage vital signs and the nursing notes. ?             ?               ? ?Differential diagnosis includes, but is not limited to gastritis, peptic ulcer disease, SBO, constipation, diverticulitis, kidney stone, anginal equivalent, hepatitis and symptomatic cholelithiasis. ? ?ECG is not suggestive of atypical cardiac  ischemia. ? ? ?CT abdomen pelvis on my interpretation without evidence of diverticulitis, SBO, abscess, appendicitis or kidney stone.  I also reviewed radiologist interpretation and agree to findings of enlarged fatty liver and diverticulosis without diverticulitis and some lumbar spondylosis without any other acute process. ? ?CMP shows no significant electrolyte or metabolic derangements.  No evidence of hepatitis or cholestatic process.  Kidney function is at baseline.  Lipase not suggestive of pancreatitis.  CBC with some nonspecific leukocytosis with WBC count of 15.9.  It seems patient has chronic leukocytosis over the last year when it was last checked 2 months ago of 16.7, 6 months ago it was 14.2 and 7 months ago was 12.4 and 11 months ago it was 15.  No acute anemia and normal platelets today. ? ?Patient given some analgesia antacid and droperidol.  On reassessment he is feeling drastically much better.  Discussed that I recommended he avoid all THC use.  Also advised close outpatient PCP follow-up and use of his Protonix daily.  Also prescribe short course of Zofran.  At this time with a list patient for me life-threatening process I think he is stable for discharge with outpatient follow-up.  Discharged in stable condition.  Strict turn precautions advised and discussed.  Advised to take MiraLAX when he gets home up to 5 caps over 24 hours to help with his constipation. ?  ? ? ?FINAL CLINICAL IMPRESSION(S) / ED DIAGNOSES  ? ?Final diagnoses:  ?Epigastric pain  ?Nausea and vomiting, unspecified vomiting type  ? ? ? ?Rx / DC Orders  ? ?ED Discharge Orders   ? ?      Ordered  ?  ondansetron (ZOFRAN-ODT) 4 MG disintegrating tablet  Every 8 hours PRN,   Status:  Discontinued       ? 12/05/21 1405  ?  ondansetron (ZOFRAN-ODT) 4 MG disintegrating tablet  Every 8 hours PRN       ? 12/05/21 1406  ? ?  ?  ? ?  ? ? ? ?Note:  This document was prepared using Dragon voice recognition software and may include  unintentional dictation errors. ?  ?Gilles Chiquito, MD ?12/05/21 1408 ? ?  ?Gilles Chiquito, MD ?12/05/21 1409 ? ?

## 2021-12-05 NOTE — ED Notes (Signed)
Patient transported to CT 

## 2021-12-05 NOTE — ED Triage Notes (Signed)
Pt comes into the ED via EMS from home with c/o abd pain with N/V since 8am ? ?HR70 ?99%RA ?160/97 ? ?

## 2021-12-06 NOTE — Telephone Encounter (Signed)
Epigastric stomach pain suspected to be secondary to IBS-C per GI. Twice daily Protonix not recommended.  ?

## 2021-12-08 ENCOUNTER — Other Ambulatory Visit: Payer: Self-pay | Admitting: Internal Medicine

## 2021-12-19 ENCOUNTER — Encounter: Payer: Medicaid Other | Admitting: Internal Medicine

## 2021-12-20 ENCOUNTER — Encounter: Payer: Self-pay | Admitting: Internal Medicine

## 2021-12-25 ENCOUNTER — Other Ambulatory Visit: Payer: Self-pay

## 2021-12-25 ENCOUNTER — Telehealth: Payer: Self-pay | Admitting: Gastroenterology

## 2021-12-25 MED ORDER — PANTOPRAZOLE SODIUM 40 MG PO TBEC: 40.0000 mg | DELAYED_RELEASE_TABLET | Freq: Two times a day (BID) | ORAL | 3 refills | Status: DC

## 2021-12-25 NOTE — Telephone Encounter (Signed)
We received a call from patient requesting protonix refill. Patient has OV scheduled 01/02/22. Please advise. ?

## 2022-01-02 ENCOUNTER — Ambulatory Visit: Payer: Medicaid Other | Admitting: Gastroenterology

## 2022-01-03 ENCOUNTER — Other Ambulatory Visit: Payer: Self-pay | Admitting: Internal Medicine

## 2022-01-03 ENCOUNTER — Other Ambulatory Visit (HOSPITAL_COMMUNITY): Payer: Self-pay

## 2022-01-03 NOTE — Telephone Encounter (Signed)
These medications are are filled by patient's psychiatrist.  ?

## 2022-02-06 ENCOUNTER — Other Ambulatory Visit: Payer: Self-pay | Admitting: Internal Medicine

## 2022-02-06 DIAGNOSIS — I10 Essential (primary) hypertension: Secondary | ICD-10-CM

## 2022-03-01 ENCOUNTER — Other Ambulatory Visit: Payer: Self-pay | Admitting: Internal Medicine

## 2022-03-01 DIAGNOSIS — E785 Hyperlipidemia, unspecified: Secondary | ICD-10-CM

## 2022-03-12 ENCOUNTER — Other Ambulatory Visit: Payer: Self-pay | Admitting: Gastroenterology

## 2022-03-22 IMAGING — US US ABDOMEN LIMITED
1 series · 14 of 25 positions shown · non-contrast
Comparison: CT abdomen/pelvis dated 06/02/2019

CLINICAL DATA: Right upper quadrant abdominal pain

EXAM:
ULTRASOUND ABDOMEN LIMITED RIGHT UPPER QUADRANT

[Series 1: us abdomen limited · 14 of 61 slices shown]
[im 1/61]
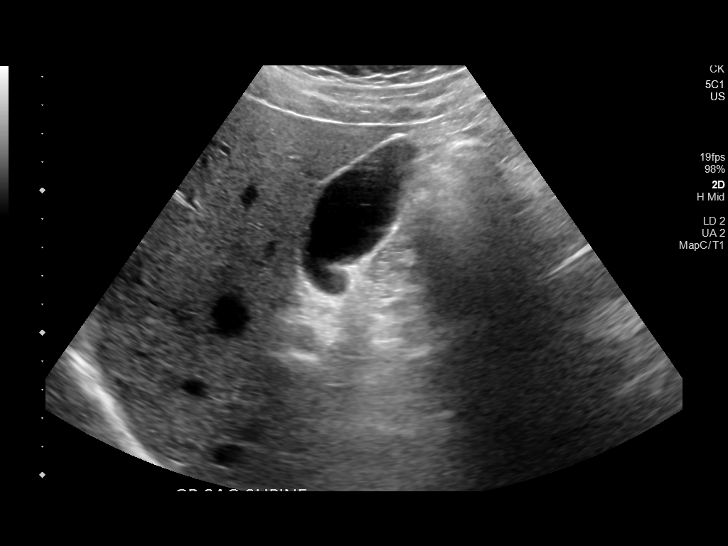
[im 6/61]
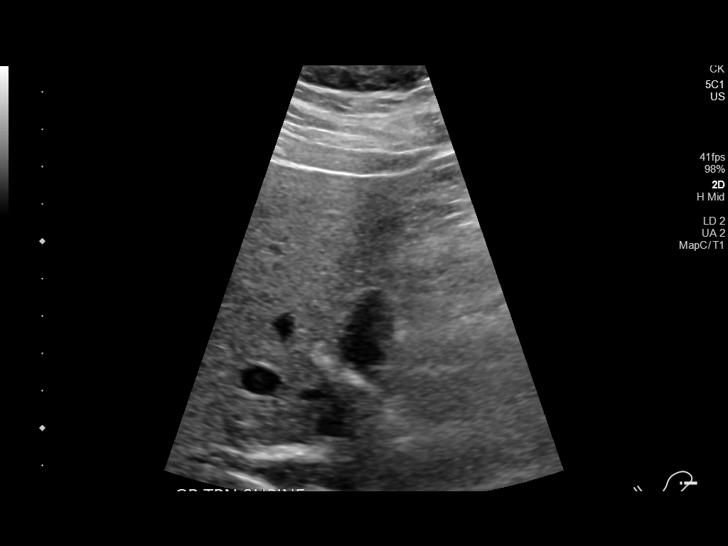
[im 11/61]
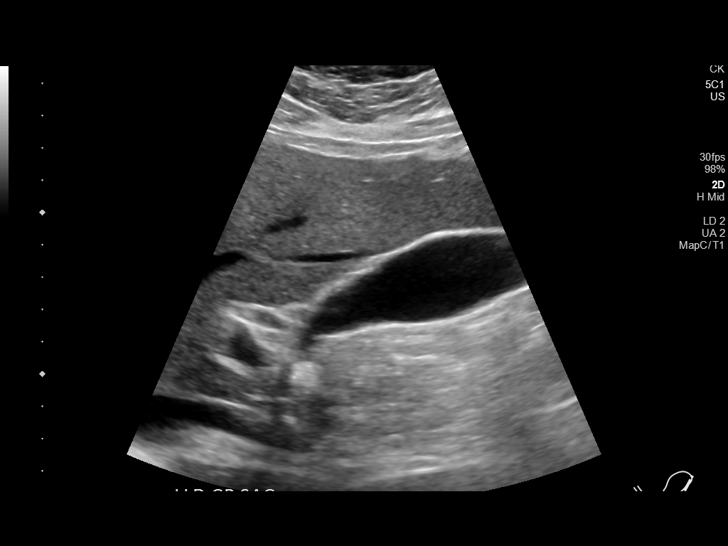
[im 16/61]
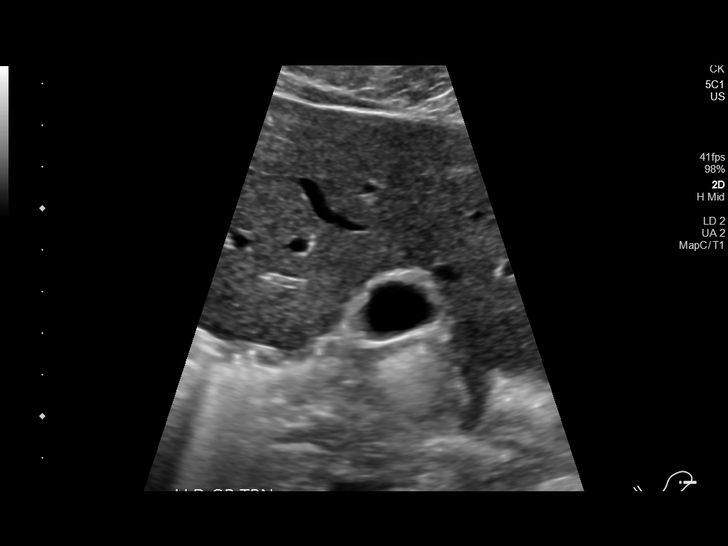
[im 21/61]
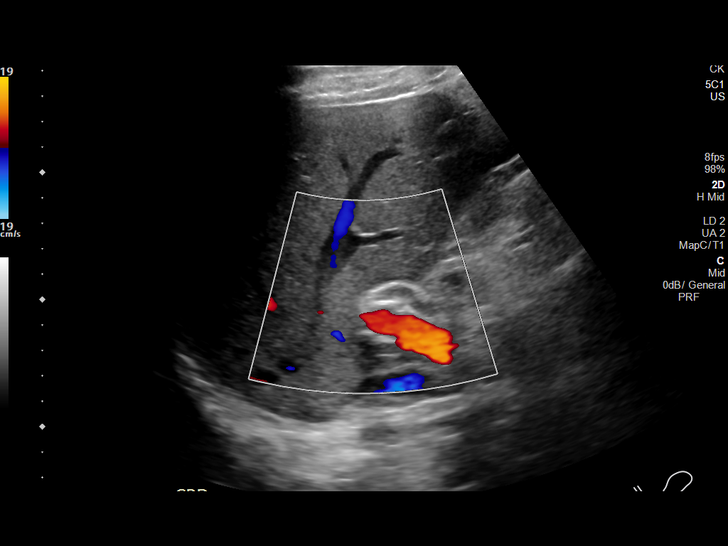
[im 23/61]
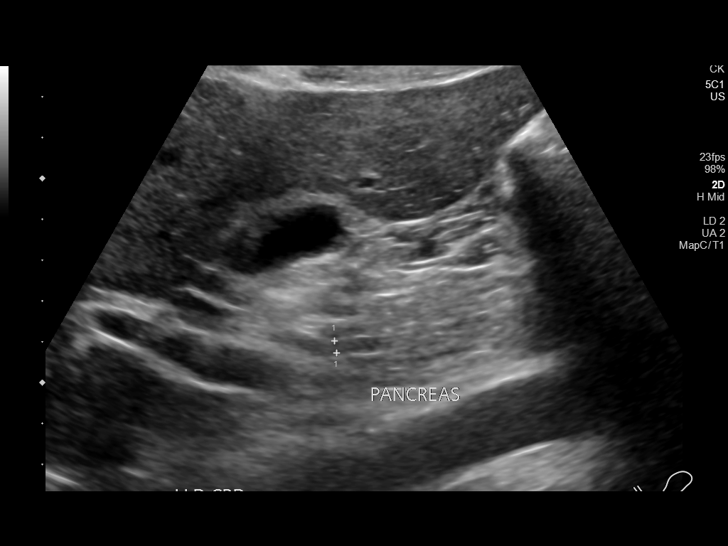
[im 28/61]
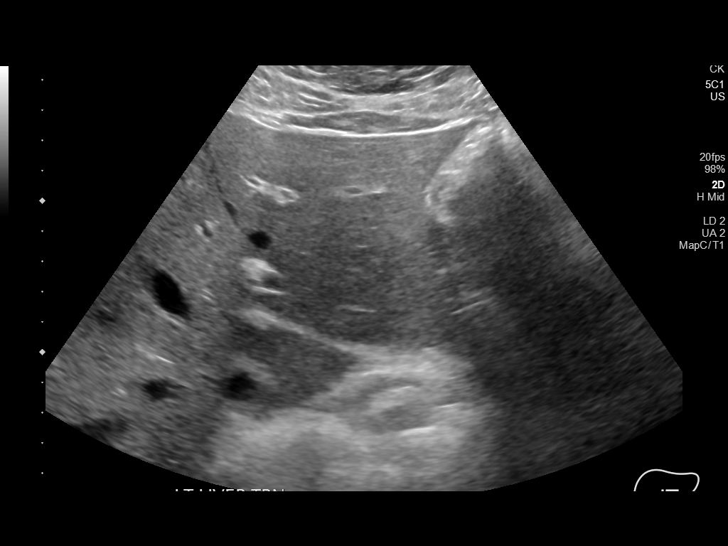
[im 33/61]
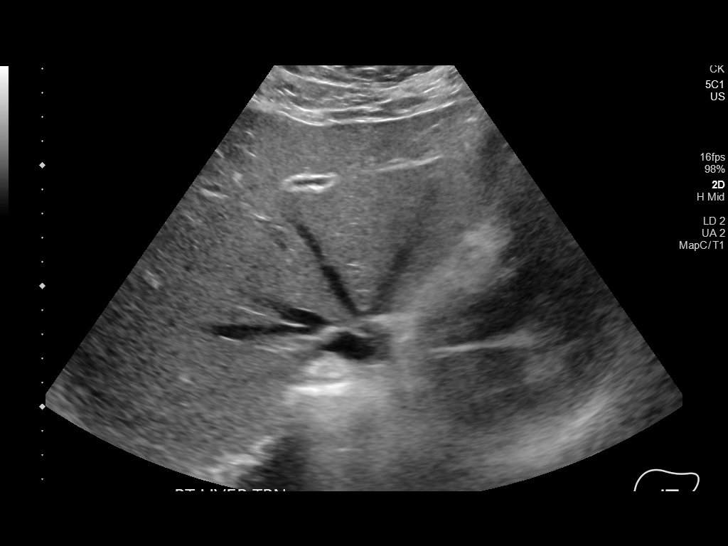
[im 38/61]
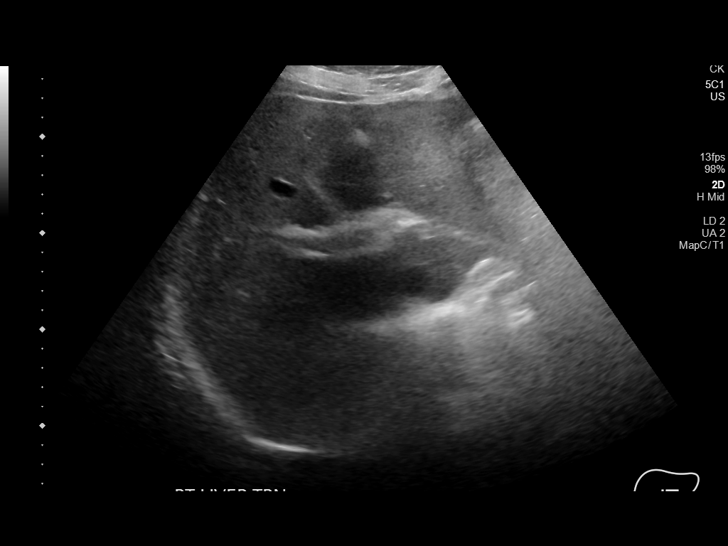
[im 41/61]
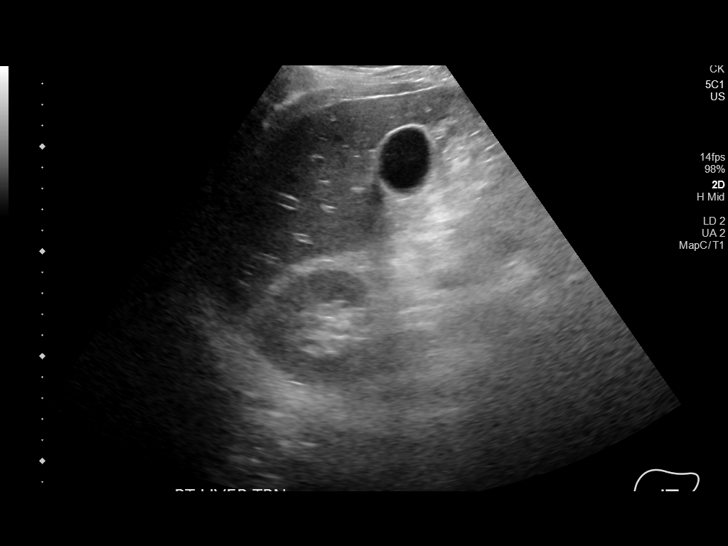
[im 46/61]
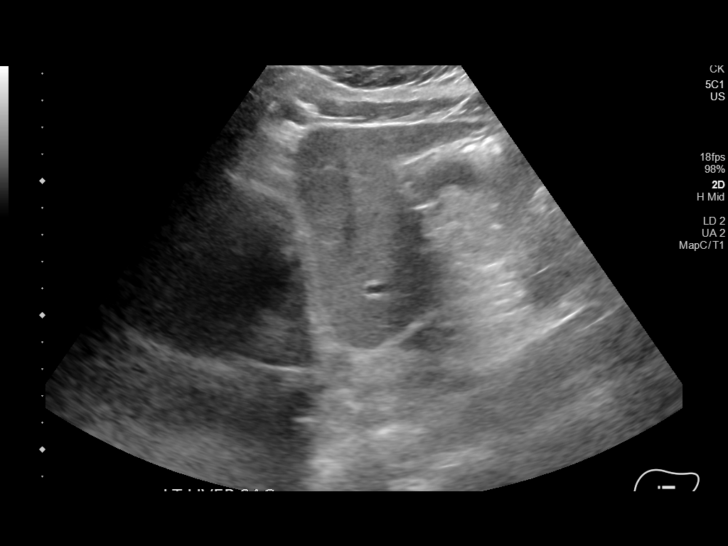
[im 51/61]
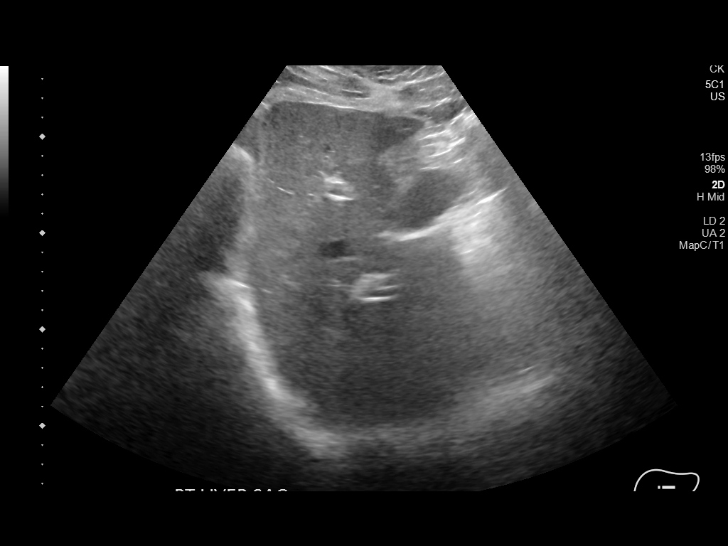
[im 56/61]
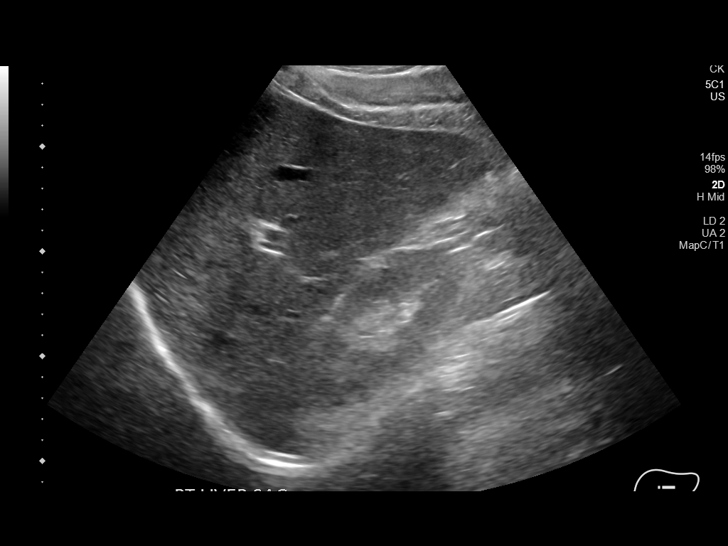
[im 61/61]
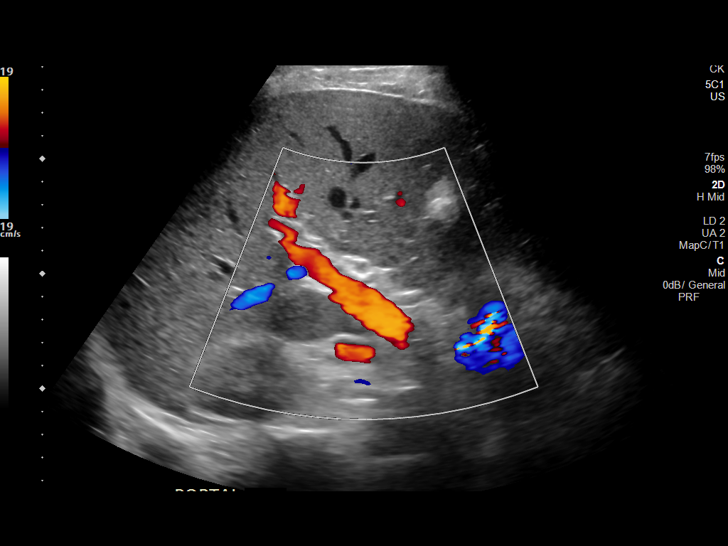

[14 of 25 positions shown; findings below may reference images not displayed]

FINDINGS: Gallbladder:

No gallstones, gallbladder wall thickening, or pericholecystic
fluid. Negative sonographic Murphy's sign.

Common bile duct:

Diameter: 6 mm

Liver:

No focal lesion identified. At the upper limits of normal for
parenchymal echogenicity. Portal vein is patent on color Doppler
imaging with normal direction of blood flow towards the liver.

Other: None.
IMPRESSION: Negative right upper quadrant ultrasound.

## 2022-07-17 ENCOUNTER — Other Ambulatory Visit: Payer: Self-pay | Admitting: Gastroenterology

## 2022-08-28 ENCOUNTER — Encounter: Payer: Medicaid Other | Admitting: Student

## 2022-08-30 ENCOUNTER — Ambulatory Visit (INDEPENDENT_AMBULATORY_CARE_PROVIDER_SITE_OTHER): Payer: Medicaid Other

## 2022-08-30 ENCOUNTER — Other Ambulatory Visit: Payer: Self-pay

## 2022-08-30 VITALS — BP 124/78 | HR 84 | Temp 98.6°F | Ht 77.0 in | Wt 274.0 lb

## 2022-08-30 DIAGNOSIS — J449 Chronic obstructive pulmonary disease, unspecified: Secondary | ICD-10-CM | POA: Insufficient documentation

## 2022-08-30 DIAGNOSIS — Z136 Encounter for screening for cardiovascular disorders: Secondary | ICD-10-CM

## 2022-08-30 DIAGNOSIS — E785 Hyperlipidemia, unspecified: Secondary | ICD-10-CM

## 2022-08-30 DIAGNOSIS — Z87891 Personal history of nicotine dependence: Secondary | ICD-10-CM | POA: Diagnosis not present

## 2022-08-30 DIAGNOSIS — Z9189 Other specified personal risk factors, not elsewhere classified: Secondary | ICD-10-CM

## 2022-08-30 DIAGNOSIS — I10 Essential (primary) hypertension: Secondary | ICD-10-CM | POA: Diagnosis not present

## 2022-08-30 DIAGNOSIS — R0602 Shortness of breath: Secondary | ICD-10-CM | POA: Insufficient documentation

## 2022-08-30 DIAGNOSIS — Z13220 Encounter for screening for lipoid disorders: Secondary | ICD-10-CM

## 2022-08-30 DIAGNOSIS — Z0000 Encounter for general adult medical examination without abnormal findings: Secondary | ICD-10-CM

## 2022-08-30 DIAGNOSIS — R051 Acute cough: Secondary | ICD-10-CM

## 2022-08-30 DIAGNOSIS — R059 Cough, unspecified: Secondary | ICD-10-CM | POA: Insufficient documentation

## 2022-08-30 DIAGNOSIS — Z131 Encounter for screening for diabetes mellitus: Secondary | ICD-10-CM

## 2022-08-30 NOTE — Patient Instructions (Signed)
[name], it was a pleasure seeing you today! You endorsed feeling well today. Below are some of the things we talked about this visit. We look forward to seeing you in the follow up appointment!  Today we discussed: Someone will call to schedule your lung scan and sleep study. I will call with your lab results.   If your breathing gets worse or you start coughing up more blood or feel dizzy/have episodes of passing out, please call our office.  I have ordered the following labs today:  Lab Orders         BMP8+Anion Gap         Lipid Profile         Hemoglobin A1c         QuantiFERON-TB Gold Plus       Referrals ordered today:   Referral Orders  No referral(s) requested today     I have ordered the following medication/changed the following medications:   Stop the following medications: There are no discontinued medications.   Start the following medications: No orders of the defined types were placed in this encounter.    Follow-up:  hemoptysis, CT/lab results, sleep study results    Please make sure to arrive 15 minutes prior to your next appointment. If you arrive late, you may be asked to reschedule.   We look forward to seeing you next time. Please call our clinic at [phone] if you have any questions or concerns. The best time to call is Monday-Friday from 9am-4pm, but there is someone available 24/7. If after hours or the weekend, call the main hospital number and ask for the Internal Medicine Resident On-Call. If you need medication refills, please notify your pharmacy one week in advance and they will send [place] a request.  Thank you for letting us take part in your care. Wishing you the best!  Thank you, Linus Galas MD

## 2022-08-30 NOTE — Assessment & Plan Note (Signed)
>>  ASSESSMENT AND PLAN FOR COUGH WRITTEN ON 08/31/2022  3:39 PM BY Linus Galas, MD  Had initial viral syndrome with fevers, cough, dyspnea more than a week ago. Cough has persisted and he describes it as naggy and worse at night/early morning. Also has sputum production with blood and clots mixed in with his mucus. Most of the time this happens at night/early morning. No nosebleeds.  Has history of incarceration and prior smoking history as well.   Respiratory exam is unremarkable. No oropharyngeal lesions or irritation of nasal mucosa. Airway very small, mallampati 4.  Given risk factors, will rule out TB and lung malignancy, can also consider laryngoscopy in the future if initial workup is unremarkable and he has persistent symptoms.  - QuantiFERON gold and CT chest -CBC today

## 2022-08-30 NOTE — Assessment & Plan Note (Addendum)
Had initial viral syndrome with fevers, cough, dyspnea more than a week ago. Cough has persisted and he describes it as naggy and worse at night/early morning. Also has sputum production with blood and clots mixed in with his mucus. Most of the time this happens at night/early morning. No nosebleeds.  Has history of incarceration and prior smoking history as well.   Respiratory exam is unremarkable. No oropharyngeal lesions or irritation of nasal mucosa. Airway very small, mallampati 4.  Given risk factors, will rule out TB and lung malignancy, can also consider laryngoscopy in the future if initial workup is unremarkable and he has persistent symptoms.  - QuantiFERON gold and CT chest -CBC today

## 2022-08-30 NOTE — Progress Notes (Addendum)
   CC: cough with blood  HPI:  Mr.Lazlo Shatto is a 47 y.o.-year-old male with past medical history as below presenting for coughing up blood.  Please see encounters tab for problem-based charting.  Past Medical History:  Diagnosis Date   Anxiety disorder    At risk for obstructive sleep apnea 04/15/2018   Bipolar 1 disorder ([place])    Depression    Hepatitis B immune 04/15/2018   HLD (hyperlipidemia)    Hypertension    Schizophrenia ([place])    Review of Systems: As in HPI.  Please see encounters tab for problem based charting.  Physical Exam:  Vitals:   08/30/22 1316  BP: 124/78  Pulse: 84  Temp: 98.6 F (37 C)  TempSrc: Oral  SpO2: 98%  Weight: 274 lb (124.3 kg)  Height: 6\' 5"  (1.956 m)   General:Well-appearing, pleasant, In NAD HEENT: no bleeding, no oropharyngeal lesions, mallampati 4 Cardiac: RRR, no murmurs rubs or gallops. Respiratory: Normal work of breathing on room air, CTAB Abdominal: Soft, nontender, nondistended   Assessment & Plan:   Cough Had initial viral syndrome with fevers, cough, dyspnea more than a week ago. Cough has persisted and he describes it as naggy and worse at night/early morning. Also has sputum production with blood and clots mixed in with his mucus. Most of the time this happens at night/early morning. No nosebleeds.  Has history of incarceration and prior smoking history as well.   Respiratory exam is unremarkable. No oropharyngeal lesions or irritation of nasal mucosa. Airway very small, mallampati 4.  Given risk factors, will rule out TB and lung malignancy, can also consider laryngoscopy in the future if initial workup is unremarkable and he has persistent symptoms.  - QuantiFERON gold and CT chest -CBC today  At risk for obstructive sleep apnea Experiences witnessed nocturnal apneic episodes, daytime fatigue and sleepiness, brain fog. BMI 32.49, small airway (mallampati 4). High suspicion for OSA.  Will refer for sleep  study  HTN (hypertension) BP controlled on lisinopril 20, will check BMP today  HLD (hyperlipidemia) On lipitor 40, primary prevention, will check lipid profile today.  Healthcare maintenance Will check A1c today  Addendum: See lab results, rechecking lipids/A1c, if still elevated would start t2dm treatment, he would likely benefit regardless from at least metformin. Triglycerides elevated on lipitor 40. Repeating fasting labs and if still up, can consider adding zetia or fibrate.   Fortunately, his hemoptysis has resolved and Quant GOLD is negative. Please make sure he is able to schedule sleep study at [facility].  Patient discussed with Dr. Dareen Piano

## 2022-08-31 DIAGNOSIS — Z0000 Encounter for general adult medical examination without abnormal findings: Secondary | ICD-10-CM | POA: Insufficient documentation

## 2022-08-31 LAB — CBC WITH DIFFERENTIAL/PLATELET
Basophils Absolute: 0.1 10*3/uL (ref 0.0–0.2)
Basos: 1 %
EOS (ABSOLUTE): 0.1 10*3/uL (ref 0.0–0.4)
Eos: 2 %
Hematocrit: 46.1 % (ref 37.5–51.0)
Hemoglobin: 15.1 g/dL (ref 13.0–17.7)
Immature Grans (Abs): 0 10*3/uL (ref 0.0–0.1)
Immature Granulocytes: 0 %
Lymphocytes Absolute: 3.4 10*3/uL — ABNORMAL HIGH (ref 0.7–3.1)
Lymphs: 41 %
MCH: 26.1 pg — ABNORMAL LOW (ref 26.6–33.0)
MCHC: 32.8 g/dL (ref 31.5–35.7)
MCV: 80 fL (ref 79–97)
Monocytes Absolute: 0.8 10*3/uL (ref 0.1–0.9)
Monocytes: 9 %
Neutrophils Absolute: 4 10*3/uL (ref 1.4–7.0)
Neutrophils: 47 %
Platelets: 323 10*3/uL (ref 150–450)
RBC: 5.78 x10E6/uL (ref 4.14–5.80)
RDW: 13.8 % (ref 11.6–15.4)
WBC: 8.4 10*3/uL (ref 3.4–10.8)

## 2022-08-31 NOTE — Assessment & Plan Note (Signed)
Experiences witnessed nocturnal apneic episodes, daytime fatigue and sleepiness, brain fog. BMI 32.49, small airway (mallampati 4). High suspicion for OSA.  Will refer for sleep study

## 2022-08-31 NOTE — Assessment & Plan Note (Signed)
BP controlled on lisinopril 20, will check BMP today

## 2022-08-31 NOTE — Assessment & Plan Note (Signed)
On lipitor 40, primary prevention, will check lipid profile today.

## 2022-08-31 NOTE — Assessment & Plan Note (Signed)
Will check A1c today. 

## 2022-09-04 LAB — LIPID PANEL
Chol/HDL Ratio: 4.2 ratio (ref 0.0–5.0)
Cholesterol, Total: 146 mg/dL (ref 100–199)
HDL: 35 mg/dL — ABNORMAL LOW (ref >39)
LDL Chol Calc (NIH): 61 mg/dL (ref 0–99)
Triglycerides: 320 mg/dL — ABNORMAL HIGH (ref 0–149)
VLDL Cholesterol Cal: 50 mg/dL — ABNORMAL HIGH (ref 5–40)

## 2022-09-04 LAB — BMP8+ANION GAP
Anion Gap: 14 mmol/L (ref 10.0–18.0)
BUN/Creatinine Ratio: 8 — ABNORMAL LOW (ref 9–20)
BUN: 12 mg/dL (ref 6–24)
CO2: 22 mmol/L (ref 20–29)
Calcium: 9.1 mg/dL (ref 8.7–10.2)
Chloride: 103 mmol/L (ref 96–106)
Creatinine, Ser: 1.45 mg/dL — ABNORMAL HIGH (ref 0.76–1.27)
Glucose: 103 mg/dL — ABNORMAL HIGH (ref 70–99)
Potassium: 4.9 mmol/L (ref 3.5–5.2)
Sodium: 139 mmol/L (ref 134–144)
eGFR: 61 mL/min/{1.73_m2} (ref >59)

## 2022-09-04 LAB — QUANTIFERON-TB GOLD PLUS
QuantiFERON Mitogen Value: >10 IU/mL
QuantiFERON Nil Value: 0 IU/mL
QuantiFERON TB1 Ag Value: 0.09 IU/mL
QuantiFERON TB2 Ag Value: 0.06 IU/mL
QuantiFERON-TB Gold Plus: NEGATIVE

## 2022-09-04 LAB — HEMOGLOBIN A1C
Est. average glucose Bld gHb Est-mCnc: 146 mg/dL
Hgb A1c MFr Bld: 6.7 % — ABNORMAL HIGH (ref 4.8–5.6)

## 2022-09-05 NOTE — Progress Notes (Signed)
Internal Medicine Clinic Attending  Case discussed with Dr. Sridharan  At the time of the visit.  We reviewed the resident's history and exam and pertinent patient test results.  I agree with the assessment, diagnosis, and plan of care documented in the resident's note.  

## 2022-09-07 NOTE — Addendum Note (Signed)
Addended byLinus Galas on: 09/07/2022 06:22 PM   Modules accepted: Orders

## 2022-09-11 ENCOUNTER — Ambulatory Visit (INDEPENDENT_AMBULATORY_CARE_PROVIDER_SITE_OTHER): Payer: Medicaid Other | Admitting: Student

## 2022-09-11 DIAGNOSIS — Z13220 Encounter for screening for lipoid disorders: Secondary | ICD-10-CM | POA: Diagnosis present

## 2022-09-11 DIAGNOSIS — Z131 Encounter for screening for diabetes mellitus: Secondary | ICD-10-CM

## 2022-09-11 DIAGNOSIS — E785 Hyperlipidemia, unspecified: Secondary | ICD-10-CM

## 2022-09-11 NOTE — Progress Notes (Signed)
Lab only visit 

## 2022-09-12 LAB — LIPID PANEL

## 2022-09-13 LAB — LIPID PANEL
Chol/HDL Ratio: 4.2 ratio (ref 0.0–5.0)
Cholesterol, Total: 133 mg/dL (ref 100–199)
HDL: 32 mg/dL — ABNORMAL LOW (ref >39)
LDL Chol Calc (NIH): 79 mg/dL (ref 0–99)
Triglycerides: 121 mg/dL (ref 0–149)
VLDL Cholesterol Cal: 22 mg/dL (ref 5–40)

## 2022-09-13 LAB — HEMOGLOBIN A1C
Est. average glucose Bld gHb Est-mCnc: 154 mg/dL
Hgb A1c MFr Bld: 7 % — ABNORMAL HIGH (ref 4.8–5.6)

## 2022-09-14 ENCOUNTER — Ambulatory Visit (INDEPENDENT_AMBULATORY_CARE_PROVIDER_SITE_OTHER): Payer: Medicaid Other

## 2022-09-14 ENCOUNTER — Other Ambulatory Visit: Payer: Self-pay | Admitting: Gastroenterology

## 2022-09-14 VITALS — BP 131/80 | HR 79 | Temp 98.8°F | Ht 77.0 in | Wt 273.8 lb

## 2022-09-14 DIAGNOSIS — Z23 Encounter for immunization: Secondary | ICD-10-CM | POA: Diagnosis not present

## 2022-09-14 DIAGNOSIS — Z794 Long term (current) use of insulin: Secondary | ICD-10-CM

## 2022-09-14 DIAGNOSIS — Z0000 Encounter for general adult medical examination without abnormal findings: Secondary | ICD-10-CM

## 2022-09-14 DIAGNOSIS — E119 Type 2 diabetes mellitus without complications: Secondary | ICD-10-CM | POA: Diagnosis present

## 2022-09-14 DIAGNOSIS — E785 Hyperlipidemia, unspecified: Secondary | ICD-10-CM

## 2022-09-14 DIAGNOSIS — E0821 Diabetes mellitus due to underlying condition with diabetic nephropathy: Secondary | ICD-10-CM | POA: Insufficient documentation

## 2022-09-14 DIAGNOSIS — M25511 Pain in right shoulder: Secondary | ICD-10-CM | POA: Diagnosis not present

## 2022-09-14 DIAGNOSIS — I10 Essential (primary) hypertension: Secondary | ICD-10-CM | POA: Diagnosis not present

## 2022-09-14 DIAGNOSIS — I152 Hypertension secondary to endocrine disorders: Secondary | ICD-10-CM | POA: Insufficient documentation

## 2022-09-14 MED ORDER — METFORMIN HCL ER 500 MG PO TB24: 500.0000 mg | ORAL_TABLET | Freq: Every day | ORAL | 2 refills | Status: DC

## 2022-09-14 NOTE — Assessment & Plan Note (Signed)
Patient is currently taking Atorvastatin 40 mg daily. Lipid panel from 3 days ago WNL w/ exception of HDL 32. ASCVD risk 20.8%.   Plan: - Continue Atorvastatin 40 mg daily

## 2022-09-14 NOTE — Assessment & Plan Note (Signed)
Patient developed right shoulder pain 3 weeks ago that began while getting up from bed. He reports lifting himself up with his right arm when he felt a pop in his right shoulder. Patient states that the pain worsens w/ pushing with his right arm. He notes some improvement with icing. Patient has not tried tylenol or ibuprofen yet. On exam, patient has mild pain with abduction of the RUE w/o weakness. Negative impingement test. No pain w/ external rotation or Gerber's test of the RUE. Suspect mild supraspinatus strain.   Plan: - Tylenol and ice at home - Limit ibuprofen use given elevated creatinine - OTC lidocaine patch

## 2022-09-14 NOTE — Assessment & Plan Note (Signed)
Administered tdap and influenza vaccine today.

## 2022-09-14 NOTE — Patient Instructions (Addendum)
Thank you for coming to see [place] in clinic Mr. Holzmann.   Plan:  - Please start taking metformin 500 mg with the following schedule:  500 mg (1 tablet) daily with breakfast for 7 days (week 1)  Followed by:   500 mg (1 tablet) with breakfast and 500 mg (1 tablet) with dinner for 7 days (week 2)  Followed by:   1000 mg (2 tablets) with breakfast and 500 mg (1 tablet) with dinner for 7 days (week 3)  Followed by:   1000 mg (2 tablets) with breakfast and 1000 mg (2 tablets) with dinner from there on (week 4 and after)   - We got your urine today to check your kidney function (we will call you with these results)  - We referred you to an eye doctor (ophthalmology) for yearly eye exams (you will be called to schedule an appointment)  - We gave you the tetanus booster and influenza vaccine today  - Please try tylenol and ice at home for your shoulder pain (please limit ibuprofen use to a few times a week as this medication can damage your kidneys), can also try over the counter lidocaine patches or voltaren gel to help with the pain  It was very nice to see you, thank you for allowing [place] to be involved in your care.

## 2022-09-14 NOTE — Assessment & Plan Note (Signed)
Patient is not currently on any medications for this condition. Patient endorses polyuria, polydipsia, fatigue over the last year. Patient states that he has not visited the ophthalmologist for yearly eye exams before. A1c was 7.0%  three days ago.  Plan: - Start metformin (500 mg ramp up to 2000 mg daily) - Urine ACR today - A1c in 3 months - Referral to ophthalmology for yearly eye exams

## 2022-09-14 NOTE — Assessment & Plan Note (Signed)
Current medications include lisinopril 20 mg daily. Patient states that they are compliant with this medication. Patient states that they do not check their BP regularly at home. Patient denies HA, lightheadedness, dizziness, CP, or SOB. Initial BP today is 131/80.   Plan: - Continue lisinopril 20 mg daily

## 2022-09-14 NOTE — Progress Notes (Signed)
CC: arm pain  HPI:  [name] is a 47 y.o. male with past medical history of HTN, HLD, IBS, GERD, chronic HA, schizophrenia, BPD, anxiety, depression, and tobacco use disorder that presents for arm pain.   Patient developed right shoulder pain 3 weeks ago that began while getting up from bed. He reports lifting himself up with his right arm when he felt a pop in his right shoulder. Patient states that the pain worsens w/ pushing with his right arm. He notes some improvement with icing. Patient has not tried tylenol or ibuprofen yet.   Patient has a new diagnosis of T2DM. He is not currently on any medications for this condition. Patient endorses polyuria, polydipsia, fatigue over the last year. Patient states that he has not visited the ophthalmologist for yearly eye exams before.   Patient has a history of HTN. Current medications include lisinopril 20 mg daily. Patient states that they are compliant with this medication. Patient states that they do not check their BP regularly at home. Patient denies HA, lightheadedness, dizziness, CP, or SOB.  Patient also has a history of HLD. Current medications inlcude Atorvastatin 40 mg daily. Patient states that they are compliant with this medication.   Patient is comfortable receiving tdap and influenza vaccine today.   Allergies as of 09/14/2022   No Known Allergies      Medication List        Accurate as of September 14, 2022 10:11 AM. If you have any questions, ask your nurse or doctor.          acetaminophen 500 MG tablet Commonly known as: TYLENOL Take 1 tablet by mouth every 6 hours as needed for pain.   atorvastatin 40 MG tablet Commonly known as: LIPITOR TAKE 1 TABLET (40 MG TOTAL) BY MOUTH DAILY.   dicyclomine 20 MG tablet Commonly known as: BENTYL TAKE 1 TABLET (20 MG TOTAL) BY MOUTH EVERY 6 (SIX) HOURS.   hydrOXYzine 25 MG tablet Commonly known as: ATARAX Take 25 mg by mouth 2 (two) times daily as needed.    lamoTRIgine 100 MG tablet Commonly known as: LAMICTAL Take 100 mg by mouth daily.   lisinopril 20 MG tablet Commonly known as: ZESTRIL TAKE 1 TABLET (20 MG TOTAL) BY MOUTH DAILY.   mirtazapine 15 MG tablet Commonly known as: REMERON Take 15 mg by mouth at bedtime.   pantoprazole 40 MG tablet Commonly known as: Protonix Take 1 tablet (40 mg total) by mouth 2 (two) times daily.   polyethylene glycol powder 17 GM/SCOOP powder Commonly known as: GLYCOLAX/MIRALAX Take 255 g by mouth daily.   prazosin 1 MG capsule Commonly known as: MINIPRESS Take 1 mg by mouth at bedtime.   senna 8.6 MG Tabs tablet Commonly known as: SENOKOT Take 1 tablet (8.6 mg total) by mouth daily as needed for mild constipation.   sertraline 100 MG tablet Commonly known as: ZOLOFT Take 150 mg by mouth daily.         Past Medical History:  Diagnosis Date   Anxiety disorder    At risk for obstructive sleep apnea 04/15/2018   Bipolar 1 disorder ([place])    Depression    Hepatitis B immune 04/15/2018   HLD (hyperlipidemia)    Hypertension    Schizophrenia ([place])    Review of Systems:  per HPI.   Physical Exam: Vitals:   09/14/22 1001  BP: 131/80  Pulse: 79  Temp: 98.8 F (37.1 C)  TempSrc: Oral  SpO2: 99%  Weight: 273 lb 12.8 oz (124.2 kg)  Height: 6\' 5"  (1.956 m)   Constitutional: Well-developed, well-nourished, appears comfortable  HENT: Normocephalic and atraumatic.  Eyes: EOM are normal. PERRL.  Neck: Normal range of motion.  Cardiovascular: Regular rate, regular rhythm. No murmurs, rubs, or gallops. Normal radial and PT pulses bilaterally. No LE edema.  Pulmonary: Normal respiratory effort. No wheezes, rales, or rhonchi.   Abdominal: Soft. Non-distended. No tenderness. Normal bowel sounds.  Musculoskeletal: Normal range of motion. Mild pain with abduction of the RUE w/o weakness. Negative impingement test. No pain w/ external rotation or Gerber's test of the RUE.    Neurological:  Alert and oriented to person, place, and time. Non-focal. Skin: warm and dry.    Assessment & Plan:   See Encounters Tab for problem based charting.  Patient seen with Dr. Evette Doffing

## 2022-09-17 ENCOUNTER — Ambulatory Visit (HOSPITAL_COMMUNITY)
Admission: RE | Admit: 2022-09-17 | Discharge: 2022-09-17 | Disposition: A | Discharge status: Home / Self Care | Payer: Medicaid Other | Source: Ambulatory Visit | Attending: Internal Medicine | Admitting: Internal Medicine

## 2022-09-17 DIAGNOSIS — R051 Acute cough: Secondary | ICD-10-CM | POA: Diagnosis not present

## 2022-09-17 LAB — MICROALBUMIN / CREATININE URINE RATIO
Creatinine, Urine: 188.1 mg/dL
Microalb/Creat Ratio: 6 mg/g creat (ref 0–29)
Microalbumin, Urine: 12.1 ug/mL

## 2022-09-17 MED ORDER — IOHEXOL 350 MG/ML SOLN
50.0000 mL | Freq: Once | INTRAVENOUS | Status: AC | PRN
  Administered 2022-09-17: 50 mL via INTRAVENOUS

## 2022-09-17 NOTE — Progress Notes (Signed)
Internal Medicine Clinic Attending  Case discussed with Dr. Mapp  At the time of the visit.  We reviewed the resident's history and exam and pertinent patient test results.  I agree with the assessment, diagnosis, and plan of care documented in the resident's note.  

## 2022-09-19 ENCOUNTER — Telehealth: Payer: Self-pay

## 2022-09-19 NOTE — Telephone Encounter (Signed)
Requesting CT test results, please call pt back.

## 2022-09-28 ENCOUNTER — Other Ambulatory Visit: Payer: Self-pay

## 2022-09-28 ENCOUNTER — Ambulatory Visit (INDEPENDENT_AMBULATORY_CARE_PROVIDER_SITE_OTHER): Payer: Medicaid Other

## 2022-09-28 VITALS — BP 121/66 | HR 77 | Temp 98.4°F | Resp 28 | Ht 77.0 in | Wt 273.6 lb

## 2022-09-28 DIAGNOSIS — R911 Solitary pulmonary nodule: Secondary | ICD-10-CM | POA: Diagnosis present

## 2022-09-28 DIAGNOSIS — Z7984 Long term (current) use of oral hypoglycemic drugs: Secondary | ICD-10-CM

## 2022-09-28 DIAGNOSIS — Z87891 Personal history of nicotine dependence: Secondary | ICD-10-CM

## 2022-09-28 DIAGNOSIS — I10 Essential (primary) hypertension: Secondary | ICD-10-CM

## 2022-09-28 DIAGNOSIS — E119 Type 2 diabetes mellitus without complications: Secondary | ICD-10-CM

## 2022-09-28 DIAGNOSIS — R0602 Shortness of breath: Secondary | ICD-10-CM

## 2022-09-28 MED ORDER — ALBUTEROL SULFATE HFA 108 (90 BASE) MCG/ACT IN AERS: 2.0000 | INHALATION_SPRAY | Freq: Four times a day (QID) | RESPIRATORY_TRACT | 4 refills | Status: AC | PRN

## 2022-09-28 NOTE — Progress Notes (Signed)
CC: SOB  HPI:  Bruce [name] is a 47 y.o. male with past medical history of HTN, HLD, T2DM, GERD, schizophrenia, BPD, anxiety, depression, and TUD that presents for SOB.   Patient was last seen in clinic for SOB on 1/12. Patient reported fevers, cough, and SOB for 1-2 weeks. Also noted increased sputum production with blood clots in his mucus. Patient has a history of incarceration and prior smoking history. Plan was to perform CT chest and Quantiferon gold to r/o malignancy vs TB. Quantiferon gold was negative. CT demonstrated moderate paraseptal/centrilobular emphysema suggesting COPD. Also noted 2X small subpleural right pulmonary nodules (largest 0.3 cm, likely benign w/ recommendation of repeat non-con CT chest in 12 months). Patient continues to have SOB that worsens at night and with activity. He also feels as though he is choking at night and wakes up having difficulty breathing. Patient reports snoring at night, daytime fatigue, and girlfriend has seen him stop breathing at night. He reports that his cough has resolved. He denies chest pain or LE swelling. He reports using an albuterol inhaler as a child but has not used this medication in several years.   Patient has a history of T2DM. Current medications include metformin 500 mg (currently 2X in the morning and 1X at night). Patient states that they are compliant with these medications. Patient does not check their blood sugar at home regularly and notes values. Patient endorses polyuria, polydipsia, fatigue over the last year. Patient was referred to see an ophthalmologist for yearly eye exams at our last visit.  Patient has a history of HTN. Current medications include lisinopril 20 mg daily. Patient states that they are compliant with this medication. Patient states that they do not check their BP regularly at home. Patient denies HA, lightheadedness, dizziness, or CP.   Allergies as of 09/28/2022   No Known Allergies       Medication List        Accurate as of September 28, 2022  9:19 AM. If you have any questions, ask your nurse or doctor.          acetaminophen 500 MG tablet Commonly known as: TYLENOL Take 1 tablet by mouth every 6 hours as needed for pain.   albuterol 108 (90 Base) MCG/ACT inhaler Commonly known as: VENTOLIN HFA Inhale 2 puffs into the lungs every 6 (six) hours as needed for wheezing or shortness of breath. Started by: Starlyn Skeans, MD   atorvastatin 40 MG tablet Commonly known as: LIPITOR TAKE 1 TABLET (40 MG TOTAL) BY MOUTH DAILY.   dicyclomine 20 MG tablet Commonly known as: BENTYL TAKE 1 TABLET (20 MG TOTAL) BY MOUTH EVERY 6 (SIX) HOURS.   hydrOXYzine 25 MG tablet Commonly known as: ATARAX Take 25 mg by mouth 2 (two) times daily as needed.   lamoTRIgine 100 MG tablet Commonly known as: LAMICTAL Take 100 mg by mouth daily.   lisinopril 20 MG tablet Commonly known as: ZESTRIL TAKE 1 TABLET (20 MG TOTAL) BY MOUTH DAILY.   metFORMIN 500 MG 24 hr tablet Commonly known as: GLUCOPHAGE-XR Take 1 tablet (500 mg total) by mouth daily with breakfast. Please start taking metformin 500 mg with the following schedule: 500 mg (1 tablet) daily with breakfast for 7 days (week 1), Followed by: 500 mg (1 tablet) with breakfast and 500 mg (1 tablet) with dinner for 7 days (week 2), Followed by: 1000 mg (2 tablets) with breakfast and 500 mg (1 tablet) with dinner for 7 days (week  3), Followed by: 1000 mg (2 tablets) with breakfast and 1000 mg (2 tablets) with dinner from there on (week 4 and after)   mirtazapine 15 MG tablet Commonly known as: REMERON Take 15 mg by mouth at bedtime.   pantoprazole 40 MG tablet Commonly known as: PROTONIX TAKE 1 TABLET (40 MG TOTAL) BY MOUTH 2 (TWO) TIMES DAILY.   polyethylene glycol powder 17 GM/SCOOP powder Commonly known as: GLYCOLAX/MIRALAX Take 255 g by mouth daily.   prazosin 1 MG capsule Commonly known as: MINIPRESS Take 1 mg by mouth at  bedtime.   senna 8.6 MG Tabs tablet Commonly known as: SENOKOT Take 1 tablet (8.6 mg total) by mouth daily as needed for mild constipation.   sertraline 100 MG tablet Commonly known as: ZOLOFT Take 150 mg by mouth daily.         Past Medical History:  Diagnosis Date   Anxiety disorder    At risk for obstructive sleep apnea 04/15/2018   Bipolar 1 disorder ([place])    Depression    Hepatitis B immune 04/15/2018   HLD (hyperlipidemia)    Hypertension    Schizophrenia ([place])    Review of Systems:  per HPI.   Physical Exam: Vitals:   09/28/22 0832 09/28/22 0836  BP: (!) 140/75 121/66  Pulse: 82 77  Resp: (!) 28   Temp: 98.4 F (36.9 C)   TempSrc: Oral   SpO2: 100%   Weight: 273 lb 9.6 oz (124.1 kg)   Height: 6' 5"$  (1.956 m)    Constitutional: Well-developed, well-nourished, appears comfortable  HENT: Normocephalic and atraumatic.  Eyes: EOM are normal. PERRL.  Neck: Normal range of motion.  Cardiovascular: Regular rate, regular rhythm. No murmurs, rubs, or gallops. Normal radial and PT pulses bilaterally. No LE edema.  Pulmonary: Normal respiratory effort. No wheezes, rales, or rhonchi. No crackles.  Abdominal: Soft. Non-distended. No tenderness. Normal bowel sounds.  Musculoskeletal: Normal range of motion.     Neurological: Alert and oriented to person, place, and time. Non-focal. Skin: warm and dry.    Assessment & Plan:   See Encounters Tab for problem based charting.  Patient seen with Dr. Cain Sieve.

## 2022-09-28 NOTE — Assessment & Plan Note (Addendum)
Patient was last seen in clinic for SOB on 1/12. Patient reported fevers, cough, and SOB for 1-2 weeks. Also noted increased sputum production with blood clots in his mucus. Patient has a history of incarceration and prior smoking history. Plan was to perform CT chest and Quantiferon gold to r/o malignancy vs TB. Quantiferon gold was negative. CT demonstrated moderate paraseptal/centrilobular emphysema suggesting COPD. Also noted 2X small subpleural right pulmonary nodules (largest 0.3 cm, likely benign w/ recommendation of repeat non-con CT chest in 12 months). Patient continues to have SOB that worsens at night and with activity. He also feels as though he is choking at night and wakes up having difficulty breathing. Patient reports snoring at night, daytime fatigue, and girlfriend has seen him stop breathing at night. He reports that his cough has resolved. He denies chest pain or LE swelling. He reports using an albuterol inhaler as a child but has not used this medication in several years. STOP-BANG score is 5. Differential diagnosis includes COPD and OSA. Can perform CAT (COPD) assessment score at next visit to help with inhaler regimen if changes are needed.   Plan: - Pulmonary function testing ordered - Sleep study ordered - Start albuterol - Repeat CT chest in 12 months

## 2022-09-28 NOTE — Assessment & Plan Note (Signed)
Patient has a history of HTN. Current medications include lisinopril 20 mg daily. Patient states that they are compliant with this medication. Patient states that they do not check their BP regularly at home. Patient denies HA, lightheadedness, dizziness, or CP. Initial BP today is 140/75. Repeat BP is 121/66.     Plan: - Continue lisinopril 20 mg daily

## 2022-09-28 NOTE — Patient Instructions (Signed)
Thank you for coming to see [place] in clinic Bruce [name].   Plan:  - Please start using albuterol for your shortness of breath  - We referred you for a sleep study (to determine if you have obstructive sleep apnea) and for pulmonary function testing (to determine whether or not you have asthma vs COPD); you will be called to schedule an appointment.   - We will repeat CT imaging of your chest in about 12 months to assess for change of your small lung nodules    It was very nice to see you, thank you for allowing [place] to be involved in your care.   Please arrive to your next appointment 5-10 minutes before your scheduled appointment time, thank you!

## 2022-09-28 NOTE — Progress Notes (Signed)
Internal Medicine Clinic Attending  Case discussed with Dr. Alton Revere  At the time of the visit.  We reviewed the resident's history and exam and pertinent patient test results.  I agree with the assessment, diagnosis, and plan of care documented in the resident's note.

## 2022-09-28 NOTE — Assessment & Plan Note (Signed)
Patient has a history of T2DM. Current medications include metformin 500 mg (currently 2X in the morning and 1X at night). Patient states that they are compliant with these medications. Patient does not check their blood sugar at home regularly and notes values. Patient endorses polyuria, polydipsia, fatigue over the last year. Patient was referred to see an ophthalmologist for yearly eye exams at our last visit. A1c was 7.0% two weeks ago.   Plan: - Continue metformin (to 2000 mg daily) - A1c in 2.5 months - F/u ophthalmology for yearly eye exams

## 2022-10-12 ENCOUNTER — Ambulatory Visit (HOSPITAL_COMMUNITY)
Admission: RE | Admit: 2022-10-12 | Discharge: 2022-10-12 | Disposition: A | Discharge status: Home / Self Care | Payer: Medicaid Other | Source: Ambulatory Visit | Attending: Internal Medicine | Admitting: Internal Medicine

## 2022-10-12 DIAGNOSIS — R911 Solitary pulmonary nodule: Secondary | ICD-10-CM | POA: Diagnosis present

## 2022-10-20 NOTE — Progress Notes (Signed)
Called and updated on results unchanged from prior CT and recommendation for repeat CT next year.

## 2022-10-31 ENCOUNTER — Other Ambulatory Visit: Payer: Self-pay | Admitting: Internal Medicine

## 2022-10-31 DIAGNOSIS — E785 Hyperlipidemia, unspecified: Secondary | ICD-10-CM

## 2022-11-01 ENCOUNTER — Other Ambulatory Visit: Payer: Self-pay

## 2022-11-26 ENCOUNTER — Other Ambulatory Visit: Payer: Self-pay | Admitting: Gastroenterology

## 2022-11-27 ENCOUNTER — Encounter: Payer: Medicaid Other | Admitting: Student

## 2022-11-28 ENCOUNTER — Encounter: Payer: Medicaid Other | Admitting: Student

## 2022-12-07 ENCOUNTER — Ambulatory Visit (HOSPITAL_COMMUNITY)
Admission: RE | Admit: 2022-12-07 | Discharge: 2022-12-07 | Disposition: A | Discharge status: Home / Self Care | Payer: Medicaid Other | Source: Ambulatory Visit | Attending: Family Medicine | Admitting: Family Medicine

## 2022-12-07 ENCOUNTER — Ambulatory Visit: Payer: Medicaid Other

## 2022-12-07 VITALS — BP 132/69 | HR 107 | Temp 98.5°F | Ht 77.0 in | Wt 285.5 lb

## 2022-12-07 DIAGNOSIS — R109 Unspecified abdominal pain: Secondary | ICD-10-CM

## 2022-12-07 DIAGNOSIS — I10 Essential (primary) hypertension: Secondary | ICD-10-CM

## 2022-12-07 DIAGNOSIS — R0602 Shortness of breath: Secondary | ICD-10-CM | POA: Diagnosis not present

## 2022-12-07 DIAGNOSIS — F17211 Nicotine dependence, cigarettes, in remission: Secondary | ICD-10-CM | POA: Diagnosis not present

## 2022-12-07 DIAGNOSIS — Z9189 Other specified personal risk factors, not elsewhere classified: Secondary | ICD-10-CM

## 2022-12-07 DIAGNOSIS — I2089 Other forms of angina pectoris: Secondary | ICD-10-CM | POA: Diagnosis not present

## 2022-12-07 DIAGNOSIS — R079 Chest pain, unspecified: Secondary | ICD-10-CM

## 2022-12-07 DIAGNOSIS — M545 Low back pain, unspecified: Secondary | ICD-10-CM | POA: Insufficient documentation

## 2022-12-07 DIAGNOSIS — F17200 Nicotine dependence, unspecified, uncomplicated: Secondary | ICD-10-CM

## 2022-12-07 DIAGNOSIS — E119 Type 2 diabetes mellitus without complications: Secondary | ICD-10-CM

## 2022-12-07 MED ORDER — NITROGLYCERIN 0.4 MG SL SUBL: 0.4000 mg | SUBLINGUAL_TABLET | SUBLINGUAL | 3 refills | Status: DC | PRN

## 2022-12-07 MED ORDER — ASPIRIN 81 MG PO TBEC: 81.0000 mg | DELAYED_RELEASE_TABLET | Freq: Every day | ORAL | 2 refills | Status: AC

## 2022-12-07 MED ORDER — METOPROLOL SUCCINATE ER 25 MG PO TB24: 25.0000 mg | ORAL_TABLET | Freq: Every day | ORAL | 0 refills | Status: DC

## 2022-12-07 NOTE — Assessment & Plan Note (Signed)
Has worsening dyspnea on exertion. Can walk up 5-7 steps and has to stop to take a break. Now tries albuterol to help with this but it's not helping much. Has not smoked in about a year. He also has chest pain on exertion.

## 2022-12-07 NOTE — Assessment & Plan Note (Addendum)
Started 2 months ago, no hematuria, no dysuria, urinating more often than usual. Pain is worse when he lays down but not when he walks around. No fevers or chills. No trauma to area.

## 2022-12-07 NOTE — Patient Instructions (Signed)
[name], it was a pleasure seeing you today! You endorsed feeling well today. Below are some of the things we talked about this visit. We look forward to seeing you in the follow up appointment!  Today we discussed: Chest pain: I am starting metoprolol  daily, baby aspirin for your heart. You can take the nitroglycerin strips under your tongue if you';re having chest pain. You can take another one after 5-10 minutes. If you're still having chest pain, call our clinic or come to the ED. I'm referring you to the heart doctors and ordering an ECHOcardiogram.   I have ordered the following labs today:  Lab Orders  No laboratory test(s) ordered today      Referrals ordered today:   Referral Orders         Ambulatory referral to Cardiology      I have ordered the following medication/changed the following medications:   Stop the following medications: There are no discontinued medications.   Start the following medications: Meds ordered this encounter  Medications   metoprolol succinate (TOPROL XL) 25 MG 24 hr tablet    Sig: Take 1 tablet (25 mg total) by mouth daily.    Dispense:  30 tablet    Refill:  0   aspirin EC 81 MG tablet    Sig: Take 1 tablet (81 mg total) by mouth daily. Swallow whole.    Dispense:  30 tablet    Refill:  2   nitroGLYCERIN (NITROSTAT) 0.4 MG SL tablet    Sig: Place 1 tablet (0.4 mg total) under the tongue every 5 (five) minutes as needed for chest pain.    Dispense:  100 tablet    Refill:  3     Follow-up:  4 week chest pain, breathing, diabetes    Please make sure to arrive 15 minutes prior to your next appointment. If you arrive late, you may be asked to reschedule.   We look forward to seeing you next time. Please call our clinic at [phone] if you have any questions or concerns. The best time to call is Monday-Friday from 9am-4pm, but there is someone available 24/7. If after hours or the weekend, call the main hospital number and  ask for the Internal Medicine Resident On-Call. If you need medication refills, please notify your pharmacy one week in advance and they will send [place] a request.  Thank you for letting us take part in your care. Wishing you the best!  Thank you, Lyndle Herrlich MD

## 2022-12-07 NOTE — Assessment & Plan Note (Signed)
Has persistent symptoms, dyspnea when laying flat, waking up gasping for air, daytime fatigue. Has not been able to get sleep study. - Recent sleep study referral today.

## 2022-12-09 DIAGNOSIS — I2089 Other forms of angina pectoris: Secondary | ICD-10-CM | POA: Insufficient documentation

## 2022-12-09 NOTE — Assessment & Plan Note (Signed)
He has not smoked in about a year now.  Congratulated him on this.

## 2022-12-09 NOTE — Assessment & Plan Note (Signed)
Will address at next visit 

## 2022-12-09 NOTE — Progress Notes (Signed)
CC: routine follow up  HPI:  [name] is a 47 y.o.-year-old male with past medical history as below presenting for routine follow up.  Of note, he did endorse some chest pain and dyspnea during this visit.  Chose to address this acute issue and can follow-up on chronic disease management at close office visit.  Please see encounters tab for problem-based charting.  Past Medical History:  Diagnosis Date   Anxiety disorder    At risk for obstructive sleep apnea 04/15/2018   Bipolar 1 disorder (HCC)    Depression    Hepatitis B immune 04/15/2018   HLD (hyperlipidemia)    Hypertension    Schizophrenia (HCC)    Review of Systems: As in HPI.  Please see encounters tab for problem based charting.  Physical Exam:  Vitals:   12/07/22 0959  BP: 132/69  Pulse: (!) 107  Temp: 98.5 F (36.9 C)  TempSrc: Oral  SpO2: 99%  Weight: 285 lb 8 oz (129.5 kg)  Height:  (1.956 m)  O2 sats maintained while ambulating on room air  General:Well-appearing, pleasant, In NAD Cardiac: RRR, no murmurs rubs or gallops. Respiratory: Normal work of breathing on room air, CTAB, no wheezing   Assessment & Plan:   Flank pain Started 2 months ago, no hematuria, no dysuria, urinating more often than usual. Pain is worse when he lays down but not when he walks around. No fevers or chills. No trauma to area.  Exam is unremarkable.  Unfortunately, unable to fully evaluate during this visit due to concern for chest pain and dyspnea.  Need to readdress at next visit.  I think this is likely musculoskeletal pain, but can further workup if needed.  1 thing to keep in mind is AAA or dissection with his history of smoking hypertension, general vasculopathy and now it seems like stable angina, however he is too young to qualify for screening, and I did not think he needed emergent imaging as he was hemodynamically stable and symptoms resolved with rest/are more consistent with stable angina or more  attributable to his lung disease.  He has close follow-up and can reassess at next visit  At risk for obstructive sleep apnea Has persistent symptoms, dyspnea when laying flat, waking up gasping for air, daytime fatigue. Has not been able to get sleep study. - Recent sleep study referral today.  SOB (shortness of breath) Has worsening dyspnea on exertion. Can walk up 5-7 steps and has to stop to take a break. Now tries albuterol to help with this but it's not helping much. Has not smoked in about a year. He also has chest pain on exertion (see stable angina).  He was slightly dyspneic with ambulation around the hall in clinic, but O2 sats looked good even with ambulation.  He was not wheezing on exam and did not seem to be in any significant respiratory distress.  Do not think he is currently having an exacerbation of what is presumed to be COPD. - Follow-up PFTs, will recheck on the referral - Continue albuterol use as needed.  Consider adding LAMA ICS at next visit if he is having symptoms that are more attributable to his likely COPD  HTN (hypertension) BP controlled in clinic currently on lisinopril 20 mg daily.  See stable angina for more details.  Type 2 diabetes mellitus (HCC) Will address at next visit.  Tobacco use disorder He has not smoked in about a year now.  Congratulated him on this.  Stable  angina Patient did endorse some mild chest pain during the office visit As he was walking to the bathroom and then came back to his exam room.  The chest pain resolved spontaneously with rest.  She says that he does frequently have retrosternal chest pain/chest pressure and dyspnea when going up flights of stairs or walking moderate to long distances.  His symptoms are chronic and have not gotten acutely worse recently.  He has not had any prior ischemic cardiac workup.  Cardiac exam is unremarkable.  His vitals were stable during the visit, he was not hypoxic, or febrile, and he was  hemodynamically stable.  EKG shows NSR with T wave inversions in 3 and aVR which were also present during prior EKG in February 2023.  Believe his presentation is consistent with chronic stable angina, but he does need ischemic workup.  He has several risk factors including hypertension, smoking history, hyperlipidemia, diabetes, obesity. - Will start ASA 81, metoprolol succinate 25 mg daily to titrate up.  He is already on ACE inhibitor. - Will give supply of nitroglycerin to use for angina to see if this is helpful. - Echo and cardiology referral.  He might be a good candidate for coronary CTA.     Patient discussed with Bruce [name]

## 2022-12-09 NOTE — Assessment & Plan Note (Signed)
BP controlled in clinic currently on lisinopril 20 mg daily.  See stable angina for more details.

## 2022-12-09 NOTE — Assessment & Plan Note (Signed)
Patient did endorse some mild chest pain during the office visit As he was walking to the bathroom and then came back to his exam room.  The chest pain resolved spontaneously with rest.  She says that he does frequently have retrosternal chest pain/chest pressure and dyspnea when going up flights of stairs or walking moderate to long distances.  His symptoms are chronic and have not gotten acutely worse recently.  He has not had any prior ischemic cardiac workup.  Cardiac exam is unremarkable.  His vitals were stable during the visit, he was not hypoxic, or febrile, and he was hemodynamically stable.  EKG shows NSR with T wave inversions in 3 and aVR which were also present during prior EKG in February 2023.  Believe his presentation is consistent with chronic stable angina, but he does need ischemic workup.  He has several risk factors including hypertension, smoking history, hyperlipidemia, diabetes, obesity. - Will start ASA 81, metoprolol succinate 25 mg daily to titrate up.  He is already on ACE inhibitor. - Will give supply of nitroglycerin to use for angina to see if this is helpful. - Echo and cardiology referral.  He might be a good candidate for coronary CTA.

## 2022-12-10 NOTE — Progress Notes (Signed)
Internal Medicine Clinic Attending  Case discussed with Dr. Sridharan  At the time of the visit.  We reviewed the resident's history and exam and pertinent patient test results.  I agree with the assessment, diagnosis, and plan of care documented in the resident's note.  

## 2022-12-17 ENCOUNTER — Ambulatory Visit (HOSPITAL_COMMUNITY)
Admission: RE | Admit: 2022-12-17 | Discharge: 2022-12-17 | Disposition: A | Discharge status: Home / Self Care | Payer: Medicaid Other | Source: Ambulatory Visit | Attending: Internal Medicine | Admitting: Internal Medicine

## 2022-12-17 DIAGNOSIS — R0602 Shortness of breath: Secondary | ICD-10-CM | POA: Diagnosis present

## 2022-12-17 LAB — PULMONARY FUNCTION TEST
DL/VA % pred: 71 %
DL/VA: 3.18 ml/min/mmHg/L
DLCO unc % pred: 68 %
DLCO unc: 25.47 ml/min/mmHg
FEF 25-75 Post: 3.53 L/sec
FEF 25-75 Pre: 2.7 L/sec
FEF2575-%Change-Post: 30 %
FEF2575-%Pred-Post: 79 %
FEF2575-%Pred-Pre: 60 %
FEV1-%Change-Post: 8 %
FEV1-%Pred-Post: 80 %
FEV1-%Pred-Pre: 74 %
FEV1-Post: 4.05 L
FEV1-Pre: 3.74 L
FEV1FVC-%Change-Post: 1 %
FEV1FVC-%Pred-Pre: 91 %
FEV6-%Change-Post: 5 %
FEV6-%Pred-Post: 85 %
FEV6-%Pred-Pre: 81 %
FEV6-Post: 5.37 L
FEV6-Pre: 5.11 L
FEV6FVC-%Change-Post: 0 %
FEV6FVC-%Pred-Post: 100 %
FEV6FVC-%Pred-Pre: 100 %
FVC-%Change-Post: 6 %
FVC-%Pred-Post: 85 %
FVC-%Pred-Pre: 80 %
FVC-Post: 5.53 L
FVC-Pre: 5.21 L
Post FEV1/FVC ratio: 73 %
Post FEV6/FVC ratio: 97 %
Pre FEV1/FVC ratio: 72 %
Pre FEV6/FVC Ratio: 98 %
RV % pred: 109 %
RV: 2.52 L
TLC % pred: 101 %
TLC: 8.49 L

## 2022-12-17 MED ORDER — ALBUTEROL SULFATE (2.5 MG/3ML) 0.083% IN NEBU
2.5000 mg | INHALATION_SOLUTION | Freq: Once | RESPIRATORY_TRACT | Status: AC
  Administered 2022-12-17: 2.5 mg via RESPIRATORY_TRACT

## 2022-12-28 ENCOUNTER — Other Ambulatory Visit: Payer: Self-pay

## 2022-12-28 ENCOUNTER — Other Ambulatory Visit: Payer: Self-pay | Admitting: Gastroenterology

## 2022-12-28 NOTE — Telephone Encounter (Signed)
Next appt scheduled 5/17 with Dr Cyndie Chime.

## 2022-12-31 MED ORDER — DICYCLOMINE HCL 20 MG PO TABS: 20.0000 mg | ORAL_TABLET | Freq: Four times a day (QID) | ORAL | 2 refills | Status: DC

## 2022-12-31 MED ORDER — METOPROLOL SUCCINATE ER 25 MG PO TB24: 25.0000 mg | ORAL_TABLET | Freq: Every day | ORAL | 0 refills | Status: DC

## 2022-12-31 MED ORDER — PRAZOSIN HCL 1 MG PO CAPS: 1.0000 mg | ORAL_CAPSULE | Freq: Every day | ORAL | 0 refills | Status: DC

## 2022-12-31 MED ORDER — MIRTAZAPINE 15 MG PO TABS: 15.0000 mg | ORAL_TABLET | Freq: Every day | ORAL | 0 refills | Status: DC

## 2023-01-04 ENCOUNTER — Encounter: Payer: Self-pay | Admitting: Student

## 2023-01-04 ENCOUNTER — Ambulatory Visit (INDEPENDENT_AMBULATORY_CARE_PROVIDER_SITE_OTHER): Payer: Medicaid Other | Admitting: Student

## 2023-01-04 ENCOUNTER — Other Ambulatory Visit: Payer: Self-pay

## 2023-01-04 VITALS — BP 128/88 | HR 91 | Temp 98.5°F | Ht 77.0 in | Wt 277.9 lb

## 2023-01-04 DIAGNOSIS — E1159 Type 2 diabetes mellitus with other circulatory complications: Secondary | ICD-10-CM | POA: Diagnosis not present

## 2023-01-04 DIAGNOSIS — M5441 Lumbago with sciatica, right side: Secondary | ICD-10-CM | POA: Diagnosis not present

## 2023-01-04 DIAGNOSIS — M5442 Lumbago with sciatica, left side: Secondary | ICD-10-CM

## 2023-01-04 DIAGNOSIS — I152 Hypertension secondary to endocrine disorders: Secondary | ICD-10-CM | POA: Diagnosis not present

## 2023-01-04 DIAGNOSIS — E1169 Type 2 diabetes mellitus with other specified complication: Secondary | ICD-10-CM | POA: Diagnosis present

## 2023-01-04 DIAGNOSIS — E669 Obesity, unspecified: Secondary | ICD-10-CM | POA: Diagnosis not present

## 2023-01-04 DIAGNOSIS — J439 Emphysema, unspecified: Secondary | ICD-10-CM

## 2023-01-04 DIAGNOSIS — E119 Type 2 diabetes mellitus without complications: Secondary | ICD-10-CM

## 2023-01-04 DIAGNOSIS — G8929 Other chronic pain: Secondary | ICD-10-CM | POA: Diagnosis not present

## 2023-01-04 DIAGNOSIS — I2089 Other forms of angina pectoris: Secondary | ICD-10-CM

## 2023-01-04 DIAGNOSIS — Z7984 Long term (current) use of oral hypoglycemic drugs: Secondary | ICD-10-CM | POA: Diagnosis not present

## 2023-01-04 LAB — POCT GLYCOSYLATED HEMOGLOBIN (HGB A1C): Hemoglobin A1C: 7.1 % — AB (ref 4.0–5.6)

## 2023-01-04 LAB — GLUCOSE, CAPILLARY: Glucose-Capillary: 199 mg/dL — ABNORMAL HIGH (ref 70–99)

## 2023-01-04 MED ORDER — UMECLIDINIUM-VILANTEROL 62.5-25 MCG/ACT IN AEPB
1.0000 | INHALATION_SPRAY | Freq: Every day | RESPIRATORY_TRACT | 2 refills | Status: DC
Start: 2023-01-04 — End: 2023-02-04
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
## 2023-01-04 NOTE — Patient Instructions (Signed)
Mr. Spinelli,  It was nice seeing you in the clinic today.  Here is a summary what we talked about:  1.  I am glad that your chest pain has improved.  Please take a daily baby aspirin.  Please make an appointment with a cardiologist for stress test.  2.  I prescribed Anoro Ellipta, which is a daily inhaler for your COPD.  Please only use albuterol as needed  3.  For your back pain, I placed a referral for physical therapy who can help mobilize your back and strengthen your muscle.  You can take Tylenol or NSAIDs such as ibuprofen or Aleve for pain as needed.  4.  Continue taking metformin for your diabetes.  We can consider a weekly injection to help with your diabetes and weight loss at next follow-up  Please follow-up in 4 weeks so I can reassess your breathing  Dr. Cyndie Chime
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
## 2023-01-04 NOTE — Assessment & Plan Note (Addendum)
Patient with chronic dyspnea in setting of tobacco use disorder.  He smoked about 1 pack a day for more than 20 years.  Fortunately he has quit smoking a few months ago.  PFT showed minimal obstructive disease consistent with COPD.  His MRC score was 3.  Patient states that he has been using his albuterol 3 times a day and more often at night.  He reports snoring, daytime sleepiness and disrupted breathing during sleep.  He uses his albuterol inhaler for this reason.  He reports chronic wheezing, shortness of breath, intermittent cough with sputum production.  -Start LABA LAMA Anoro Ellipta for maintenance therapy -Encourage smoking cessation -Advised patient to only use albuterol as needed -Pending sleep study authorization to rule out OSA

## 2023-01-04 NOTE — Assessment & Plan Note (Signed)
Repeat A1c of 7.1 today.  He is taking metformin 1 g twice daily.  Most recent microalbuminuria was normal.  -He may benefit from GLP-1 for his diabetes and weight loss.  Will discuss at next visit due to time issue today. -Foot exam performed today

## 2023-01-04 NOTE — Assessment & Plan Note (Signed)
Patient was seen 1 month ago for chest pain with exertion and improved with rest.  He was started on low-dose aspirin and metoprolol.  Echocardiogram and cardiology referral was placed.  Today patient reports improvement of his chest pain symptoms.  He only used 1 dose of nitroglycerin 2 days after his last visit for chest pain.  He has not been taking his aspirin.  He denies any worsening chest pain with exertion.  He does have chronic dyspnea which is discussed separately.  His symptoms are consistent with typical anginal presentation.  He will need to follow-up with cardiology for a stress test.  Advised patient to start taking baby aspirin to reduce cardiovascular events.  His cardiology referral is pending.

## 2023-01-08 NOTE — Progress Notes (Signed)
Internal Medicine Clinic Attending  Case discussed with Dr. Nguyen  At the time of the visit.  We reviewed the resident's history and exam and pertinent patient test results.  I agree with the assessment, diagnosis, and plan of care documented in the resident's note. 

## 2023-01-22 ENCOUNTER — Encounter: Payer: Self-pay | Admitting: *Deleted

## 2023-01-28 ENCOUNTER — Other Ambulatory Visit: Payer: Self-pay

## 2023-01-28 DIAGNOSIS — I10 Essential (primary) hypertension: Secondary | ICD-10-CM

## 2023-01-28 MED ORDER — LISINOPRIL 20 MG PO TABS
20.0000 mg | ORAL_TABLET | Freq: Every day | ORAL | 0 refills | Status: DC
Start: 2023-01-28 — End: 2023-05-01

## 2023-01-29 ENCOUNTER — Ambulatory Visit (HOSPITAL_COMMUNITY)
Admission: RE | Admit: 2023-01-29 | Discharge: 2023-01-29 | Disposition: A | Discharge status: Home / Self Care | Payer: Medicaid Other | Source: Ambulatory Visit | Attending: Student in an Organized Health Care Education/Training Program | Admitting: Student in an Organized Health Care Education/Training Program

## 2023-01-29 DIAGNOSIS — R079 Chest pain, unspecified: Secondary | ICD-10-CM | POA: Diagnosis not present

## 2023-01-29 DIAGNOSIS — I10 Essential (primary) hypertension: Secondary | ICD-10-CM | POA: Insufficient documentation

## 2023-01-29 DIAGNOSIS — E785 Hyperlipidemia, unspecified: Secondary | ICD-10-CM | POA: Insufficient documentation

## 2023-01-29 LAB — ECHOCARDIOGRAM COMPLETE
Area-P 1/2: 3.31 cm2
Calc EF: 61.5 %
S' Lateral: 2.7 cm
Single Plane A2C EF: 62.5 %
Single Plane A4C EF: 60.3 %

## 2023-01-29 NOTE — Progress Notes (Signed)
Echocardiogram 2D Echocardiogram has been performed.  Bruce [name] 01/29/2023, 2:56 PM

## 2023-01-30 ENCOUNTER — Ambulatory Visit: Payer: Medicaid Other | Attending: Internal Medicine | Admitting: Physical Therapy

## 2023-01-30 ENCOUNTER — Encounter: Payer: Self-pay | Admitting: Physical Therapy

## 2023-01-30 ENCOUNTER — Other Ambulatory Visit: Payer: Self-pay

## 2023-01-30 DIAGNOSIS — G8929 Other chronic pain: Secondary | ICD-10-CM | POA: Insufficient documentation

## 2023-01-30 DIAGNOSIS — M5442 Lumbago with sciatica, left side: Secondary | ICD-10-CM | POA: Insufficient documentation

## 2023-01-30 DIAGNOSIS — M5441 Lumbago with sciatica, right side: Secondary | ICD-10-CM | POA: Diagnosis not present

## 2023-01-30 DIAGNOSIS — M6281 Muscle weakness (generalized): Secondary | ICD-10-CM | POA: Diagnosis present

## 2023-01-30 DIAGNOSIS — M5459 Other low back pain: Secondary | ICD-10-CM | POA: Diagnosis present

## 2023-01-30 NOTE — Therapy (Signed)
OUTPATIENT PHYSICAL THERAPY THORACOLUMBAR EVALUATION  Patient Name: Bruce [name] MRN: [id number] DOB:[birth year], 47 y.o., male Today's Date: 01/30/2023   PT End of Session - 01/30/23 1325     Visit Number 1    Authorization - Number of Visits --   1x visit   PT Start Time 1258    PT Stop Time 1327    PT Time Calculation (min) 29 min             Past Medical History:  Diagnosis Date   Anxiety disorder    At risk for obstructive sleep apnea 04/15/2018   Bipolar 1 disorder (HCC)    Depression    Hepatitis B immune 04/15/2018   HLD (hyperlipidemia)    Hypertension    Schizophrenia (HCC)    Past Surgical History:  Procedure Laterality Date   wisdom tooth removal     Patient Active Problem List   Diagnosis Date Noted   Stable angina 12/09/2022   Chronic lower back pain 12/07/2022   Pulmonary nodule 09/28/2022   Shoulder pain, right 09/14/2022   Obesity, diabetes, and hypertension syndrome (HCC) 09/14/2022   Healthcare maintenance 08/31/2022   COPD (chronic obstructive pulmonary disease) (HCC) 08/30/2022   Right leg pain 10/20/2021   IBS (irritable bowel syndrome) 05/04/2021   Unexplained night sweats 10/13/2019   Chronic headaches 10/13/2019   Dizziness 10/13/2019   Earlobe lesion, left 07/15/2019   HLD (hyperlipidemia) 03/12/2019   Abscess of apex of dental root complicating chronic inflammation 03/12/2019   Finger pain, left 01/20/2019   HTN (hypertension) 11/12/2018   Tobacco use disorder 05/20/2018   Schizophrenia (HCC) 04/15/2018   Bipolar disorder (HCC) 04/15/2018   Anxiety and depression 04/15/2018   Gastroesophageal reflux disease 04/15/2018   At risk for obstructive sleep apnea 04/15/2018    PCP: Bruce Herrlich, MD  REFERRING PROVIDER: Mercie Eon, MD  THERAPY DIAG:  Other low back pain  Muscle weakness  REFERRING DIAG: Chronic bilateral low back pain with bilateral sciatica [M54.42, M54.41, G89.29]   Rationale for Evaluation and  Treatment:  Rehabilitation  SUBJECTIVE:  PERTINENT PAST HISTORY:  Schizophrenia, bipolar,  headaches, COPD, R shoulder pain, diabetic      PRECAUTIONS: None  WEIGHT BEARING RESTRICTIONS No  FALLS:  Has patient fallen in last 6 months? Yes, Number of falls: 0  MOI/History of condition:  Onset date: >5 years  SUBJECTIVE STATEMENT  Bruce [name] is a 47 y.o. male who presents to clinic with chief complaint of chronic low back pain starting >5 years ago.  Recently the pain has gotten worse.  He has intermittent pain which can reach his knees.  He endorses some n/t in his lower leg and into his toes at times.     Red flags:  denies BB changes and saddle anesthesia  Pain:  Are you having pain? Yes Pain location: low back NPRS scale:  0/10 to 8/10 Aggravating factors: sitting, lying, or standing for long periods and then moving Relieving factors: resting Pain description: sharp and aching Stage: Chronic Stability: staying the same 24 hour pattern: worse at night   Occupation: NA  Assistive Device: NA  Hand Dominance: NA  Patient Goals/Specific Activities: reduce pain   OBJECTIVE:   DIAGNOSTIC FINDINGS:  None relevant to back  GENERAL OBSERVATION/GAIT:  NA  SENSATION:  Light touch: Deficits dorsal surface of L foot  LUMBAR AROM  AROM AROM  01/30/2023  Flexion Fingertips unable to reach mid thighs (limited by >75%)  Extension limited by 50%  Right  lateral flexion limited by 75%  Left lateral flexion limited by 75%  Right rotation limited by 25%  Left rotation limited by 25%    (Blank rows = not tested)   LE MMT:  MMT Right (Eval) Left (Eval)  Hip flexion (L2, L3) 4 3+  Knee extension (L3) 4 3+  Knee flexion    Hip abduction    Hip extension    Hip external rotation    Hip internal rotation    Hip adduction    Ankle dorsiflexion (L4) c c  Ankle plantarflexion (S1) c c  Ankle inversion    Ankle eversion    Great Toe ext (L5) c c  Grossly      (Blank rows = not tested, score listed is out of 5 possible points.  N = WNL, D = diminished, C = clear for gross weakness with myotome testing, * = concordant pain with testing)   LUMBAR SPECIAL TESTS:  Straight leg raise: L (+), R (+)  MUSCLE LENGTH: Hamstrings: Right significant restriction; Left significant restriction Bruce [name] test: Right significant restriction; Left significant restriction  LE ROM:  ROM Right (Eval) Left (Eval)  Hip flexion    Hip extension    Hip abduction    Hip adduction    Hip internal rotation    Hip external rotation    Knee flexion    Knee extension    Ankle dorsiflexion    Ankle plantarflexion    Ankle inversion    Ankle eversion      (Blank rows = not tested, N = WNL, * = concordant pain with testing)  Functional Tests  Eval (01/30/2023)    Bridge: unable to move through full arc d/t pain and weakness                                                         TODAY'S TREATMENT  Creating, reviewing, and completing below HEP  PATIENT EDUCATION:  POC, diagnosis, prognosis, HEP, and outcome measures.  Pt educated via explanation, demonstration, and handout (HEP).  Pt confirms understanding verbally.   HOME EXERCISE PROGRAM: Access Code: [email or web address] URL: [email or web address] Date: 01/30/2023 Prepared by: Bruce [name]  Exercises - Supine Lower Trunk Rotation  - 1 x daily - 7 x weekly - 1 sets - 20 reps - 3 hold - Supine Hip Adduction Isometric with Ball  - 1 x daily - 7 x weekly - 2 sets - 10 reps - 10'' hold - Hooklying Isometric Clamshell  - 1 x daily - 7 x weekly - 3 sets - 10 reps - Bridge  - 1 x daily - 7 x weekly - 3 sets - 10 reps - 5-10'' hold  Treatment priorities   Eval                                                  ASSESSMENT:  CLINICAL IMPRESSION: Bruce [name] is a 47 y.o. male who presents to clinic with signs and sxs consistent with low back pain with radicular sxs.  Pt is extremely  limited in lumbar ROM and activity d/t pain.  He does get radiating pain to his knees.  He does have intermittent  n/t below his knees in no specific pattern.  He did not realize that this appt was for his back pain.  He requests we hold of on PT until he loses weight.  I was able to suggest some basic exercises and stretches for him to try at home and let him know to seek another referral if/when he would like to return; he agrees with plan.    OBJECTIVE IMPAIRMENTS: Pain, lumbar ROM, LE and core strength  ACTIVITY LIMITATIONS: sitting, standing, sleeping, walking  PERSONAL FACTORS: See medical history and pertinent history   REHAB POTENTIAL: Fair chronic; poor health literacy; not interested in continuing PT right now  CLINICAL DECISION MAKING: Stable/uncomplicated  EVALUATION COMPLEXITY: Low   GOALS: 1x visit     PLAN: PT FREQUENCY: 1x visit  PT DURATION: 1x visit  PLANNED INTERVENTIONS: Therapeutic exercises, Aquatic therapy, Therapeutic activity, Neuro Muscular re-education, Gait training, Patient/Family education, Joint mobilization, Dry Needling, Electrical stimulation, Spinal mobilization and/or manipulation, Moist heat, Taping, Vasopneumatic device, Ionotophoresis 4mg /ml Dexamethasone, and Manual therapy   Bruce [name] PT, DPT 01/30/2023, 1:38 PM

## 2023-02-04 ENCOUNTER — Ambulatory Visit (INDEPENDENT_AMBULATORY_CARE_PROVIDER_SITE_OTHER): Payer: Medicaid Other | Admitting: Student

## 2023-02-04 VITALS — BP 135/91 | HR 76 | Temp 98.7°F | Ht 77.0 in | Wt 286.5 lb

## 2023-02-04 DIAGNOSIS — E669 Obesity, unspecified: Secondary | ICD-10-CM

## 2023-02-04 DIAGNOSIS — Z7985 Long-term (current) use of injectable non-insulin antidiabetic drugs: Secondary | ICD-10-CM

## 2023-02-04 DIAGNOSIS — E1169 Type 2 diabetes mellitus with other specified complication: Secondary | ICD-10-CM | POA: Diagnosis not present

## 2023-02-04 DIAGNOSIS — I2089 Other forms of angina pectoris: Secondary | ICD-10-CM | POA: Diagnosis not present

## 2023-02-04 DIAGNOSIS — Z7984 Long term (current) use of oral hypoglycemic drugs: Secondary | ICD-10-CM | POA: Diagnosis not present

## 2023-02-04 DIAGNOSIS — I10 Essential (primary) hypertension: Secondary | ICD-10-CM | POA: Diagnosis present

## 2023-02-04 DIAGNOSIS — I152 Hypertension secondary to endocrine disorders: Secondary | ICD-10-CM

## 2023-02-04 DIAGNOSIS — E1159 Type 2 diabetes mellitus with other circulatory complications: Secondary | ICD-10-CM | POA: Diagnosis not present

## 2023-02-04 DIAGNOSIS — J439 Emphysema, unspecified: Secondary | ICD-10-CM

## 2023-02-04 MED ORDER — UMECLIDINIUM-VILANTEROL 62.5-25 MCG/ACT IN AEPB
1.0000 | INHALATION_SPRAY | Freq: Every day | RESPIRATORY_TRACT | 2 refills | Status: AC
Start: 2023-02-04 — End: 2023-05-05

## 2023-02-04 MED ORDER — SEMAGLUTIDE(0.25 OR 0.5MG/DOS) 2 MG/3ML ~~LOC~~ SOPN
0.2500 mg | PEN_INJECTOR | SUBCUTANEOUS | 1 refills | Status: DC
Start: 2023-02-04 — End: 2023-05-14

## 2023-02-04 NOTE — Assessment & Plan Note (Signed)
A1c 7.1% last month. Currently on metformin 1g BID, tolerating well. He is interested in GLP-1a therapy for better glycemic control and added weight loss benefit. We discussed ozempic therapy as this is covered by his formulary. Reviewed how to take ozempic. Prescribed ozempic 0.25mg  weekly which he will take for 4 weeks, then can increase to 0.5mg  weekly for 4 weeks if tolerating initial dose well. He will need to come in for follow up in 2 months for repeat A1c. He has scheduled a diabetic eye exam for this upcoming January.   Plan: -continue metformin 1g BID -start ozempic 0.25mg  weekly, after 4 weeks can increase to 0.5mg  weekly if tolerating initial dose well -f/u in 2 months for repeat A1c

## 2023-02-04 NOTE — Progress Notes (Signed)
   CC: f/u HTN, T2DM  HPI:  [name] is a 47 y.o. male with history listed below presenting to the [facility] for f/u HTN, T2DM. Please see individualized problem based charting for full HPI.  Past Medical History:  Diagnosis Date   Anxiety disorder    At risk for obstructive sleep apnea 04/15/2018   Bipolar 1 disorder (HCC)    Depression    Hepatitis B immune 04/15/2018   HLD (hyperlipidemia)    Hypertension    Schizophrenia (HCC)     Review of Systems:  Negative aside from that listed in individualized problem based charting.  Physical Exam:  Vitals:   02/04/23 1320  BP: (!) 135/91  Pulse: 76  Temp: 98.7 F (37.1 C)  TempSrc: Oral  SpO2: 97%  Weight: 286 lb 8 oz (130 kg)  Height: 6\' 5"  (1.956 m)   Physical Exam Constitutional:      Appearance: Normal appearance. He is obese. He is not ill-appearing.  HENT:     Mouth/Throat:     Mouth: Mucous membranes are moist.     Pharynx: Oropharynx is clear. No oropharyngeal exudate.  Eyes:     General: No scleral icterus.    Extraocular Movements: Extraocular movements intact.     Conjunctiva/sclera: Conjunctivae normal.  Cardiovascular:     Rate and Rhythm: Normal rate and regular rhythm.     Heart sounds: Normal heart sounds. No murmur heard.    No friction rub. No gallop.  Pulmonary:     Effort: Pulmonary effort is normal.     Breath sounds: Normal breath sounds. No wheezing, rhonchi or rales.  Abdominal:     General: Bowel sounds are normal. There is no distension.     Palpations: Abdomen is soft.     Tenderness: There is no abdominal tenderness.  Musculoskeletal:        General: No swelling.  Skin:    General: Skin is warm and dry.  Neurological:     General: No focal deficit present.     Mental Status: He is alert.  Psychiatric:        Mood and Affect: Mood normal.        Behavior: Behavior normal.      Assessment & Plan:   See Encounters Tab for problem based charting.  Patient discussed with  Dr. Oswaldo Done

## 2023-02-04 NOTE — Patient Instructions (Addendum)
Bruce [name],  It was a pleasure seeing you in the clinic today.   As we discussed, I have prescribed ozempic for your diabetes and to help with weight loss. Please take 0.25mg  doses once a week. After 4 weeks, you can increase this to 0.5mg  doses once a week.  Please let us know if pen needles are not provided and I will be happy to send those to your pharmacy. I have also refilled your anoro ellipta inhaler. Please make sure to regularly take your blood pressure medications. Please come back in 2 months for your next visit to recheck your A1c level.  Please call our clinic at [phone] if you have any questions or concerns. The best time to call is Monday-Friday from 9am-4pm, but there is someone available 24/7 at the same number. If you need medication refills, please notify your pharmacy one week in advance and they will send [place] a request.   Thank you for letting us take part in your care. We look forward to seeing you next time!

## 2023-02-04 NOTE — Assessment & Plan Note (Signed)
Angina has been stable since last visit. Has not needed to take SL nitroglycerin since last visit. Has remained adherent with baby aspirin and toprol-xl. He has cardiology follow up with Dr. Flora Lipps next month.

## 2023-02-04 NOTE — Assessment & Plan Note (Signed)
Stable and well controlled on current therapy. He is on Anoro ellipta and albuterol prn. Not needing to use albuterol currently as symptoms are well controlled with LAMA/LABA therapy. Needs refill on anoro ellipta. He stopped smoking cigarettes about 1.5 years ago, which I congratulated him on.   Plan: -refilled anoro ellipta -albuterol prn

## 2023-02-04 NOTE — Assessment & Plan Note (Signed)
BP mildly elevated today at 135/91. He is on lisinopril 20mg  daily and toprol-xl 25mg  daily but has not taken these yet today. He typically takes them every morning but forgot to take them prior to going to work today. Will avoid changing his medications at this time.  Plan: -continue lisinopril 20mg  daily -continue toprol-xl 25mg  daily

## 2023-02-05 NOTE — Progress Notes (Signed)
Internal Medicine Clinic Attending  Case discussed with Dr. Jinwala  At the time of the visit.  We reviewed the resident's history and exam and pertinent patient test results.  I agree with the assessment, diagnosis, and plan of care documented in the resident's note.  

## 2023-02-18 NOTE — Progress Notes (Deleted)
Cardiology Office Note:   Date:  02/18/2023  NAME:  Bruce [name]    MRN: [id number] DOB:  1976-05-12   PCP:  Katheran James, DO  Cardiologist:  None  Electrophysiologist:  None   Referring MD: Lyndle Herrlich,*   No chief complaint on file.   History of Present Illness:   Bruce [name] is a 47 y.o. male with a hx of DM, HTN, HLD, prior tobacco abuse who is being seen today for the evaluation of chest pain at the request of Sridharan, Sriramkumar,*.  ***  Problem List DM -A1c 7.1 2. HLD -T chol 133, HDL 32, LDL 79, TG 121 3. HTN 4. COPD -minimal  Past Medical History: Past Medical History:  Diagnosis Date   Anxiety disorder    At risk for obstructive sleep apnea 04/15/2018   Bipolar 1 disorder (HCC)    Depression    Hepatitis B immune 04/15/2018   HLD (hyperlipidemia)    Hypertension    Schizophrenia (HCC)     Past Surgical History: Past Surgical History:  Procedure Laterality Date   wisdom tooth removal      Current Medications: No outpatient medications have been marked as taking for the 02/27/23 encounter (Appointment) with Sande Rives, MD.     Allergies:    Patient has no known allergies.   Social History: Social History   Socioeconomic History   Marital status: Divorced    Spouse name: Not on file   Number of children: 10   Years of education: Not on file   Highest education level: Not on file  Occupational History   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  Tobacco Use   Smoking status: Former    Packs/day: .5    Types: Cigarettes    Quit date: 05/09/2021    Years since quitting: 1.7   Smokeless tobacco: Never   Tobacco comments:    previous marijuana smoker  Vaping Use   Vaping Use: Never used  Substance and Sexual Activity   Alcohol use: Not Currently   Drug use: Not Currently    Types: Marijuana   Sexual activity: Yes    Partners: Female  Other Topics Concern   Not on file  Social History Narrative    Not on file   Social Determinants of Health   Financial Resource Strain: Not on file  Food Insecurity: No Food Insecurity (09/28/2022)   Hunger Vital Sign    Worried About Running Out of Food in the Last Year: Never true    Ran Out of Food in the Last Year: Never true  Transportation Needs: No Transportation Needs (09/28/2022)   PRAPARE - Administrator, Civil Service (Medical): No    Lack of Transportation (Non-Medical): No  Physical Activity: Not on file  Stress: Not on file  Social Connections: Socially Isolated (09/28/2022)   Social Connection and Isolation Panel [NHANES]    Frequency of Communication with Friends and Family: Once a week    Frequency of Social Gatherings with Friends and Family: Never    Attends Religious Services: Never    Database administrator or Organizations: No    Attends Engineer, structural: Never    Marital Status: Separated     Family History: The patient's family history includes Anemia in his sister; Diabetes in his maternal grandmother, mother, and sister; Glaucoma in his father and mother; Heart Problems in his daughter; Hypertension in his mother; Migraines in his sister; Prostate cancer in his  father. There is no history of Colon cancer, Stomach cancer, Esophageal cancer, or Pancreatic cancer.  ROS:   All other ROS reviewed and negative. Pertinent positives noted in the HPI.     EKGs/Labs/Other Studies Reviewed:   The following studies were personally reviewed by me today:  EKG:  EKG is *** ordered today.        TTE 01/29/2023  1. Left ventricular ejection fraction, by estimation, is 55 to 60%. The  left ventricle has normal function. The left ventricle has no regional  wall motion abnormalities. Left ventricular diastolic parameters were  normal.   2. Right ventricular systolic function is normal. The right ventricular  size is normal. There is normal pulmonary artery systolic pressure.   3. The mitral valve is normal in  structure. No evidence of mitral valve  regurgitation. No evidence of mitral stenosis.   4. The aortic valve is tricuspid. Aortic valve regurgitation is not  visualized. No aortic stenosis is present.   5. The inferior vena cava is normal in size with greater than 50%  respiratory variability, suggesting right atrial pressure of 3 mmHg.   Recent Labs: 08/30/2022: BUN 12; Creatinine, Ser 1.45; Hemoglobin 15.1; Platelets 323; Potassium 4.9; Sodium 139   Recent Lipid Panel    Component Value Date/Time   CHOL 133 09/11/2022 0847   TRIG 121 09/11/2022 0847   HDL 32 (L) 09/11/2022 0847   CHOLHDL 4.2 09/11/2022 0847   LDLCALC 79 09/11/2022 0847    Physical Exam:   VS:  There were no vitals taken for this visit.   Wt Readings from Last 3 Encounters:  02/04/23 286 lb 8 oz (130 kg)  01/04/23 277 lb 14.4 oz (126.1 kg)  12/07/22 285 lb 8 oz (129.5 kg)    General: Well nourished, well developed, in no acute distress Head: Atraumatic, normal size  Eyes: PEERLA, EOMI  Neck: Supple, no JVD Endocrine: No thryomegaly Cardiac: Normal S1, S2; RRR; no murmurs, rubs, or gallops Lungs: Clear to auscultation bilaterally, no wheezing, rhonchi or rales  Abd: Soft, nontender, no hepatomegaly  Ext: No edema, pulses 2+ Musculoskeletal: No deformities, BUE and BLE strength normal and equal Skin: Warm and dry, no rashes   Neuro: Alert and oriented to person, place, time, and situation, CNII-XII grossly intact, no focal deficits  Psych: Normal mood and affect   ASSESSMENT:   Bruce [name] is a 47 y.o. male who presents for the following: No diagnosis found.  PLAN:   There are no diagnoses linked to this encounter.  {Are you ordering a CV Procedure (e.g. stress test, cath, DCCV, TEE, etc)?   Press F2        :[id number]}  Disposition: No follow-ups on file.  Medication Adjustments/Labs and Tests Ordered: Current medicines are reviewed at length with the patient today.  Concerns regarding medicines  are outlined above.  No orders of the defined types were placed in this encounter.  No orders of the defined types were placed in this encounter.  There are no Patient Instructions on file for this visit.   Time Spent with Patient: I have spent a total of *** minutes with patient reviewing hospital notes, telemetry, EKGs, labs and examining the patient as well as establishing an assessment and plan that was discussed with the patient.  > 50% of time was spent in direct patient care.  Signed, Lenna Gilford. Flora Lipps, MD, [facility]  [facility]  [address], [address] [place], [place] [address] [phone]  02/18/2023 5:46 PM

## 2023-02-27 ENCOUNTER — Ambulatory Visit: Payer: MEDICAID | Admitting: Cardiovascular Disease

## 2023-02-27 DIAGNOSIS — R072 Precordial pain: Secondary | ICD-10-CM

## 2023-03-11 ENCOUNTER — Telehealth: Payer: Self-pay

## 2023-03-11 NOTE — Telephone Encounter (Signed)
Return pt's call - stated he needs a letter for CPS, to get his kids back. Stated he was told since he does not have a job;  it will be difficult. Stated it's difficult to work d/t his health issues. Telehealth appt scheduled tomorrow 7/23 w/Dr Ninfa Meeker.

## 2023-03-11 NOTE — Telephone Encounter (Signed)
Requesting a letter for [employer]PS. Please call pt back.

## 2023-03-12 ENCOUNTER — Ambulatory Visit (INDEPENDENT_AMBULATORY_CARE_PROVIDER_SITE_OTHER): Payer: MEDICAID | Admitting: Student

## 2023-03-12 ENCOUNTER — Encounter: Payer: Self-pay | Admitting: Student

## 2023-03-12 DIAGNOSIS — F4310 Post-traumatic stress disorder, unspecified: Secondary | ICD-10-CM | POA: Diagnosis not present

## 2023-03-12 DIAGNOSIS — R911 Solitary pulmonary nodule: Secondary | ICD-10-CM

## 2023-03-12 DIAGNOSIS — G8929 Other chronic pain: Secondary | ICD-10-CM

## 2023-03-12 DIAGNOSIS — F3170 Bipolar disorder, currently in remission, most recent episode unspecified: Secondary | ICD-10-CM

## 2023-03-12 DIAGNOSIS — M545 Low back pain, unspecified: Secondary | ICD-10-CM

## 2023-03-12 DIAGNOSIS — J449 Chronic obstructive pulmonary disease, unspecified: Secondary | ICD-10-CM | POA: Diagnosis not present

## 2023-03-12 NOTE — Assessment & Plan Note (Signed)
Pt reports struggling with this disorder since he was young. From today's conversation, I gather that it impacts his life in the form of severe social anxiety. As a result, he rarely leaves the home or goes into public life. Accordingly, he has found it difficult to tolerate a job. This is of course intertwined with the PTSD he deals with, see below. He is on multiple psychiatric medicines at the moment but he does not feel his symptoms are controlled and I agree. Will reevaluate in person for the nature of manic/depressive episodes. I do anticipate a psychiatry referral. PLAN: Evaluate in person, continue hydroxyzine 25 BID prn, continue Zoloft 150 every day, continue Remeron 15 every day at night, continue prazosin 1 mg every day at night, refer to psychiatry
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
## 2023-03-12 NOTE — Assessment & Plan Note (Signed)
PFTs in April 2024, FEV/FVC ratio is above 70. He does ascribe to daily dyspnea both with exertion and as related to what appear to be anxiety attacks. Accordingly, he is using his albuterol rescue approximately TID. He is talking Anoro ellipta as maintenance.  Cts in Jan/Feb of this year 2024 revealed emphysema. See report from January: 2. Moderate paraseptal and centrilobular emphysema with mild diffuse bronchial wall thickening, suggesting COPD. 3. Two small solid subpleural right pulmonary nodules, largest 0.3 cm, probably benign. Suggest attention on follow-up noncontrast chest CT in 12 months given the patient risk factors. 4.  Emphysema (ICD10-J43.9).  I feel this is important to reevaluate in person. Records from previous visits suggest he was better controlled earlier this year. Likely multifactorial with involvement from his mental health, possible deconditioning and cardiac. Concerned for acute exacerbation vs cardiac confounding as he has ascribed to angina and dyspnea with exertion (He should have a cardiology appt sometime this month, will confirm with him)  PLAN: in person evaluation if he can come in tomorrow. Consider ICS. Not controlled given his frequent rescue inhaler use.

## 2023-03-12 NOTE — Assessment & Plan Note (Signed)
Two small solid subpleural right pulmonary nodules, largest 0.3 cm, probably benign. Suggest attention on follow-up noncontrast chest CT in 12 months given the patient risk factors.   Repeat CT due 08/2023

## 2023-03-12 NOTE — Assessment & Plan Note (Signed)
Chronic low back pain ongoing for 5 years and worsening over time. We did not discuss this as much today as I felt that his psychiatric health was most pressing as it relates to his inability to work, but from previous notes I can see that he has had pain in b/l lumbar region with radiation to the posterior thigh, sometimes progressing to his foot. He reports pain, numbness, and tingling sensation.    Current treatment is conservative management with Tylenol/NSAIDs as needed, heat and physical therapy. Will reassess tomorrow, as he might need greater therapy.
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
## 2023-03-13 ENCOUNTER — Ambulatory Visit: Payer: MEDICAID | Admitting: Student

## 2023-03-13 VITALS — BP 134/86 | HR 89 | Temp 98.4°F | Ht 77.0 in | Wt 282.2 lb

## 2023-03-13 DIAGNOSIS — M5431 Sciatica, right side: Secondary | ICD-10-CM

## 2023-03-13 DIAGNOSIS — R519 Headache, unspecified: Secondary | ICD-10-CM

## 2023-03-13 DIAGNOSIS — F32A Depression, unspecified: Secondary | ICD-10-CM

## 2023-03-13 DIAGNOSIS — G8929 Other chronic pain: Secondary | ICD-10-CM

## 2023-03-13 DIAGNOSIS — J449 Chronic obstructive pulmonary disease, unspecified: Secondary | ICD-10-CM

## 2023-03-13 DIAGNOSIS — F419 Anxiety disorder, unspecified: Secondary | ICD-10-CM

## 2023-03-13 DIAGNOSIS — Z87891 Personal history of nicotine dependence: Secondary | ICD-10-CM

## 2023-03-13 DIAGNOSIS — M5430 Sciatica, unspecified side: Secondary | ICD-10-CM | POA: Insufficient documentation

## 2023-03-13 MED ORDER — GABAPENTIN 300 MG PO CAPS
300.0000 mg | ORAL_CAPSULE | Freq: Three times a day (TID) | ORAL | 2 refills | Status: DC
Start: 2023-03-13 — End: 2023-05-29

## 2023-03-13 NOTE — Patient Instructions (Signed)
Mr Bruce [name],  Thank you for coming in today. This after visit summary is an important review of tests, referrals, and medication changes that were discussed during your visit. If you have questions or concerns, call the clinic at [phone] between 9am - 4pm. Outside of clinic business hours, call the main hospital at [phone] and ask the operator for the on-call internal medicine resident.   Best, Dr. Katheran James

## 2023-03-13 NOTE — Assessment & Plan Note (Signed)
R neuropathic burning pain that shoots down posterior leg, affects the anterior aspect of his R foot. It occurs many times daily. Not bilateral, in control of bowels, skin unremarkable. PLAN: Gabapentin 300 TID

## 2023-03-13 NOTE — Assessment & Plan Note (Signed)
Headaches occur daily. He does not take medicine to stop them. Pattern - anterior and posterior of unclear laterality but he does report photophobia and nausea associated. Unclear if he is experiencing migraine vs tension headache. The photophobia and nausea make me very suspicious for uncontrolled migraine, he has a reported history of headaches. Unable to dig into this deeper today but I believe it is worthwhile at future visits. PLAN: Monitor for now, but consider migraine going forward.

## 2023-03-13 NOTE — Assessment & Plan Note (Addendum)
I do not believe symptoms are well controlled - particularly anxiety component. He is in touch with a psychiatric provider on [address]. However, he rarely shares openly as he is concerned that discussing his mental health will impact his ability to see his kids (he is involved with CPS right now and sees his children with supervision, further details unclear). That said, I believe much of what he struggles with is exacerbated by uncontrolled anxiety. I encouraged him to be more open with his psychiatric provider. He has anxiety attacks daily that are relieved with hydroxyzine. He denies SI/HI.  PLAN: continue current regimen Zoloft 150, Prazosin 1mg , Remeron 15, Hydroxyzine 25 prn. He follows with psychiatry, encouraged him to discuss openly with them. I believe this is the most important first step to getting him well. Will monitor going forward.

## 2023-03-13 NOTE — Assessment & Plan Note (Signed)
Pt is SOB daily and uses his albuterol rescue daily. PFTs in April 2024, FEV/FVC ratio is above 70. CT 2024 moderate emphysema. He does ascribe to daily dyspnea with exertion, though no chest pain. He is talking Anoro ellipta as maintenance.   Vitals are stable today. He is not wheezing on exam and does not have sputum production or increased work of breathing. I feel that the SOB he describes is influenced greatly by his anxiety, reporting anxiety attacks daily and significant social anxiety.  PLAN: continue albuterol and Anoro ellipta. Consider ICS if symptoms remain after anxiety is addressed.

## 2023-03-13 NOTE — Progress Notes (Unsigned)
CC: SOB, anxiety, pain, CPS letter  HPI:  Bruce [name] is a 47 y.o. male living with a history stated below and presents today for evaluation of above chronic issues. Please see problem based assessment and plan for additional details.  Past Medical History:  Diagnosis Date   Anxiety disorder    At risk for obstructive sleep apnea 04/15/2018   Bipolar 1 disorder (HCC)    Depression    Hepatitis B immune 04/15/2018   HLD (hyperlipidemia)    Hypertension    Schizophrenia (HCC)     Current Outpatient Medications on File Prior to Visit  Medication Sig Dispense Refill   acetaminophen (TYLENOL) 500 MG tablet Take 1 tablet by mouth every 6 hours as needed for pain. 30 tablet 0   albuterol (VENTOLIN HFA) 108 (90 Base) MCG/ACT inhaler Inhale 2 puffs into the lungs every 6 (six) hours as needed for wheezing or shortness of breath. 8 g 4   aspirin EC 81 MG tablet Take 1 tablet (81 mg total) by mouth daily. Swallow whole. 30 tablet 2   atorvastatin (LIPITOR) 40 MG tablet TAKE 1 TABLET (40 MG TOTAL) BY MOUTH DAILY. 90 tablet 1   dicyclomine (BENTYL) 20 MG tablet Take 1 tablet (20 mg total) by mouth every 6 (six) hours. 120 tablet 2   hydrOXYzine (ATARAX/VISTARIL) 25 MG tablet Take 25 mg by mouth 2 (two) times daily as needed.     lamoTRIgine (LAMICTAL) 100 MG tablet Take 100 mg by mouth daily.     lisinopril (ZESTRIL) 20 MG tablet Take 1 tablet (20 mg total) by mouth daily. 90 tablet 0   metFORMIN (GLUCOPHAGE) 1000 MG tablet TAKE 1 TABLET (1,000 MG TOTAL) BY MOUTH 2 (TWO) TIMES DAILY WITH A MEAL. 60 tablet 2   metoprolol succinate (TOPROL XL) 25 MG 24 hr tablet Take 1 tablet (25 mg total) by mouth daily. 30 tablet 0   mirtazapine (REMERON) 15 MG tablet Take 1 tablet (15 mg total) by mouth at bedtime. 30 tablet 0   nitroGLYCERIN (NITROSTAT) 0.4 MG SL tablet Place 1 tablet (0.4 mg total) under the tongue every 5 (five) minutes as needed for chest pain. 100 tablet 3   pantoprazole (PROTONIX)  40 MG tablet TAKE 1 TABLET (40 MG TOTAL) BY MOUTH 2 (TWO) TIMES DAILY. 90 tablet 3   polyethylene glycol powder (GLYCOLAX/MIRALAX) 17 GM/SCOOP powder Take 255 g by mouth daily. 255 g 5   prazosin (MINIPRESS) 1 MG capsule Take 1 capsule (1 mg total) by mouth at bedtime. 30 capsule 0   Semaglutide,0.25 or 0.5MG /DOS, 2 MG/3ML SOPN Inject 0.25 mg into the skin once a week. After 4 weeks, increase to 0.5mg  every week. 3 mL 1   senna (SENOKOT) 8.6 MG TABS tablet Take 1 tablet (8.6 mg total) by mouth daily as needed for mild constipation. 30 tablet 3   sertraline (ZOLOFT) 100 MG tablet Take 150 mg by mouth daily.     umeclidinium-vilanterol (ANORO ELLIPTA) 62.5-25 MCG/ACT AEPB Inhale 1 puff into the lungs daily. 30 each 2   No current facility-administered medications on file prior to visit.    Family History  Problem Relation Age of Onset   Diabetes Mother    Hypertension Mother    Glaucoma Mother    Prostate cancer Father    Glaucoma Father    Diabetes Sister    Migraines Sister    Anemia Sister    Diabetes Maternal Grandmother    Heart Problems Daughter  Colon cancer Neg Hx    Stomach cancer Neg Hx    Esophageal cancer Neg Hx    Pancreatic cancer Neg Hx     Social History   Socioeconomic History   Marital status: Divorced    Spouse name: Not on file   Number of children: 10   Years of education: Not on file   Highest education level: Not on file  Occupational History   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  Tobacco Use   Smoking status: Former    Current packs/day: 0.00    Types: Cigarettes    Quit date: 05/09/2021    Years since quitting: 1.8   Smokeless tobacco: Never   Tobacco comments:    previous marijuana smoker  Vaping Use   Vaping status: Never Used  Substance and Sexual Activity   Alcohol use: Not Currently   Drug use: Not Currently    Types: Marijuana   Sexual activity: Yes    Partners: Female  Other Topics Concern   Not on file  Social History  Narrative   Not on file   Social Determinants of Health   Financial Resource Strain: Not on file  Food Insecurity: No Food Insecurity (09/28/2022)   Hunger Vital Sign    Worried About Running Out of Food in the Last Year: Never true    Ran Out of Food in the Last Year: Never true  Transportation Needs: No Transportation Needs (09/28/2022)   PRAPARE - Administrator, Civil Service (Medical): No    Lack of Transportation (Non-Medical): No  Physical Activity: Not on file  Stress: Not on file  Social Connections: Socially Isolated (09/28/2022)   Social Connection and Isolation Panel [NHANES]    Frequency of Communication with Friends and Family: Once a week    Frequency of Social Gatherings with Friends and Family: Never    Attends Religious Services: Never    Database administrator or Organizations: No    Attends Banker Meetings: Never    Marital Status: Separated  Intimate Partner Violence: Not At Risk (09/28/2022)   Humiliation, Afraid, Rape, and Kick questionnaire    Fear of Current or Ex-Partner: No    Emotionally Abused: No    Physically Abused: No    Sexually Abused: No    Review of Systems: ROS negative except for what is noted on the assessment and plan.  Vitals:   03/13/23 0855  BP: 134/86  Pulse: 89  Temp: 98.4 F (36.9 C)  TempSrc: Oral  SpO2: 98%  Weight: 282 lb 3.2 oz (128 kg)  Height: 6\' 5"  (1.956 m)    Physical Exam: Constitutional: well-appearing man sitting in chair, in no acute distress HENT: normocephalic atraumatic, mucous membranes moist Eyes: conjunctiva non-erythematous Cardiovascular: regular rate and rhythm, no m/r/g Pulmonary/Chest: normal work of breathing on room air, lungs clear to auscultation bilaterally Abdominal: soft, non-tender, non-distended MSK: normal bulk and tone Neurological: alert & oriented x 3, no focal deficit Skin: warm and dry Psych: normal mood and behavior  Assessment & Plan:   Patient seen  with Dr. Heide Spark  Sciatica R neuropathic burning pain that shoots down posterior leg, affects the anterior aspect of his R foot. It occurs many times daily. Not bilateral, in control of bowels, skin unremarkable. PLAN: Gabapentin 300 TID  Chronic headaches Headaches occur daily. He does not take medicine to stop them. Pattern - anterior and posterior of unclear laterality but he does report photophobia and  nausea associated. Unclear if he is experiencing migraine vs tension headache. The photophobia and nausea make me very suspicious for uncontrolled migraine, he has a reported history of headaches. Unable to dig into this deeper today but I believe it is worthwhile at future visits. PLAN: Monitor for now, but consider migraine going forward.  Anxiety and depression I do not believe symptoms are well controlled - particularly anxiety component. He is in touch with a psychiatric provider on [address]. However, he rarely shares openly as he is concerned that discussing his mental health will impact his ability to see his kids (he is involved with CPS right now and sees his children with supervision, further details unclear). That said, I believe much of what he struggles with is exacerbated by uncontrolled anxiety. I encouraged him to be more open with his psychiatric provider. He has anxiety attacks daily that are relieved with hydroxyzine. He denies SI/HI.  PLAN: continue current regimen Zoloft 150, Prazosin 1mg , Remeron 15, Hydroxyzine 25 prn. He follows with psychiatry, encouraged him to discuss openly with them, will monitor going forward.  COPD (chronic obstructive pulmonary disease) (HCC) Pt is SOB daily and uses his albuterol rescue daily. PFTs in April 2024, FEV/FVC ratio is above 70. CT 2024 moderate emphysema. He does ascribe to daily dyspnea with exertion, though no chest pain. He is talking Anoro ellipta as maintenance.   Vitals are stable today. He is not wheezing on exam and  does not have sputum production or increased work of breathing. I feel that the SOB he describes is influenced greatly by his anxiety, reporting anxiety attacks daily and significant social anxiety.  PLAN: continue albuterol and Anoro ellipta. Consider ICS if symptoms remain after anxiety is addressed.  Letter to CPS detailing out-of-work status provided. Out of work until February 2025, reevaluation at that time.  Katheran James, D.O. [facility], PGY-1 Phone: [phone] Date 03/13/2023 Time 5:43 PM

## 2023-03-14 ENCOUNTER — Encounter: Payer: Self-pay | Admitting: Physical Therapy

## 2023-03-14 NOTE — Progress Notes (Signed)
Internal Medicine Clinic Attending  I was physically present during the key portions of the resident provided service and participated in the medical decision making of patient's management care. I reviewed pertinent patient test results.  The assessment, diagnosis, and plan were formulated together and I agree with the documentation in the resident's note.  Narendra, Nischal, MD  

## 2023-03-18 ENCOUNTER — Encounter: Payer: Self-pay | Admitting: Internal Medicine

## 2023-03-18 ENCOUNTER — Ambulatory Visit: Payer: MEDICAID | Admitting: Internal Medicine

## 2023-03-18 VITALS — BP 132/90 | HR 88 | Temp 98.5°F | Ht 77.0 in | Wt 282.0 lb

## 2023-03-18 DIAGNOSIS — G4733 Obstructive sleep apnea (adult) (pediatric): Secondary | ICD-10-CM | POA: Diagnosis not present

## 2023-03-18 NOTE — Patient Instructions (Signed)
Thank you, Mr.Blade Manning for allowing [place] to provide your care today. Today we discussed:  Sleep troubles I have placed a new referral for you to get a sleep study with the pulmonary clinic ([facility]) I suspect you have sleep apnea  I have ordered the following labs for you:  Lab Orders  No laboratory test(s) ordered today     Tests ordered today:  Sleep study  Referrals ordered today:   Referral Orders  No referral(s) requested today     I have ordered the following medication/changed the following medications:   Stop the following medications: There are no discontinued medications.   Start the following medications: No orders of the defined types were placed in this encounter.    Follow up: 4-6 months    Should you have any questions or concerns please call the internal medicine clinic at [phone].     Elza Rafter, D.O. [facility]

## 2023-03-18 NOTE — Progress Notes (Signed)
Internal Medicine Clinic Attending  Case discussed with the resident at the time of the visit.  We reviewed the resident's history and exam and pertinent patient test results.  I agree with the assessment, diagnosis, and plan of care documented in the resident's note.  

## 2023-03-18 NOTE — Assessment & Plan Note (Addendum)
The patient was referred to a sleep clinic back in February, as it was thought that he might have sleep apnea. This referral was denied, as it should have gone to [facility] instead.  Today, the patient notes that he continues to have episodes where he stops breathing at night/wakes up gasping for air. He also snores, wakes up and is fatigued during the day, and is obese. His STOP-BANG score is at least a 5, putting him at high risk for OSA.  I have placed a referral to [facility] for a sleep study. I have asked our front desk to assist with getting this done ASAP, as the patient has been waiting for a sleep study since February.

## 2023-03-18 NOTE — Progress Notes (Signed)
CC: sleep problems  HPI:  Bruce [name] is a 47 y.o. male living with a history stated below and presents today to discuss his sleep difficulties. Please see problem based assessment and plan for additional details.  Past Medical History:  Diagnosis Date   Anxiety disorder    At risk for obstructive sleep apnea 04/15/2018   Bipolar 1 disorder (HCC)    Depression    Hepatitis B immune 04/15/2018   HLD (hyperlipidemia)    Hypertension    Schizophrenia (HCC)     Current Outpatient Medications on File Prior to Visit  Medication Sig Dispense Refill   acetaminophen (TYLENOL) 500 MG tablet Take 1 tablet by mouth every 6 hours as needed for pain. 30 tablet 0   albuterol (VENTOLIN HFA) 108 (90 Base) MCG/ACT inhaler Inhale 2 puffs into the lungs every 6 (six) hours as needed for wheezing or shortness of breath. 8 g 4   aspirin EC 81 MG tablet Take 1 tablet (81 mg total) by mouth daily. Swallow whole. 30 tablet 2   atorvastatin (LIPITOR) 40 MG tablet TAKE 1 TABLET (40 MG TOTAL) BY MOUTH DAILY. 90 tablet 1   dicyclomine (BENTYL) 20 MG tablet Take 1 tablet (20 mg total) by mouth every 6 (six) hours. 120 tablet 2   gabapentin (NEURONTIN) 300 MG capsule Take 1 capsule (300 mg total) by mouth 3 (three) times daily. 90 capsule 2   hydrOXYzine (ATARAX/VISTARIL) 25 MG tablet Take 25 mg by mouth 2 (two) times daily as needed.     lamoTRIgine (LAMICTAL) 100 MG tablet Take 100 mg by mouth daily.     lisinopril (ZESTRIL) 20 MG tablet Take 1 tablet (20 mg total) by mouth daily. 90 tablet 0   metFORMIN (GLUCOPHAGE) 1000 MG tablet TAKE 1 TABLET (1,000 MG TOTAL) BY MOUTH 2 (TWO) TIMES DAILY WITH A MEAL. 60 tablet 2   metoprolol succinate (TOPROL XL) 25 MG 24 hr tablet Take 1 tablet (25 mg total) by mouth daily. 30 tablet 0   mirtazapine (REMERON) 15 MG tablet Take 1 tablet (15 mg total) by mouth at bedtime. 30 tablet 0   nitroGLYCERIN (NITROSTAT) 0.4 MG SL tablet Place 1 tablet (0.4 mg total) under the  tongue every 5 (five) minutes as needed for chest pain. 100 tablet 3   pantoprazole (PROTONIX) 40 MG tablet TAKE 1 TABLET (40 MG TOTAL) BY MOUTH 2 (TWO) TIMES DAILY. 90 tablet 3   polyethylene glycol powder (GLYCOLAX/MIRALAX) 17 GM/SCOOP powder Take 255 g by mouth daily. 255 g 5   prazosin (MINIPRESS) 1 MG capsule Take 1 capsule (1 mg total) by mouth at bedtime. 30 capsule 0   Semaglutide,0.25 or 0.5MG /DOS, 2 MG/3ML SOPN Inject 0.25 mg into the skin once a week. After 4 weeks, increase to 0.5mg  every week. 3 mL 1   senna (SENOKOT) 8.6 MG TABS tablet Take 1 tablet (8.6 mg total) by mouth daily as needed for mild constipation. 30 tablet 3   sertraline (ZOLOFT) 100 MG tablet Take 150 mg by mouth daily.     umeclidinium-vilanterol (ANORO ELLIPTA) 62.5-25 MCG/ACT AEPB Inhale 1 puff into the lungs daily. 30 each 2   No current facility-administered medications on file prior to visit.    Family History  Problem Relation Age of Onset   Diabetes Mother    Hypertension Mother    Glaucoma Mother    Prostate cancer Father    Glaucoma Father    Diabetes Sister    Migraines Sister  Anemia Sister    Diabetes Maternal Grandmother    Heart Problems Daughter    Colon cancer Neg Hx    Stomach cancer Neg Hx    Esophageal cancer Neg Hx    Pancreatic cancer Neg Hx     Social History   Socioeconomic History   Marital status: Divorced    Spouse name: Not on file   Number of children: 10   Years of education: Not on file   Highest education level: Not on file  Occupational History   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  Tobacco Use   Smoking status: Former    Current packs/day: 0.00    Types: Cigarettes    Quit date: 05/09/2021    Years since quitting: 1.8   Smokeless tobacco: Never   Tobacco comments:    previous marijuana smoker  Vaping Use   Vaping status: Never Used  Substance and Sexual Activity   Alcohol use: Not Currently   Drug use: Not Currently    Types: Marijuana    Sexual activity: Yes    Partners: Female  Other Topics Concern   Not on file  Social History Narrative   Not on file   Social Determinants of Health   Financial Resource Strain: Not on file  Food Insecurity: No Food Insecurity (09/28/2022)   Hunger Vital Sign    Worried About Running Out of Food in the Last Year: Never true    Ran Out of Food in the Last Year: Never true  Transportation Needs: No Transportation Needs (09/28/2022)   PRAPARE - Administrator, Civil Service (Medical): No    Lack of Transportation (Non-Medical): No  Physical Activity: Not on file  Stress: Not on file  Social Connections: Socially Isolated (09/28/2022)   Social Connection and Isolation Panel [NHANES]    Frequency of Communication with Friends and Family: Once a week    Frequency of Social Gatherings with Friends and Family: Never    Attends Religious Services: Never    Database administrator or Organizations: No    Attends Banker Meetings: Never    Marital Status: Separated  Intimate Partner Violence: Not At Risk (09/28/2022)   Humiliation, Afraid, Rape, and Kick questionnaire    Fear of Current or Ex-Partner: No    Emotionally Abused: No    Physically Abused: No    Sexually Abused: No    Review of Systems: ROS negative except for what is noted on the assessment and plan.  Vitals:   03/18/23 1335 03/18/23 1355  BP: (!) 143/84 (!) 132/90  Pulse: 87 88  Temp: 98.5 F (36.9 C)   TempSrc: Oral   SpO2: 99%   Weight: 282 lb (127.9 kg)   Height: 6\' 5"  (1.956 m)     Physical Exam: Constitutional: well-appearing obese male sitting in chair, in no acute distress Cardiovascular: regular rate and rhythm, no m/r/g Pulmonary/Chest: normal work of breathing on room air, lungs clear to auscultation bilaterally MSK: normal bulk and tone Skin: warm and dry Psych: normal mood and behavior  Assessment & Plan:    OSA (obstructive sleep apnea) The patient was referred to a sleep  clinic back in February, as it was thought that he might have sleep apnea. This referral was denied, as it should have gone to [facility] instead.  Today, the patient notes that he continues to have episodes where he stops breathing at night/wakes up gasping for air. He also snores, wakes  up and is fatigued during the day, and is obese. His STOP-BANG score is at least a 5, putting him at high risk for OSA.  I have placed a referral to [facility] for a sleep study. I have asked our front desk to assist with getting this done ASAP, as the patient has been waiting for a sleep study since February.    Elza Rafter, D.O. [facility] Internal Medicine, PGY-3 Phone: [phone] Date 03/18/2023 Time 3:10 PM

## 2023-03-20 NOTE — Progress Notes (Signed)
Internal Medicine Clinic Attending  Case discussed with the resident at the time of the visit.  We reviewed the resident's history and exam and pertinent patient test results.  I agree with the assessment, diagnosis, and plan of care documented in the resident's note.  

## 2023-03-27 ENCOUNTER — Other Ambulatory Visit: Payer: Self-pay | Admitting: Gastroenterology

## 2023-04-02 ENCOUNTER — Other Ambulatory Visit: Payer: Self-pay

## 2023-04-02 NOTE — Telephone Encounter (Signed)
Bruce [name] from [facility] called she is requesting a rx refill for Metformin.

## 2023-04-03 MED ORDER — METFORMIN HCL 1000 MG PO TABS: 1000.0000 mg | ORAL_TABLET | Freq: Two times a day (BID) | ORAL | 2 refills | Status: DC

## 2023-04-19 IMAGING — CT CT ABD-PELV W/ CM
2 of 5 series · 16 of 46 positions shown, 18 images · IV contrast (omnipaque)
Comparison: November 14, 2020

CLINICAL DATA: Abdominal pain, primarily left-sided

EXAM:
CT ABDOMEN AND PELVIS WITH CONTRAST
TECHNIQUE: Multidetector CT imaging of the abdomen and pelvis was performed
using the standard protocol following bolus administration of
intravenous contrast.
CONTRAST:  100mL OMNIPAQUE IOHEXOL 300 MG/ML  SOLN

[Series 3: a/p w/ 5mm · axial · 0.73mm/px · z∈[+836,+1251]mm · 13 of 93 slices shown, 15 images]
[im 5/93  soft-tissue]
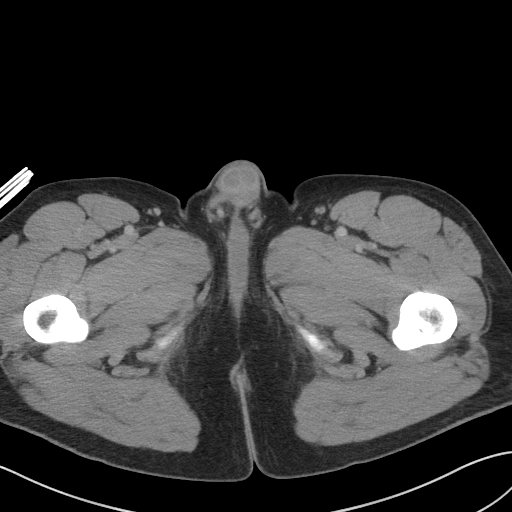
[im 5/93  bone]
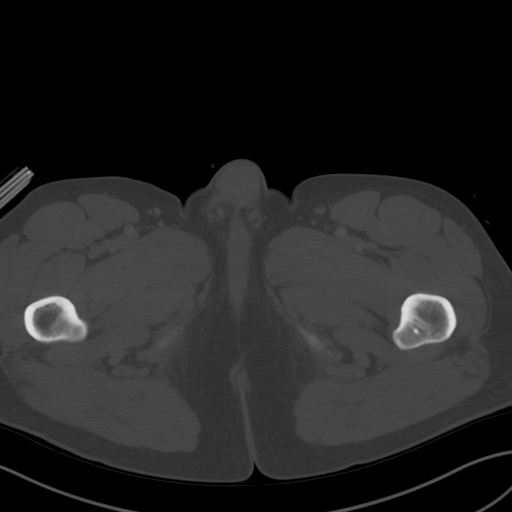
[im 14/93  soft-tissue]
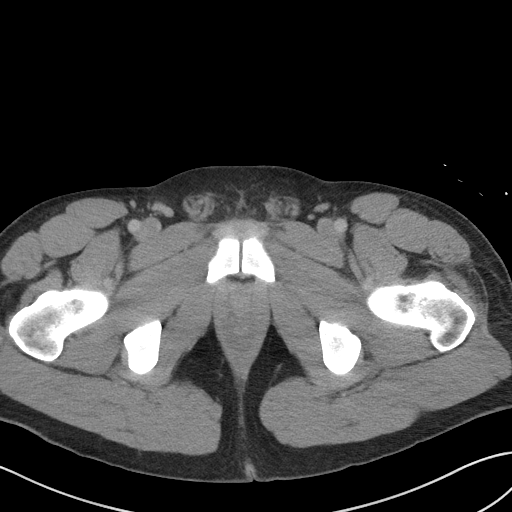
[im 19/93  soft-tissue]
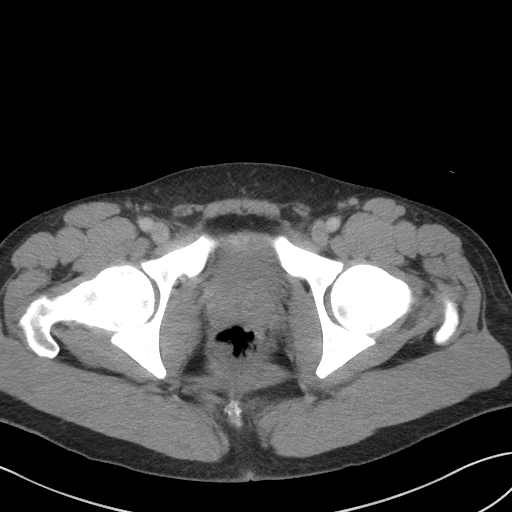
[im 28/93  soft-tissue]
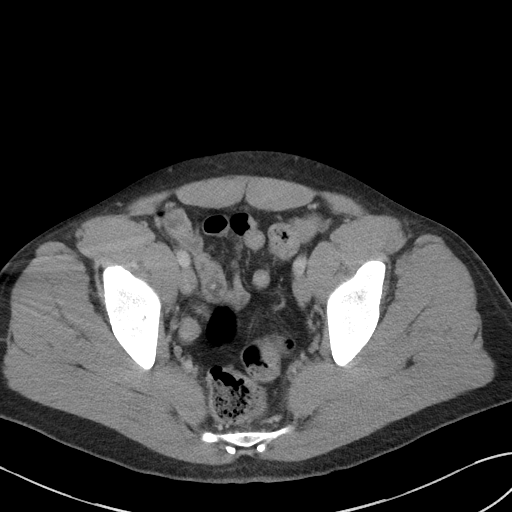
[im 33/93  soft-tissue]
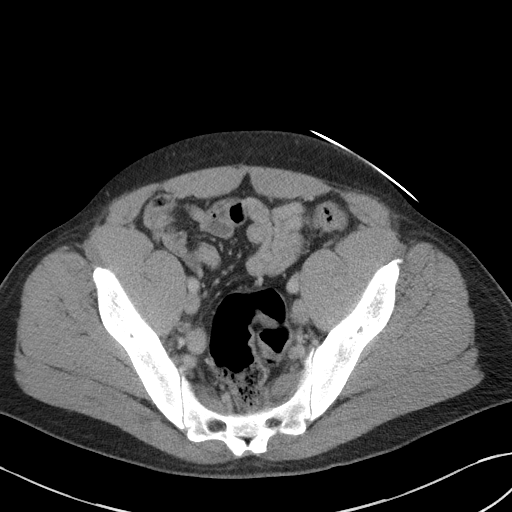
[im 42/93  soft-tissue]
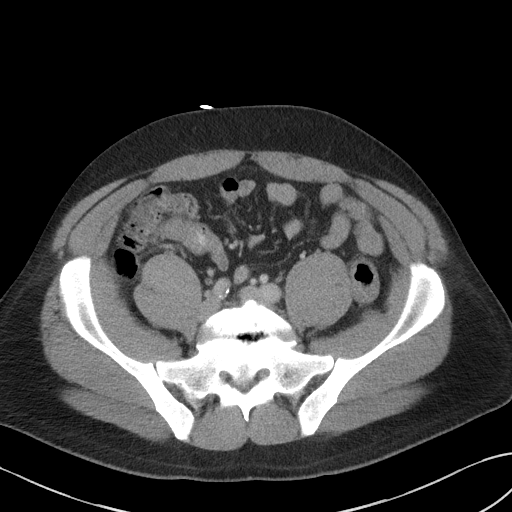
[im 47/93  soft-tissue]
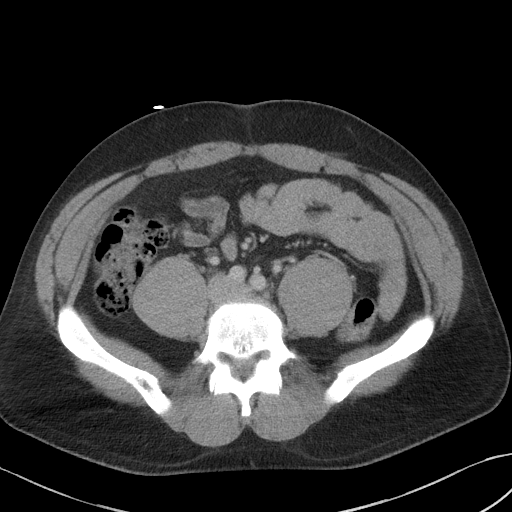
[im 51/93  soft-tissue]
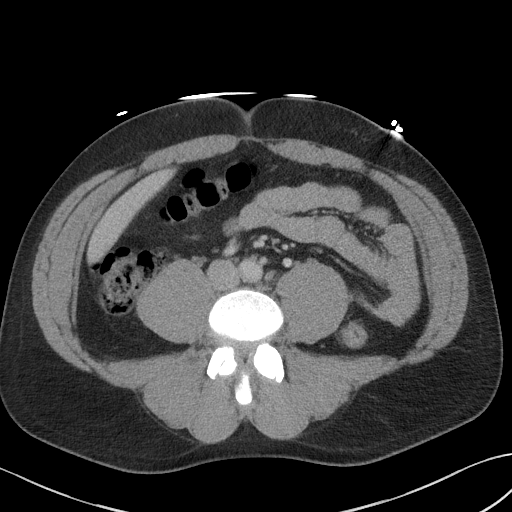
[im 60/93  soft-tissue]
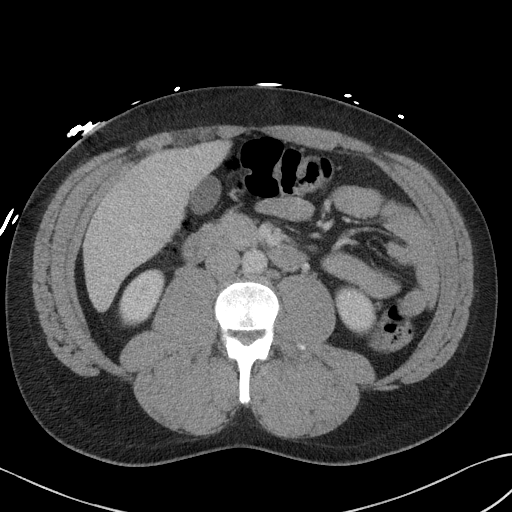
[im 60/93  bone]
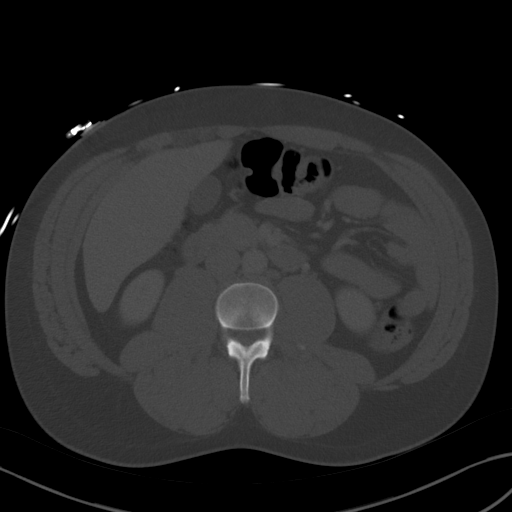
[im 65/93  soft-tissue]
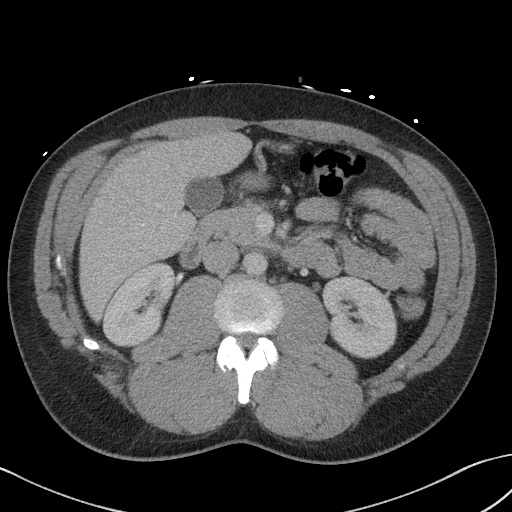
[im 74/93  soft-tissue]
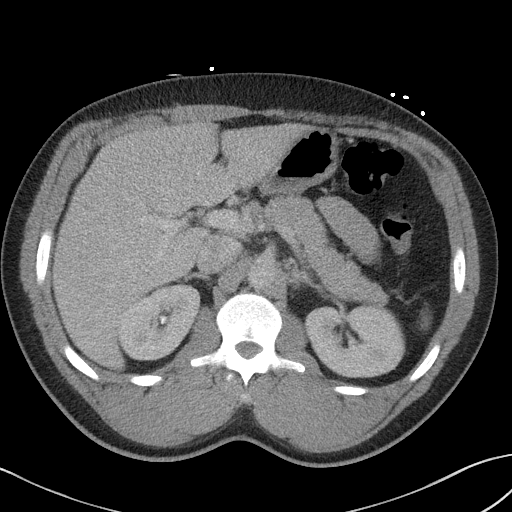
[im 79/93  soft-tissue]
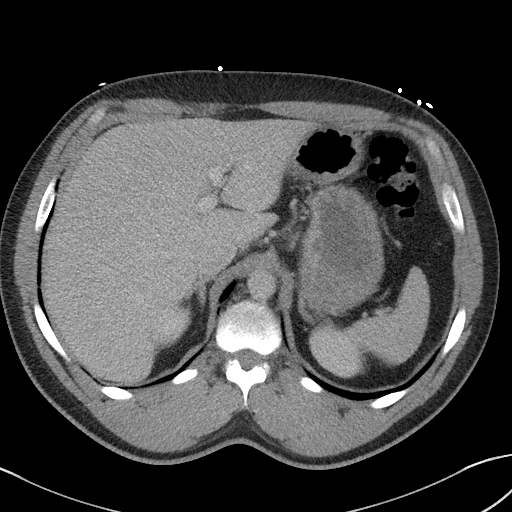
[im 88/93  soft-tissue]
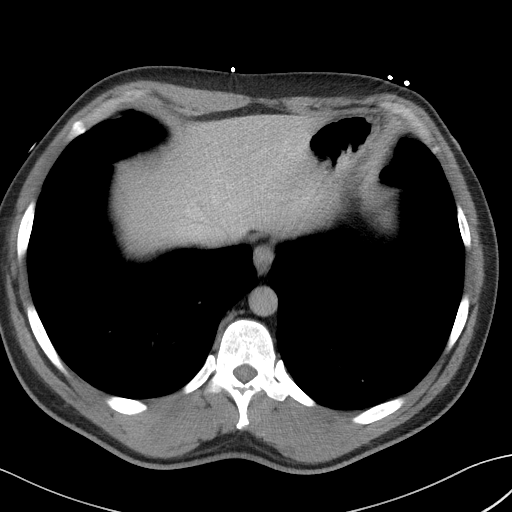

[Series 7: a/p w/ cor · coronal · 0.85mm/px · 3 of 154 slices shown]
[im 52/154  soft-tissue]
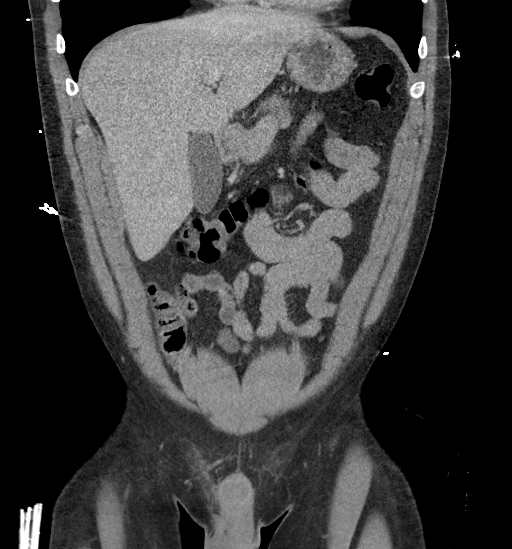
[im 69/154  soft-tissue]
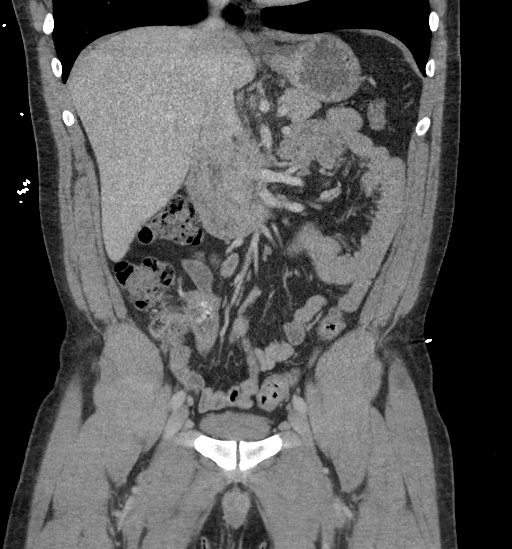
[im 86/154  soft-tissue]
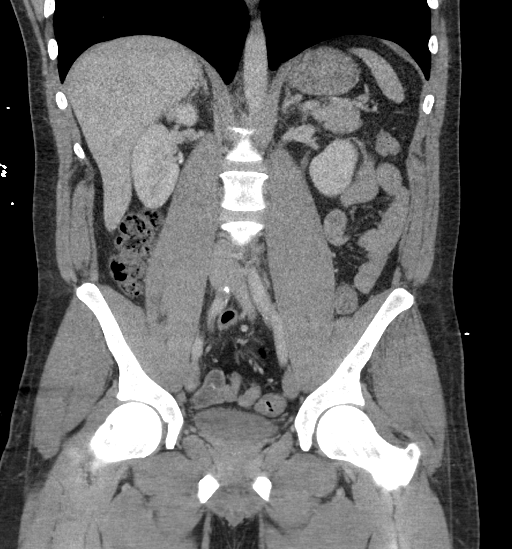

[16 of 46 positions shown; findings below may reference images not displayed]

FINDINGS: Lower chest: There is posterior left base atelectasis. No lung base
edema or consolidation.

Hepatobiliary: No focal liver lesions are appreciable. Liver
measures 19.9 cm in length. Gallbladder wall is not appreciably
thickened. There is no biliary duct dilatation.

Pancreas: There is no pancreatic mass or inflammatory focus.

Spleen: No splenic lesions are evident.

Adrenals/Urinary Tract: Adrenals bilaterally appear normal. No
evident renal mass or hydronephrosis on either side. There is no
appreciable renal or ureteral calculus on either side. The urinary
bladder is midline. There is evidence of a degree of urinary bladder
wall thickening.

Stomach/Bowel: Moderate stool noted in the colon. There is slight
thickening of the wall of the third portion of the duodenum. No
other bowel wall thickening is noted. No evident bowel obstruction.
The terminal ileum appears normal. Appendix appears normal. No free
air or portal venous air.

Vascular/Lymphatic: No evident abdominal aortic aneurysm. Mild
calcification noted in common iliac arteries. Major venous
structures appear patent. No evident adenopathy in the abdomen or
pelvis.

Reproductive: Prostate and seminal vesicles normal in size and
contour. Occasional prostatic calculi noted.

Other: Fat noted in each inguinal ring. No bowel containing hernia.
No abscess or ascites evident in the abdomen or pelvis.

Musculoskeletal: Degenerative change present at L5-S1. There is
vacuum phenomenon at this level. There is spinal stenosis at L4-5
due to disc protrusion and bony hypertrophy. Degenerative change
noted in each sacroiliac joint. No blastic or lytic bone lesions are
evident. No abdominal wall or intramuscular lesions apparent.
IMPRESSION: 1. Mild wall thickening in the third portion of the duodenum,
potentially representing a degree of post bulbar duodenitis. No
other bowel wall thickening. No bowel obstruction.

2.  No abscess in the abdomen or pelvis.  Appendix appears normal.

3. Wall thickening of the urinary bladder consistent with a degree
of cystitis.

4. No renal or ureteral calculus. No hydronephrosis on either side.

5. Spinal stenosis at L4-5 due to bony hypertrophy and disc
protrusion.

6.  Liver prominent without focal liver lesion evident.

## 2023-04-25 ENCOUNTER — Ambulatory Visit: Payer: MEDICAID | Admitting: Internal Medicine

## 2023-04-30 ENCOUNTER — Other Ambulatory Visit: Payer: Self-pay | Admitting: Gastroenterology

## 2023-05-01 ENCOUNTER — Other Ambulatory Visit: Payer: Self-pay | Admitting: *Deleted

## 2023-05-01 DIAGNOSIS — E785 Hyperlipidemia, unspecified: Secondary | ICD-10-CM

## 2023-05-01 DIAGNOSIS — I10 Essential (primary) hypertension: Secondary | ICD-10-CM

## 2023-05-01 MED ORDER — LISINOPRIL 20 MG PO TABS
20.0000 mg | ORAL_TABLET | Freq: Every day | ORAL | 0 refills | Status: DC
Start: 2023-05-01 — End: 2023-06-27

## 2023-05-01 MED ORDER — NITROGLYCERIN 0.4 MG SL SUBL: 0.4000 mg | SUBLINGUAL_TABLET | SUBLINGUAL | 3 refills | Status: DC | PRN

## 2023-05-01 MED ORDER — DICYCLOMINE HCL 20 MG PO TABS: 20.0000 mg | ORAL_TABLET | Freq: Four times a day (QID) | ORAL | 2 refills | Status: DC

## 2023-05-01 MED ORDER — ATORVASTATIN CALCIUM 40 MG PO TABS: 40.0000 mg | ORAL_TABLET | Freq: Every day | ORAL | 1 refills | Status: DC

## 2023-05-01 NOTE — Telephone Encounter (Signed)
Received a call from [facility] stating they have  received rxs by the  doctor who is not Medicaid approval. And need to re-send rxs by [facility] approved doctor. Thanks

## 2023-05-02 ENCOUNTER — Ambulatory Visit: Payer: MEDICAID | Admitting: Adult Health

## 2023-05-02 ENCOUNTER — Encounter: Payer: Self-pay | Admitting: Adult Health

## 2023-05-02 VITALS — BP 118/86 | HR 101 | Ht 76.0 in | Wt 289.0 lb

## 2023-05-02 DIAGNOSIS — R400 Somnolence: Secondary | ICD-10-CM | POA: Diagnosis not present

## 2023-05-02 DIAGNOSIS — R0683 Snoring: Secondary | ICD-10-CM

## 2023-05-02 NOTE — Patient Instructions (Signed)
Set up for home sleep study  Healthy sleep regimen.  Do not drive if sleepy  Follow up in 6 weeks to discuss results and treatment plan  

## 2023-05-02 NOTE — Progress Notes (Signed)
@Patient  ID: Bruce [name], male    DOB: [birth year], 47 y.o.   MRN: [id number]  Chief Complaint  Patient presents with   Consult    Referring provider: Reymundo Poll, MD  HPI: 47 year old male seen for sleep consult May 10, 2023 for snoring, gasping for air, witnessed apneic events and daytime sleepiness   TEST/EVENTS :   05/02/2023 Sleep Consult patient presents for sleep consult today.  Patient complains of loud snoring, gasping for air while he is sleeping, witnessed apneic events, daytime sleepiness.  Patient says he feels tired all the time.  No matter how much sleep he gets he always feels tired.  Typically goes to sleep about 8:30 PM.  Gets up at 7 AM.  Is up multiple times throughout the night.  Says he can nap 1 or 2 times throughout the day.  Does not take any sleep aids.  Does have removable dental work.  Has had no previous sleep study.  Takes in 3 sodas daily.  Has no history of congestive heart failure or stroke.  He is not on any oxygen.  20 out of 24.  Typically gets sleepy if he sits down to rest, watch TV, passenger of a car, after eating lunch. Patient does have history of depression and is on multiple sedating medications including Neurontin, hydroxyzine, Lamictal, Remeron  Medical history significant for hypertension, asthma, diabetes, hyperlipidemia, chronic allergies, chronic headaches, anxiety, depression, bipolar disorder, schizophrenia.  Social history patient is single.  He is unemployed.  Has 10 children.  Patient lives alone.  Former smoker quit in 2022.  No alcohol.  Quit marijuana in September 2023.  Family history positive for cancer  No surgical history  No Known Allergies  Immunization History  Administered Date(s) Administered   Influenza,inj,Quad PF,6+ Mos 04/15/2018, 09/14/2022   PPD Test 10/13/2019   Tdap 09/14/2022    Past Medical History:  Diagnosis Date   Anxiety disorder    At risk for obstructive sleep apnea 04/15/2018    Bipolar 1 disorder (HCC)    Depression    Hepatitis B immune 04/15/2018   HLD (hyperlipidemia)    Hypertension    Schizophrenia (HCC)     Tobacco History: Social History   Tobacco Use  Smoking Status Former   Current packs/day: 0.00   Types: Cigarettes   Quit date: 05/09/2021   Years since quitting: 1.9  Smokeless Tobacco Never  Tobacco Comments   previous marijuana smoker   Counseling given: Not Answered Tobacco comments: previous marijuana smoker   Outpatient Medications Prior to Visit  Medication Sig Dispense Refill   acetaminophen (TYLENOL) 500 MG tablet Take 1 tablet by mouth every 6 hours as needed for pain. 30 tablet 0   albuterol (VENTOLIN HFA) 108 (90 Base) MCG/ACT inhaler Inhale 2 puffs into the lungs every 6 (six) hours as needed for wheezing or shortness of breath. 8 g 4   aspirin EC 81 MG tablet Take 1 tablet (81 mg total) by mouth daily. Swallow whole. 30 tablet 2   atorvastatin (LIPITOR) 40 MG tablet Take 1 tablet (40 mg total) by mouth daily. 90 tablet 1   dicyclomine (BENTYL) 20 MG tablet Take 1 tablet (20 mg total) by mouth every 6 (six) hours. 120 tablet 2   gabapentin (NEURONTIN) 300 MG capsule Take 1 capsule (300 mg total) by mouth 3 (three) times daily. 90 capsule 2   hydrOXYzine (ATARAX/VISTARIL) 25 MG tablet Take 25 mg by mouth 2 (two) times daily as needed.  lamoTRIgine (LAMICTAL) 100 MG tablet Take 100 mg by mouth daily.     lisinopril (ZESTRIL) 20 MG tablet Take 1 tablet (20 mg total) by mouth daily. 90 tablet 0   metFORMIN (GLUCOPHAGE) 1000 MG tablet Take 1 tablet (1,000 mg total) by mouth 2 (two) times daily with a meal. 60 tablet 2   metoprolol succinate (TOPROL XL) 25 MG 24 hr tablet Take 1 tablet (25 mg total) by mouth daily. 30 tablet 0   mirtazapine (REMERON) 15 MG tablet Take 1 tablet (15 mg total) by mouth at bedtime. 30 tablet 0   nitroGLYCERIN (NITROSTAT) 0.4 MG SL tablet Place 1 tablet (0.4 mg total) under the tongue every 5 (five)  minutes as needed for chest pain. 100 tablet 3   pantoprazole (PROTONIX) 40 MG tablet TAKE 1 TABLET (40 MG TOTAL) BY MOUTH 2 (TWO) TIMES DAILY (AM+PM) 90 tablet 0   polyethylene glycol powder (GLYCOLAX/MIRALAX) 17 GM/SCOOP powder Take 255 g by mouth daily. 255 g 5   prazosin (MINIPRESS) 1 MG capsule Take 1 capsule (1 mg total) by mouth at bedtime. 30 capsule 0   Semaglutide,0.25 or 0.5MG /DOS, 2 MG/3ML SOPN Inject 0.25 mg into the skin once a week. After 4 weeks, increase to 0.5mg  every week. 3 mL 1   senna (SENOKOT) 8.6 MG TABS tablet Take 1 tablet (8.6 mg total) by mouth daily as needed for mild constipation. 30 tablet 3   sertraline (ZOLOFT) 100 MG tablet Take 150 mg by mouth daily.     umeclidinium-vilanterol (ANORO ELLIPTA) 62.5-25 MCG/ACT AEPB Inhale 1 puff into the lungs daily. 30 each 2   No facility-administered medications prior to visit.     Review of Systems:   Constitutional:   No  weight loss, night sweats,  Fevers, chills, fatigue, or  lassitude.  HEENT:   No headaches,  Difficulty swallowing,  Tooth/dental problems, or  Sore throat,                No sneezing, itching, ear ache, nasal congestion, post nasal drip,   CV:  No chest pain,  Orthopnea, PND, swelling in lower extremities, anasarca, dizziness, palpitations, syncope.   GI  No heartburn, indigestion, abdominal pain, nausea, vomiting, diarrhea, change in bowel habits, loss of appetite, bloody stools.   Resp: No shortness of breath with exertion or at rest.  No excess mucus, no productive cough,  No non-productive cough,  No coughing up of blood.  No change in color of mucus.  No wheezing.  No chest wall deformity  Skin: no rash or lesions.  GU: no dysuria, change in color of urine, no urgency or frequency.  No flank pain, no hematuria   MS:  No joint pain or swelling.  No decreased range of motion.  No back pain.    Physical Exam  BP 118/86   Pulse (!) 101   Ht 6\' 4"  (1.93 m)   Wt 289 lb (131.1 kg)   SpO2  96%   BMI 35.18 kg/m   GEN: A/Ox3; pleasant , NAD, well nourished    HEENT:  [place]/AT,   NOSE-clear, THROAT-clear, no lesions, no postnasal drip or exudate noted. Class 3 MP airway   NECK:  Supple w/ fair ROM; no JVD; normal carotid impulses w/o bruits; no thyromegaly or nodules palpated; no lymphadenopathy.    RESP  Clear  P & A; w/o, wheezes/ rales/ or rhonchi. no accessory muscle use, no dullness to percussion  CARD:  RRR, no m/r/g, no peripheral edema,  pulses intact, no cyanosis or clubbing.  GI:   Soft & nt; nml bowel sounds; no organomegaly or masses detected.   Musco: Warm bil, no deformities or joint swelling noted.   Neuro: alert, no focal deficits noted.    Skin: Warm, no lesions or rashes    Lab Results:  CBC    Component Value Date/Time   WBC 8.4 08/30/2022 1356   WBC 15.9 (H) 12/05/2021 1054   RBC 5.78 08/30/2022 1356   RBC 5.59 12/05/2021 1054   HGB 15.1 08/30/2022 1356   HCT 46.1 08/30/2022 1356   PLT 323 08/30/2022 1356   MCV 80 08/30/2022 1356   MCH 26.1 (L) 08/30/2022 1356   MCH 26.1 12/05/2021 1054   MCHC 32.8 08/30/2022 1356   MCHC 32.7 12/05/2021 1054   RDW 13.8 08/30/2022 1356   LYMPHSABS 3.4 (H) 08/30/2022 1356   MONOABS 0.7 10/03/2021 1343   EOSABS 0.1 08/30/2022 1356   BASOSABS 0.1 08/30/2022 1356    BMET    Component Value Date/Time   NA 139 08/30/2022 1356   K 4.9 08/30/2022 1356   CL 103 08/30/2022 1356   CO2 22 08/30/2022 1356   GLUCOSE 103 (H) 08/30/2022 1356   GLUCOSE 141 (H) 12/05/2021 1054   BUN 12 08/30/2022 1356   CREATININE 1.45 (H) 08/30/2022 1356   CALCIUM 9.1 08/30/2022 1356   GFRNONAA >60 12/05/2021 1054   GFRAA >60 11/16/2019 1941    BNP No results found for: "BNP"  ProBNP No results found for: "PROBNP"  Imaging: No results found.  Administration History     None          Latest Ref Rng & Units 12/17/2022    7:46 AM  PFT Results  FVC-Pre L 5.21  P  FVC-Predicted Pre % 80  P  FVC-Post L 5.53  P   FVC-Predicted Post % 85  P  Pre FEV1/FVC % % 72  P  Post FEV1/FCV % % 73  P  FEV1-Pre L 3.74  P  FEV1-Predicted Pre % 74  P  FEV1-Post L 4.05  P  DLCO uncorrected ml/min/mmHg 25.47  P  DLCO UNC% % 68  P  DLVA Predicted % 71  P  TLC L 8.49  P  TLC % Predicted % 101  P  RV % Predicted % 109  P    P Preliminary result    No results found for: "NITRICOXIDE"      Assessment & Plan:   Snoring Loud snoring restless sleep, daytime sleepiness, witnessed apneic events all concerning for underlying sleep apnea.  Patient is also on multiple sedating medications that may be contributing to his daytime sleepiness.  Will set patient up for a home sleep study.  Patient education given on sleep apnea  - discussed how weight can impact sleep and risk for sleep disordered breathing - discussed options to assist with weight loss: combination of diet modification, cardiovascular and strength training exercises   - had an extensive discussion regarding the adverse health consequences related to untreated sleep disordered breathing - specifically discussed the risks for hypertension, coronary artery disease, cardiac dysrhythmias, cerebrovascular disease, and diabetes - lifestyle modification discussed   - discussed how sleep disruption can increase risk of accidents, particularly when driving - safe driving practices were discussed   Plan  Patient Instructions  Set up for home sleep study  Healthy sleep regimen  Do not drive if sleepy Follow up in 6 weeks  to discuss results and treatment  plan .       Rubye Oaks, NP 05/02/2023

## 2023-05-02 NOTE — Assessment & Plan Note (Signed)
Loud snoring restless sleep, daytime sleepiness, witnessed apneic events all concerning for underlying sleep apnea.  Patient is also on multiple sedating medications that may be contributing to his daytime sleepiness.  Will set patient up for a home sleep study.  Patient education given on sleep apnea  - discussed how weight can impact sleep and risk for sleep disordered breathing - discussed options to assist with weight loss: combination of diet modification, cardiovascular and strength training exercises   - had an extensive discussion regarding the adverse health consequences related to untreated sleep disordered breathing - specifically discussed the risks for hypertension, coronary artery disease, cardiac dysrhythmias, cerebrovascular disease, and diabetes - lifestyle modification discussed   - discussed how sleep disruption can increase risk of accidents, particularly when driving - safe driving practices were discussed   Plan  Patient Instructions  Set up for home sleep study  Healthy sleep regimen  Do not drive if sleepy Follow up in 6 weeks  to discuss results and treatment plan .

## 2023-05-03 ENCOUNTER — Encounter (HOSPITAL_COMMUNITY): Payer: Self-pay

## 2023-05-03 ENCOUNTER — Ambulatory Visit (HOSPITAL_COMMUNITY)
Admission: EM | Admit: 2023-05-03 | Discharge: 2023-05-03 | Disposition: A | Discharge status: Home / Self Care | Payer: MEDICAID | Attending: Emergency Medicine | Admitting: Emergency Medicine

## 2023-05-03 DIAGNOSIS — S81801A Unspecified open wound, right lower leg, initial encounter: Secondary | ICD-10-CM

## 2023-05-03 MED ORDER — SILVER SULFADIAZINE 1 % EX CREA: 1.0000 | TOPICAL_CREAM | Freq: Every day | CUTANEOUS | 0 refills | Status: DC

## 2023-05-03 MED ORDER — SILVER SULFADIAZINE 1 % EX CREA
TOPICAL_CREAM | Freq: Two times a day (BID) | CUTANEOUS | Status: DC
  Administered 2023-05-03: 1 via TOPICAL

## 2023-05-03 MED ORDER — CEPHALEXIN 500 MG PO CAPS: 500.0000 mg | ORAL_CAPSULE | Freq: Two times a day (BID) | ORAL | 0 refills | Status: AC

## 2023-05-03 MED ORDER — SILVER SULFADIAZINE 1 % EX CREA
TOPICAL_CREAM | CUTANEOUS | Status: AC
  Filled 2023-05-03: qty 85

## 2023-05-03 NOTE — Discharge Instructions (Addendum)
Please start the antibiotics twice daily with food for the next 7 days to help prevent against infection.  Stop using the alcohol, hydrogen peroxide and toilet paper for wound care.  Apply the Silvadene daily to your wound and apply a nonstick dressing to keep it covered.  Follow-up with the wound care clinic or to this clinic in a week to ensure proper healing of your wound.  Seek care sooner if you develop fever, streaking, warmth, swelling, or any new concerning symptoms.
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
## 2023-05-07 ENCOUNTER — Telehealth: Payer: Self-pay | Admitting: Student

## 2023-05-07 NOTE — Telephone Encounter (Signed)
Pt requesting a call back. Pt reporting that both of his legs have been hurting and he feels dizzy when he stands up.

## 2023-05-07 NOTE — Telephone Encounter (Signed)
Return pt's call who stated he becomes dizzy when he stands for long periods of time; this has been going on x 3 weeks. Also stated his legs hurt; he went to UC on 9/13. Stated he has a wound on his leg. And he has questions about his insulin; needs a refill on Semaglutide - he does not check his BS's at home. Appt has been schedule for tomorrow 9/18 @ 1515PM w/Dr Rayvon Char which is the first available.

## 2023-05-08 ENCOUNTER — Encounter: Payer: MEDICAID | Admitting: Student

## 2023-05-13 ENCOUNTER — Ambulatory Visit: Payer: MEDICAID

## 2023-05-13 DIAGNOSIS — R400 Somnolence: Secondary | ICD-10-CM

## 2023-05-13 DIAGNOSIS — G4733 Obstructive sleep apnea (adult) (pediatric): Secondary | ICD-10-CM

## 2023-05-13 DIAGNOSIS — R0683 Snoring: Secondary | ICD-10-CM

## 2023-05-14 ENCOUNTER — Telehealth: Payer: Self-pay | Admitting: *Deleted

## 2023-05-14 ENCOUNTER — Encounter (HOSPITAL_COMMUNITY): Payer: Self-pay | Admitting: *Deleted

## 2023-05-14 ENCOUNTER — Other Ambulatory Visit: Payer: Self-pay

## 2023-05-14 ENCOUNTER — Ambulatory Visit (HOSPITAL_COMMUNITY)
Admission: EM | Admit: 2023-05-14 | Discharge: 2023-05-14 | Disposition: A | Discharge status: Home / Self Care | Payer: MEDICAID | Attending: Internal Medicine | Admitting: Internal Medicine

## 2023-05-14 DIAGNOSIS — E11628 Type 2 diabetes mellitus with other skin complications: Secondary | ICD-10-CM | POA: Diagnosis not present

## 2023-05-14 DIAGNOSIS — G4733 Obstructive sleep apnea (adult) (pediatric): Secondary | ICD-10-CM | POA: Diagnosis not present

## 2023-05-14 DIAGNOSIS — S81801D Unspecified open wound, right lower leg, subsequent encounter: Secondary | ICD-10-CM

## 2023-05-14 DIAGNOSIS — I152 Hypertension secondary to endocrine disorders: Secondary | ICD-10-CM

## 2023-05-14 MED ORDER — SULFAMETHOXAZOLE-TRIMETHOPRIM 800-160 MG PO TABS: 1.0000 | ORAL_TABLET | Freq: Two times a day (BID) | ORAL | 0 refills | Status: AC

## 2023-05-14 MED ORDER — SEMAGLUTIDE(0.25 OR 0.5MG/DOS) 2 MG/3ML ~~LOC~~ SOPN
0.5000 mg | PEN_INJECTOR | SUBCUTANEOUS | 1 refills | Status: DC
Start: 2023-05-14 — End: 2023-06-25

## 2023-05-14 MED ORDER — MUPIROCIN 2 % EX OINT: 1.0000 | TOPICAL_OINTMENT | Freq: Two times a day (BID) | CUTANEOUS | 0 refills | Status: DC

## 2023-05-14 NOTE — ED Triage Notes (Signed)
Pt seen 05/03/23 for wound on Rt lower leg. Pt took anti-bx but reports wound has not improved.
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
## 2023-05-14 NOTE — Discharge Instructions (Addendum)
Take bactrim antibiotic twice a day for 7 days. Apply mupirocin ointment to wound twice daily for 7 days. Apply aquaphor to wound bed inbetween applications of mupirocin ointment. I have placed referral to wound care. Keep wound clean, dressed with non-stick gauze and ace wrap/coban.  Schedule an appointment with PCP as needed for follow-up before you are able to get in with wound care center.  If you develop any new or worsening symptoms or if your symptoms do not start to improve, please return here or follow-up with your primary care provider. If your symptoms are severe, please go to the emergency room.

## 2023-05-14 NOTE — Telephone Encounter (Signed)
I called pt who stated he's out of Ozempic, needs a refill. Refill request has already been noted. And stated he went to UC for his leg - stated he given a medication; UC note in Epic.

## 2023-05-22 ENCOUNTER — Ambulatory Visit (INDEPENDENT_AMBULATORY_CARE_PROVIDER_SITE_OTHER): Payer: MEDICAID | Admitting: Adult Health

## 2023-05-22 ENCOUNTER — Telehealth: Payer: Self-pay

## 2023-05-22 ENCOUNTER — Encounter: Payer: Self-pay | Admitting: Adult Health

## 2023-05-22 VITALS — BP 124/80 | HR 81 | Temp 98.1°F | Ht 77.0 in | Wt 280.0 lb

## 2023-05-22 DIAGNOSIS — G4733 Obstructive sleep apnea (adult) (pediatric): Secondary | ICD-10-CM

## 2023-05-22 DIAGNOSIS — Z23 Encounter for immunization: Secondary | ICD-10-CM | POA: Diagnosis not present

## 2023-05-22 DIAGNOSIS — F3170 Bipolar disorder, currently in remission, most recent episode unspecified: Secondary | ICD-10-CM | POA: Diagnosis not present

## 2023-05-22 NOTE — Addendum Note (Signed)
Addended by: Charlott Holler on: 05/22/2023 11:54 AM   Modules accepted: Orders

## 2023-05-22 NOTE — Progress Notes (Signed)
@Patient  ID: Bruce [name], male    DOB: 03-06-1976, 47 y.o.   MRN: [id number]  Chief Complaint  Patient presents with   Follow-up    Referring provider: Katheran James, DO  HPI: 47 year old male seen for sleep consult May 02, 2023 for snoring and witnessed apnea events found to have severe sleep apnea Medical history significant for hypertension, asthma, diabetes, hyperlipidemia, chronic allergies, chronic headaches, anxiety, depression, bipolar disorder, schizophrenia.   TEST/EVENTS :  HST May 13, 2023 showed severe sleep apnea with AHI at 62.3/hour SpO2 low at 67  05/22/2023 Follow up : OSA  Patient presents for a follow-up visit.  Patient was seen last month for a sleep consult for snoring, daytime sleepiness, witnessed apneic events.  He was set up for home sleep study was done on May 13, 2023 that showed severe sleep apnea with AHI at 62.3/hour and SpO2 low at 67%.  We discussed his sleep study results in detail.  Went over treatment options including CPAP therapy.  Patient would like to proceed with CPAP therapy.    No Known Allergies  Immunization History  Administered Date(s) Administered   Influenza,inj,Quad PF,6+ Mos 04/15/2018, 09/14/2022   PPD Test 10/13/2019   Tdap 09/14/2022    Past Medical History:  Diagnosis Date   Anxiety disorder    At risk for obstructive sleep apnea 04/15/2018   Bipolar 1 disorder (HCC)    Depression    Hepatitis B immune 04/15/2018   HLD (hyperlipidemia)    Hypertension    Schizophrenia (HCC)     Tobacco History: Social History   Tobacco Use  Smoking Status Former   Current packs/day: 0.00   Types: Cigarettes   Quit date: 05/09/2021   Years since quitting: 2.0  Smokeless Tobacco Never  Tobacco Comments   previous marijuana smoker   Counseling given: Not Answered Tobacco comments: previous marijuana smoker   Outpatient Medications Prior to Visit  Medication Sig Dispense Refill   albuterol  (VENTOLIN HFA) 108 (90 Base) MCG/ACT inhaler Inhale 2 puffs into the lungs every 6 (six) hours as needed for wheezing or shortness of breath. 8 g 4   aspirin EC 81 MG tablet Take 1 tablet (81 mg total) by mouth daily. Swallow whole. 30 tablet 2   atorvastatin (LIPITOR) 40 MG tablet Take 1 tablet (40 mg total) by mouth daily. 90 tablet 1   dicyclomine (BENTYL) 20 MG tablet Take 1 tablet (20 mg total) by mouth every 6 (six) hours. 120 tablet 2   gabapentin (NEURONTIN) 300 MG capsule Take 1 capsule (300 mg total) by mouth 3 (three) times daily. 90 capsule 2   hydrOXYzine (ATARAX/VISTARIL) 25 MG tablet Take 25 mg by mouth 2 (two) times daily as needed.     lamoTRIgine (LAMICTAL) 100 MG tablet Take 100 mg by mouth daily.     lisinopril (ZESTRIL) 20 MG tablet Take 1 tablet (20 mg total) by mouth daily. 90 tablet 0   metFORMIN (GLUCOPHAGE) 1000 MG tablet Take 1 tablet (1,000 mg total) by mouth 2 (two) times daily with a meal. 60 tablet 2   metoprolol succinate (TOPROL XL) 25 MG 24 hr tablet Take 1 tablet (25 mg total) by mouth daily. 30 tablet 0   mirtazapine (REMERON) 15 MG tablet Take 1 tablet (15 mg total) by mouth at bedtime. 30 tablet 0   mupirocin ointment (BACTROBAN) 2 % Apply 1 Application topically 2 (two) times daily. 22 g 0   nitroGLYCERIN (NITROSTAT) 0.4 MG SL tablet Place 1  tablet (0.4 mg total) under the tongue every 5 (five) minutes as needed for chest pain. 100 tablet 3   pantoprazole (PROTONIX) 40 MG tablet TAKE 1 TABLET (40 MG TOTAL) BY MOUTH 2 (TWO) TIMES DAILY (AM+PM) 90 tablet 0   prazosin (MINIPRESS) 1 MG capsule Take 1 capsule (1 mg total) by mouth at bedtime. 30 capsule 0   Semaglutide,0.25 or 0.5MG /DOS, 2 MG/3ML SOPN Inject 0.5 mg into the skin once a week. After 4 weeks, increase to 0.5mg  every week. 3 mL 1   senna (SENOKOT) 8.6 MG TABS tablet Take 1 tablet (8.6 mg total) by mouth daily as needed for mild constipation. 30 tablet 3   sertraline (ZOLOFT) 100 MG tablet Take 150 mg by  mouth daily.     silver sulfADIAZINE (SILVADENE) 1 % cream Apply 1 Application topically daily. 50 g 0   No facility-administered medications prior to visit.     Review of Systems:   Constitutional:   No  weight loss, night sweats,  Fevers, chills,  +fatigue, or  lassitude.  HEENT:   No headaches,  Difficulty swallowing,  Tooth/dental problems, or  Sore throat,                No sneezing, itching, ear ache, nasal congestion, post nasal drip,   CV:  No chest pain,  Orthopnea, PND, swelling in lower extremities, anasarca, dizziness, palpitations, syncope.   GI  No heartburn, indigestion, abdominal pain, nausea, vomiting, diarrhea, change in bowel habits, loss of appetite, bloody stools.   Resp: No shortness of breath with exertion or at rest.  No excess mucus, no productive cough,  No non-productive cough,  No coughing up of blood.  No change in color of mucus.  No wheezing.  No chest wall deformity  Skin: no rash or lesions.  GU: no dysuria, change in color of urine, no urgency or frequency.  No flank pain, no hematuria   MS:  No joint pain or swelling.  No decreased range of motion.  No back pain.    Physical Exam  BP 124/80 (BP Location: Right Arm, Cuff Size: Large)   Pulse 81   Temp 98.1 F (36.7 C) (Temporal)   Ht 6\' 5"  (1.956 m)   Wt 280 lb (127 kg)   SpO2 97%   BMI 33.20 kg/m   GEN: A/Ox3; pleasant , NAD, well nourished    HEENT:  [place]/AT,   NOSE-clear, THROAT-clear, no lesions, no postnasal drip or exudate noted. Class 4 MP airway   NECK:  Supple w/ fair ROM; no JVD; normal carotid impulses w/o bruits; no thyromegaly or nodules palpated; no lymphadenopathy.    RESP  Clear  P & A; w/o, wheezes/ rales/ or rhonchi. no accessory muscle use, no dullness to percussion  CARD:  RRR, no m/r/g, no peripheral edema, pulses intact, no cyanosis or clubbing.  GI:   Soft & nt; nml bowel sounds; no organomegaly or masses detected.   Musco: Warm bil, no deformities or joint  swelling noted.   Neuro: alert, no focal deficits noted.    Skin: Warm, no lesions or rashes    Lab Results:  CBC    BNP No results found for: "BNP"  ProBNP No results found for: "PROBNP"  Imaging: No results found.  Administration History     None          Latest Ref Rng & Units 12/17/2022    7:46 AM  PFT Results  FVC-Pre L 5.21  P  FVC-Predicted Pre % 80  P  FVC-Post L 5.53  P  FVC-Predicted Post % 85  P  Pre FEV1/FVC % % 72  P  Post FEV1/FCV % % 73  P  FEV1-Pre L 3.74  P  FEV1-Predicted Pre % 74  P  FEV1-Post L 4.05  P  DLCO uncorrected ml/min/mmHg 25.47  P  DLCO UNC% % 68  P  DLVA Predicted % 71  P  TLC L 8.49  P  TLC % Predicted % 101  P  RV % Predicted % 109  P    P Preliminary result    No results found for: "NITRICOXIDE"      Assessment & Plan:   OSA (obstructive sleep apnea) Patient has severe sleep apnea.  We reviewed his sleep study results in detail.  Patient has significant symptom burden with loud snoring, restless sleep, witnessed apneic events and daytime sleepiness.  Patient will require CPAP therapy.  Will begin auto CPAP 5 to 20 cm H2O.  Patient education given on sleep apnea, CPAP usage and care.  - discussed how weight can impact sleep and risk for sleep disordered breathing - discussed options to assist with weight loss: combination of diet modification, cardiovascular and strength training exercises   - had an extensive discussion regarding the adverse health consequences related to untreated sleep disordered breathing - specifically discussed the risks for hypertension, coronary artery disease, cardiac dysrhythmias, cerebrovascular disease, and diabetes - lifestyle modification discussed   - discussed how sleep disruption can increase risk of accidents, particularly when driving - safe driving practices were discussed   Plan  Patient Instructions  Begin CPAP At bedtime, wear all night long for at least 6hr or more   Healthy sleep regimen  Do not drive if sleepy Work on healthy weight loss . Follow up in 3 months and As needed  -Friday evening virtual clinic      Morbid obesity (HCC) Healthy weight loss   Bipolar disorder (HCC) Continue follow-up with psychiatry.  Discussed potential for sedating effects of medications.  Use caution with sedating medications.  Hopefully going on CPAP will help with daytime sleepiness.    Rubye Oaks, NP 05/22/2023

## 2023-05-22 NOTE — Assessment & Plan Note (Signed)
Healthy weight loss 

## 2023-05-22 NOTE — Assessment & Plan Note (Signed)
Patient has severe sleep apnea.  We reviewed his sleep study results in detail.  Patient has significant symptom burden with loud snoring, restless sleep, witnessed apneic events and daytime sleepiness.  Patient will require CPAP therapy.  Will begin auto CPAP 5 to 20 cm H2O.  Patient education given on sleep apnea, CPAP usage and care.  - discussed how weight can impact sleep and risk for sleep disordered breathing - discussed options to assist with weight loss: combination of diet modification, cardiovascular and strength training exercises   - had an extensive discussion regarding the adverse health consequences related to untreated sleep disordered breathing - specifically discussed the risks for hypertension, coronary artery disease, cardiac dysrhythmias, cerebrovascular disease, and diabetes - lifestyle modification discussed   - discussed how sleep disruption can increase risk of accidents, particularly when driving - safe driving practices were discussed   Plan  Patient Instructions  Begin CPAP At bedtime, wear all night long for at least 6hr or more  Healthy sleep regimen  Do not drive if sleepy Work on healthy weight loss . Follow up in 3 months and As needed  -Friday evening virtual clinic

## 2023-05-22 NOTE — Patient Instructions (Signed)
Begin CPAP At bedtime, wear all night long for at least 6hr or more  Healthy sleep regimen  Do not drive if sleepy Work on healthy weight loss . Follow up in 3 months and As needed  -Friday evening virtual clinic

## 2023-05-22 NOTE — Assessment & Plan Note (Signed)
Continue follow-up with psychiatry.  Discussed potential for sedating effects of medications.  Use caution with sedating medications.  Hopefully going on CPAP will help with daytime sleepiness.

## 2023-05-22 NOTE — Telephone Encounter (Signed)
Pa for pt ( OZMEPIC 2 MG /3 ML ) came through on cover my meds .Marland Kitchen Was submitted with last  office notes and labs .... Awaiting approval or denial

## 2023-05-23 NOTE — Telephone Encounter (Signed)
Decision:Approved  Drug:Ozempic (0.25 or 0.5 MG/DOSE) 2MG /3ML Soln Pen-inj  Quantity: 9  Duration: 84  And has determined the following:  Ozempic (0.25 OR 0.5 MG/DOSE) 2MG /3ML Soln Pen-inj is approved from 05/22/2023 to 05/21/2024. All strengths of the drug are approved.  If you have any questions please contact us at [phone].  Thank you,  [facility]

## 2023-05-23 NOTE — Telephone Encounter (Signed)
DECISION :   Your request has been approved Approved.   OZEMPIC (0.25 OR 0.5 MG/DOSE) 2MG /3ML Soln Pen-inj is approved from 05/22/2023 to 05/21/2024.    All strengths of the drug are approved.       ( COPY SENT TO PHARMACY   AND PLACED TO SCAN TO CHART ALSO )

## 2023-05-26 NOTE — Progress Notes (Unsigned)
CC: ***  HPI:  Bruce [name] is a 47 y.o. male living with a history stated below and presents today for ***. Please see problem based assessment and plan for additional details.  Past Medical History:  Diagnosis Date   Anxiety disorder    At risk for obstructive sleep apnea 04/15/2018   Bipolar 1 disorder (HCC)    Depression    Hepatitis B immune 04/15/2018   HLD (hyperlipidemia)    Hypertension    Schizophrenia (HCC)     Current Outpatient Medications on File Prior to Visit  Medication Sig Dispense Refill   albuterol (VENTOLIN HFA) 108 (90 Base) MCG/ACT inhaler Inhale 2 puffs into the lungs every 6 (six) hours as needed for wheezing or shortness of breath. 8 g 4   aspirin EC 81 MG tablet Take 1 tablet (81 mg total) by mouth daily. Swallow whole. 30 tablet 2   atorvastatin (LIPITOR) 40 MG tablet Take 1 tablet (40 mg total) by mouth daily. 90 tablet 1   dicyclomine (BENTYL) 20 MG tablet Take 1 tablet (20 mg total) by mouth every 6 (six) hours. 120 tablet 2   gabapentin (NEURONTIN) 300 MG capsule Take 1 capsule (300 mg total) by mouth 3 (three) times daily. 90 capsule 2   hydrOXYzine (ATARAX/VISTARIL) 25 MG tablet Take 25 mg by mouth 2 (two) times daily as needed.     lamoTRIgine (LAMICTAL) 100 MG tablet Take 100 mg by mouth daily.     lisinopril (ZESTRIL) 20 MG tablet Take 1 tablet (20 mg total) by mouth daily. 90 tablet 0   metFORMIN (GLUCOPHAGE) 1000 MG tablet Take 1 tablet (1,000 mg total) by mouth 2 (two) times daily with a meal. 60 tablet 2   metoprolol succinate (TOPROL XL) 25 MG 24 hr tablet Take 1 tablet (25 mg total) by mouth daily. 30 tablet 0   mirtazapine (REMERON) 15 MG tablet Take 1 tablet (15 mg total) by mouth at bedtime. 30 tablet 0   mupirocin ointment (BACTROBAN) 2 % Apply 1 Application topically 2 (two) times daily. 22 g 0   nitroGLYCERIN (NITROSTAT) 0.4 MG SL tablet Place 1 tablet (0.4 mg total) under the tongue every 5 (five) minutes as needed for chest  pain. 100 tablet 3   pantoprazole (PROTONIX) 40 MG tablet TAKE 1 TABLET (40 MG TOTAL) BY MOUTH 2 (TWO) TIMES DAILY (AM+PM) 90 tablet 0   prazosin (MINIPRESS) 1 MG capsule Take 1 capsule (1 mg total) by mouth at bedtime. 30 capsule 0   Semaglutide,0.25 or 0.5MG /DOS, 2 MG/3ML SOPN Inject 0.5 mg into the skin once a week. After 4 weeks, increase to 0.5mg  every week. 3 mL 1   senna (SENOKOT) 8.6 MG TABS tablet Take 1 tablet (8.6 mg total) by mouth daily as needed for mild constipation. 30 tablet 3   sertraline (ZOLOFT) 100 MG tablet Take 150 mg by mouth daily.     silver sulfADIAZINE (SILVADENE) 1 % cream Apply 1 Application topically daily. 50 g 0   No current facility-administered medications on file prior to visit.     Review of Systems: ROS negative except for what is noted on the assessment and plan.  There were no vitals filed for this visit.  Physical Exam: Constitutional: Well appearing, not in acute distress. Cardiovascular: regular rate and rhythm, no m/r/g Pulmonary/Chest: normal work of breathing on room air, lungs clear to auscultation bilaterally Abdominal: soft, non-tender, non-distended  Assessment & Plan:  OSA (obstructive sleep apnea) The patient was referred  to a sleep clinic back in February, as it was thought that he might have sleep apnea. This referral was denied, as it should have gone to [facility] instead.   Today, the patient notes that he continues to have episodes where he stops breathing at night/wakes up gasping for air. He also snores, wakes up and is fatigued during the day, and is obese. His STOP-BANG score is at least a 5, putting him at high risk for OSA.   I have placed a referral to [facility] for a sleep study. I have asked our front desk to assist with getting this done ASAP, as the patient has been waiting for a sleep study since February.     COPD (chronic obstructive pulmonary disease) (HCC) Pt is SOB daily and uses his albuterol rescue  daily. PFTs in April 2024, FEV/FVC ratio is above 70. CT 2024 moderate emphysema. He does ascribe to daily dyspnea with exertion, though no chest pain. He is talking Anoro ellipta as maintenance.   Vitals are stable today. He is not wheezing on exam and does not have sputum production or increased work of breathing. I feel that the SOB he describes is influenced greatly by his anxiety, reporting anxiety attacks daily and significant social anxiety.   PLAN: continue albuterol and Anoro ellipta. Consider ICS if symptoms remain after anxiety is addressed.  Chronic headaches Headaches occur daily. He does not take medicine to stop them. Pattern - anterior and posterior of unclear laterality but he does report photophobia and nausea associated. Unclear if he is experiencing migraine vs tension headache. The photophobia and nausea make me very suspicious for uncontrolled migraine, he has a reported history of headaches. Unable to dig into this deeper today but I believe it is worthwhile at future visits. PLAN:  -Consider treatment for migraine.   Sciatica R neuropathic burning pain that shoots down posterior leg, affects the anterior aspect of his R foot. It occurs many times daily. Not bilateral, in control of bowels, skin unremarkable. PLAN: Gabapentin 300 TID  Patient {GC/GE:[id number]::"discussed with","seen with"} Dr. {[id number]::"NFAOZHYQ","M. Hoffman","Mullen","Narendra","Vincent","Guilloud","Lau","Machen"}  No problem-specific Assessment & Plan notes found for this encounter.   [name], MD [facility] Internal Medicine, PGY-1 Phone: [phone] Date 05/26/2023 Time 8:06 PM

## 2023-05-27 ENCOUNTER — Encounter: Payer: MEDICAID | Admitting: Student

## 2023-05-27 ENCOUNTER — Other Ambulatory Visit: Payer: Self-pay | Admitting: Student

## 2023-05-27 DIAGNOSIS — M5431 Sciatica, right side: Secondary | ICD-10-CM

## 2023-05-28 ENCOUNTER — Other Ambulatory Visit: Payer: Self-pay | Admitting: Gastroenterology

## 2023-05-28 ENCOUNTER — Encounter (HOSPITAL_BASED_OUTPATIENT_CLINIC_OR_DEPARTMENT_OTHER): Payer: MEDICAID | Attending: Internal Medicine | Admitting: Internal Medicine

## 2023-05-28 DIAGNOSIS — I10 Essential (primary) hypertension: Secondary | ICD-10-CM | POA: Insufficient documentation

## 2023-05-28 DIAGNOSIS — K589 Irritable bowel syndrome without diarrhea: Secondary | ICD-10-CM | POA: Diagnosis not present

## 2023-05-28 DIAGNOSIS — F209 Schizophrenia, unspecified: Secondary | ICD-10-CM | POA: Insufficient documentation

## 2023-05-28 DIAGNOSIS — J449 Chronic obstructive pulmonary disease, unspecified: Secondary | ICD-10-CM | POA: Insufficient documentation

## 2023-05-28 DIAGNOSIS — Z87891 Personal history of nicotine dependence: Secondary | ICD-10-CM | POA: Diagnosis not present

## 2023-05-28 DIAGNOSIS — L03115 Cellulitis of right lower limb: Secondary | ICD-10-CM | POA: Diagnosis not present

## 2023-05-28 DIAGNOSIS — F319 Bipolar disorder, unspecified: Secondary | ICD-10-CM | POA: Insufficient documentation

## 2023-05-28 DIAGNOSIS — K219 Gastro-esophageal reflux disease without esophagitis: Secondary | ICD-10-CM | POA: Insufficient documentation

## 2023-05-28 DIAGNOSIS — L97818 Non-pressure chronic ulcer of other part of right lower leg with other specified severity: Secondary | ICD-10-CM | POA: Diagnosis not present

## 2023-05-28 DIAGNOSIS — G4733 Obstructive sleep apnea (adult) (pediatric): Secondary | ICD-10-CM | POA: Insufficient documentation

## 2023-05-28 DIAGNOSIS — E11622 Type 2 diabetes mellitus with other skin ulcer: Secondary | ICD-10-CM | POA: Insufficient documentation

## 2023-05-28 DIAGNOSIS — E785 Hyperlipidemia, unspecified: Secondary | ICD-10-CM | POA: Diagnosis not present

## 2023-05-29 ENCOUNTER — Other Ambulatory Visit: Payer: Self-pay | Admitting: Gastroenterology

## 2023-05-29 ENCOUNTER — Other Ambulatory Visit: Payer: Self-pay

## 2023-05-29 MED ORDER — PRAZOSIN HCL 1 MG PO CAPS: 1.0000 mg | ORAL_CAPSULE | Freq: Every day | ORAL | 0 refills | Status: AC
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
## 2023-05-30 NOTE — Progress Notes (Signed)
[name], [name] ([id number]) [phone].pdf Page 1 of 4 Visit Report for 05/28/2023 Abuse Risk Screen Details Patient Name: Date of Service: Bruce [name] [facility] [name] 05/28/2023 8:00 A M Medical Record Number: [id number] Patient Account Number: [id number] Date of Birth/Sex: Treating RN: 1975-11-21 (47 y.o. Bruce [name] Primary Care Aadarsh Cozort: Candelaria Celeste PHER Other Clinician: Referring Adreanna Fickel: Treating Obelia Bonello/Extender: Leodis Binet, [place] Weeks in Treatment: 0 Abuse Risk Screen Items Answer ABUSE RISK SCREEN: Has anyone close to you tried to hurt or harm you recentlyo No Do you feel uncomfortable with anyone in your familyo No Has anyone forced you do things that you didnt want to doo No Electronic Signature(s) Signed: 05/30/2023 4:20:59 PM By: Brenton Grills Entered By: Brenton Grills on 05/28/2023 08:07:33 -------------------------------------------------------------------------------- Activities of Daily Living Details Patient Name: Date of Service: Bruce [name] [facility] [name] 05/28/2023 8:00 A M Medical Record Number: [id number] Patient Account Number: [id number] Date of Birth/Sex: Treating RN: [birth year] (47 y.o. Bruce [name] Primary Care Jameir Ake: Candelaria Celeste PHER Other Clinician: Referring Elhadj Girton: Treating Shuronda Santino/Extender: Leodis Binet, [place] Weeks in Treatment: 0 Activities of Daily Living Items Answer Activities of Daily Living (Please select one for each item) Drive Automobile Completely Able T Medications ake Completely Able Use T elephone Completely Able Care for Appearance Completely Able Use T oilet Completely Able Bath / Shower Completely Able Dress Self Completely Able Feed Self Completely Able Walk Completely Able Get In / Out Bed Completely Able Housework Completely Able Prepare Meals Completely Able Handle Money Completely Able Shop for Self Completely Able Electronic  Signature(s) Signed: 05/30/2023 4:20:59 PM By: Brenton Grills Entered By: Brenton Grills on 05/28/2023 08:08:00 Maryan Rued ([id number]) [id number] Page 2 of 4 -------------------------------------------------------------------------------- Education Screening Details Patient Name: Date of Service: Bruce [name] [facility] [name] 05/28/2023 8:00 A M Medical Record Number: [id number] Patient Account Number: [id number] Date of Birth/Sex: Treating RN: 04/20/1976 (47 y.o. Bruce [name] Primary Care Rashawd Laskaris: Candelaria Celeste PHER Other Clinician: Referring Bernal Luhman: Treating Kadden Osterhout/Extender: Leodis Binet, [place] Weeks in Treatment: 0 Learning Preferences/Education Level/Primary Language Highest Education Level: College or Above Preferred Language: English Cognitive Barrier Language Barrier: No Translator Needed: No Memory Deficit: No Emotional Barrier: No Cultural/Religious Beliefs Affecting Medical Care: No Physical Barrier Impaired Vision: Yes Glasses Knowledge/Comprehension Knowledge Level: Medium Comprehension Level: Medium Ability to understand written instructions: Medium Ability to understand verbal instructions: Medium Motivation Anxiety Level: Calm Cooperation: Cooperative Education Importance: Acknowledges Need Interest in Health Problems: Asks Questions Perception: Coherent Willingness to Engage in Self-Management High Activities: Readiness to Engage in Self-Management High Activities: Electronic Signature(s) Signed: 05/30/2023 4:20:59 PM By: Brenton Grills Entered By: Brenton Grills on 05/28/2023 08:08:29 -------------------------------------------------------------------------------- Fall Risk Assessment Details Patient Name: Date of Service: Bruce [name] Bruce [name] 05/28/2023 8:00 A M Medical Record Number: [id number] Patient Account Number: [id number] Date of Birth/Sex: Treating RN: [birth year] (47 y.o. Bruce [name] Primary Care Albertus Chiarelli: Candelaria Celeste PHER Other Clinician: Referring Emett Stapel: Treating Marquee Fuchs/Extender: Leodis Binet, [place] Weeks in Treatment: 0 Fall Risk Assessment Items Have you had 2 or more falls in the last 12 monthso 0 No Have you had any fall that resulted in injury in the last 12 monthso 0 No FALLS RISK SCREEN History of falling - immediate or within 3 months 0 No Secondary diagnosis (Do you have 2 or more medical diagnoseso) 0 No Ambulatory aid YUJI, WALTH ([id number]) [id number] Page 3 of 4 None/bed rest/wheelchair/nurse 0 No Crutches/cane/walker 0 No Furniture 0  No Intravenous therapy Access/Saline/Heparin Lock 0 No Gait/Transferring Normal/ bed rest/ wheelchair 0 No Weak (short steps with or without shuffle, stooped but able to lift head while walking, may seek 0 No support from furniture) Impaired (short steps with shuffle, may have difficulty arising from chair, head down, impaired 0 No balance) Mental Status Oriented to own ability 0 No Electronic Signature(s) Signed: 05/30/2023 4:20:59 PM By: Brenton Grills Entered By: Brenton Grills on 05/28/2023 08:08:36 -------------------------------------------------------------------------------- Foot Assessment Details Patient Name: Date of Service: Bruce [name] Bruce [name] 05/28/2023 8:00 A M Medical Record Number: [id number] Patient Account Number: [id number] Date of Birth/Sex: Treating RN: 24-Jul-1976 (47 y.o. Bruce [name] Primary Care Payten Hobin: Candelaria Celeste PHER Other Clinician: Referring Daleon Willinger: Treating Mozell Haber/Extender: Leodis Binet, [place] Weeks in Treatment: 0 Foot Assessment Items Site Locations + = Sensation present, - = Sensation absent, C = Callus, U = Ulcer R = Redness, W = Warmth, M = Maceration, PU = Pre-ulcerative lesion F = Fissure, S = Swelling, D = Dryness Assessment Right: Left: Other Deformity: No No Prior Foot  Ulcer: No No Prior Amputation: No No Charcot Joint: No No Ambulatory Status: Ambulatory Without Help Gait: Steady Electronic Signature(s) Signed: 05/30/2023 4:20:59 PM By: Deirdre Peer, Travius ([id number]) [phone].pdf Page 4 of 4 Entered By: Brenton Grills on 05/28/2023 08:10:15 -------------------------------------------------------------------------------- Nutrition Risk Screening Details Patient Name: Date of Service: Bruce [name] [facility] [name] 05/28/2023 8:00 A M Medical Record Number: [id number] Patient Account Number: [id number] Date of Birth/Sex: Treating RN: 01-18-1976 (47 y.o. Bruce [name] Primary Care Kinslee Dalpe: Candelaria Celeste PHER Other Clinician: Referring Montoya Watkin: Treating Rosalinda Seaman/Extender: Leodis Binet, [place] Weeks in Treatment: 0 Height (in): Weight (lbs): Body Mass Index (BMI): Nutrition Risk Screening Items Score Screening NUTRITION RISK SCREEN: I have an illness or condition that made me change the kind and/or amount of food I eat 0 No I eat fewer than two meals per day 0 No I eat few fruits and vegetables, or milk products 0 No I have three or more drinks of beer, liquor or wine almost every day 0 No I have tooth or mouth problems that make it hard for me to eat 0 No I don't always have enough money to buy the food I need 0 No I eat alone most of the time 0 No I take three or more different prescribed or over-the-counter drugs a day 0 No Without wanting to, I have lost or gained 10 pounds in the last six months 0 No I am not always physically able to shop, cook and/or feed myself 0 No Nutrition Protocols Good Risk Protocol Moderate Risk Protocol High Risk Proctocol Risk Level: Good Risk Score: 0 Electronic Signature(s) Signed: 05/30/2023 4:20:59 PM By: Brenton Grills Entered By: Brenton Grills on 05/28/2023 56:38:75
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
## 2023-06-13 NOTE — Progress Notes (Signed)
Cardiology Office Note:    Date:  06/24/2023   ID:  Bruce [name], DOB 01/03/1976, MRN [id number]  PCP:  Katheran James, DO   [facility] Providers Cardiologist:  None     Referring MD: Lyndle Herrlich,*   Chief Complaint  Patient presents with   Dizziness    Pt stated that he feels dizzy when he moves at times he has to grab something cause he blacks out and right foot swells and SOB    Shortness of Breath   Edema    History of Present Illness:    Bruce [name] is a 47 y.o. male is seen at the request of Dr Izetta Dakin  for evaluation of chest pain and dizziness. He has a history of Schizophrenia, HTN, HLD, DM type 2 and tobacco abuse. Has severe OSA. Planning to start CPAP therapy. Echo in June was normal. CT chest in Feb 2024 showed no coronary or aortic atherosclerosis.   He reports that over a year he has infrequent symptoms of chest pain. States it feels like he swallowed a marble and it gets stuck. Only lasts 3 seconds. States this has been happening less frequently. He also has been experiencing dizzy or lightheaded spells where he feels he might pass out but hasn't had true syncope. No palpitations. Quit smoking earlier this year.  Past Medical History:  Diagnosis Date   Anxiety disorder    At risk for obstructive sleep apnea 04/15/2018   Bipolar 1 disorder (HCC)    Depression    Hepatitis B immune 04/15/2018   HLD (hyperlipidemia)    Hypertension    Schizophrenia (HCC)     Past Surgical History:  Procedure Laterality Date   wisdom tooth removal      Current Medications: Current Meds  Medication Sig   albuterol (VENTOLIN HFA) 108 (90 Base) MCG/ACT inhaler Inhale 2 puffs into the lungs every 6 (six) hours as needed for wheezing or shortness of breath.   aspirin EC 81 MG tablet Take 1 tablet (81 mg total) by mouth daily. Swallow whole.   atorvastatin (LIPITOR) 40 MG tablet Take 1 tablet (40 mg total) by mouth daily.   blood glucose meter  kit and supplies Dispense based on patient and insurance preference. Use up to four times daily as directed. (FOR ICD-10 E10.9, E11.9).   dicyclomine (BENTYL) 20 MG tablet Take 1 tablet (20 mg total) by mouth every 6 (six) hours.   gabapentin (NEURONTIN) 300 MG capsule TAKE 1 CAPSULE BY MOUTH 3 (THREE) TIMES DAILY (AM+NOON+BEDTIME)   hydrOXYzine (ATARAX/VISTARIL) 25 MG tablet Take 25 mg by mouth 2 (two) times daily as needed.   lamoTRIgine (LAMICTAL) 100 MG tablet Take 100 mg by mouth daily.   lisinopril (ZESTRIL) 20 MG tablet Take 1 tablet (20 mg total) by mouth daily.   metFORMIN (GLUCOPHAGE) 1000 MG tablet TAKE 1 TABLET BY MOUTH 2 (TWO) TIMES DAILY WITH A MEAL (AM+PM)   metoprolol succinate (TOPROL XL) 25 MG 24 hr tablet Take 1 tablet (25 mg total) by mouth daily.   mirtazapine (REMERON) 15 MG tablet Take 1 tablet (15 mg total) by mouth at bedtime.   mupirocin ointment (BACTROBAN) 2 % Apply 1 Application topically 2 (two) times daily.   nitroGLYCERIN (NITROSTAT) 0.4 MG SL tablet Place 1 tablet (0.4 mg total) under the tongue every 5 (five) minutes as needed for chest pain.   pantoprazole (PROTONIX) 40 MG tablet TAKE 1 TABLET (40 MG TOTAL) BY MOUTH 2 (TWO) TIMES DAILY (AM+PM)  prazosin (MINIPRESS) 1 MG capsule Take 1 capsule (1 mg total) by mouth at bedtime.   Semaglutide,0.25 or 0.5MG /DOS, 2 MG/3ML SOPN Inject 0.5 mg into the skin once a week. After 4 weeks, increase to 0.5mg  every week.   senna (SENOKOT) 8.6 MG TABS tablet Take 1 tablet (8.6 mg total) by mouth daily as needed for mild constipation.   sertraline (ZOLOFT) 100 MG tablet Take 150 mg by mouth daily.   silver sulfADIAZINE (SILVADENE) 1 % cream Apply 1 Application topically daily.     Allergies:   Patient has no known allergies.   Social History   Socioeconomic History   Marital status: Divorced    Spouse name: Not on file   Number of children: 10   Years of education: Not on file   Highest education level: Not on file   Occupational History   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  Tobacco Use   Smoking status: Former    Current packs/day: 0.00    Types: Cigarettes    Quit date: 05/09/2021    Years since quitting: 2.1   Smokeless tobacco: Never   Tobacco comments:    previous marijuana smoker  Vaping Use   Vaping status: Never Used  Substance and Sexual Activity   Alcohol use: Not Currently   Drug use: Not Currently    Types: Marijuana   Sexual activity: Yes    Partners: Female  Other Topics Concern   Not on file  Social History Narrative   Not on file   Social Determinants of Health   Financial Resource Strain: Not on file  Food Insecurity: No Food Insecurity (09/28/2022)   Hunger Vital Sign    Worried About Running Out of Food in the Last Year: Never true    Ran Out of Food in the Last Year: Never true  Transportation Needs: No Transportation Needs (09/28/2022)   PRAPARE - Administrator, Civil Service (Medical): No    Lack of Transportation (Non-Medical): No  Physical Activity: Not on file  Stress: Not on file  Social Connections: Socially Isolated (09/28/2022)   Social Connection and Isolation Panel [NHANES]    Frequency of Communication with Friends and Family: Once a week    Frequency of Social Gatherings with Friends and Family: Never    Attends Religious Services: Never    Diplomatic Services operational officer: No    Attends Engineer, structural: Never    Marital Status: Separated     Family History: The patient's family history includes Anemia in his sister; Diabetes in his maternal grandmother, mother, and sister; Glaucoma in his father and mother; Heart Problems in his daughter; Hypertension in his mother; Migraines in his sister; Prostate cancer in his father. There is no history of Colon cancer, Stomach cancer, Esophageal cancer, or Pancreatic cancer.  ROS:   Please see the history of present illness.     All other systems reviewed and are  negative.  EKGs/Labs/Other Studies Reviewed:    The following studies were reviewed today: Echo 01/29/23: IMPRESSIONS     1. Left ventricular ejection fraction, by estimation, is 55 to 60%. The  left ventricle has normal function. The left ventricle has no regional  wall motion abnormalities. Left ventricular diastolic parameters were  normal.   2. Right ventricular systolic function is normal. The right ventricular  size is normal. There is normal pulmonary artery systolic pressure.   3. The mitral valve is normal in structure. No  evidence of mitral valve  regurgitation. No evidence of mitral stenosis.   4. The aortic valve is tricuspid. Aortic valve regurgitation is not  visualized. No aortic stenosis is present.   5. The inferior vena cava is normal in size with greater than 50%  respiratory variability, suggesting right atrial pressure of 3 mmHg.  EKG Interpretation Date/Time:  Monday June 24 2023 15:58:32 EST Ventricular Rate:  72 PR Interval:  220 QRS Duration:  82 QT Interval:  382 QTC Calculation: 418 R Axis:   76  Text Interpretation: Sinus rhythm with 1st degree A-V block When compared with ECG of 07-Dec-2022 11:01, No significant change was found Confirmed by [place], Stoney Karczewski [phone]) on 06/24/2023 4:18:18 PM    Recent Labs: 08/30/2022: Hemoglobin 15.1; Platelets 323 06/17/2023: BUN 11; Creatinine, Ser 1.35; Potassium 4.7; Sodium 135  Recent Lipid Panel    Component Value Date/Time   CHOL 133 09/11/2022 0847   TRIG 121 09/11/2022 0847   HDL 32 (L) 09/11/2022 0847   CHOLHDL 4.2 09/11/2022 0847   LDLCALC 79 09/11/2022 0847     Risk Assessment/Calculations:                Physical Exam:    VS:  BP 120/60 (BP Location: Right Arm, Patient Position: Sitting, Cuff Size: Large)   Pulse 72   Ht 6\' 5"  (1.956 m)   Wt 281 lb (127.5 kg)   SpO2 94%   BMI 33.32 kg/m     Wt Readings from Last 3 Encounters:  06/24/23 281 lb (127.5 kg)  06/17/23 284 lb 8 oz (129  kg)  05/22/23 280 lb (127 kg)     GEN:  Well nourished, overweight in no acute distress HEENT: Normal NECK: No JVD; No carotid bruits LYMPHATICS: No lymphadenopathy CARDIAC: RRR, no murmurs, rubs, gallops RESPIRATORY:  Clear to auscultation without rales, wheezing or rhonchi  ABDOMEN: Soft, non-tender, non-distended MUSCULOSKELETAL:  No edema; No deformity  SKIN: Warm and dry NEUROLOGIC:  Alert and oriented x 3 PSYCHIATRIC:  Normal affect   ASSESSMENT:    1. Chest pain, unspecified type   2. Atypical chest pain   3. Dizziness   4. Primary hypertension   5. Hyperlipidemia, unspecified hyperlipidemia type    PLAN:    In order of problems listed above:  Atypical chest pain. While he does have multiple CV risk factors I feel his symptoms are not cardiac related based on history. The fact that he has no coronary calcification indicates low CV risk. No further ischemic work up indicated. Continue to focus on risk factor modification Dizziness. Recommend Zio patch monitor for 2 weeks to rule out any arrhythmia. If OK can follow up with PCP OSA starting CPAP HTN controlled DM per PCP Former smoker. Encouraged continued cessation. HLD on high dose statin            Medication Adjustments/Labs and Tests Ordered: Current medicines are reviewed at length with the patient today.  Concerns regarding medicines are outlined above.  Orders Placed This Encounter  Procedures   EKG 12-Lead   No orders of the defined types were placed in this encounter.   There are no Patient Instructions on file for this visit.   Signed, Dashanique Brownstein [place], MD  06/24/2023 4:23 PM    [facility]

## 2023-06-14 ENCOUNTER — Ambulatory Visit: Payer: MEDICAID | Admitting: Adult Health

## 2023-06-17 ENCOUNTER — Other Ambulatory Visit: Payer: Self-pay

## 2023-06-17 ENCOUNTER — Ambulatory Visit (INDEPENDENT_AMBULATORY_CARE_PROVIDER_SITE_OTHER): Payer: MEDICAID | Admitting: Student

## 2023-06-17 ENCOUNTER — Encounter: Payer: Self-pay | Admitting: Student

## 2023-06-17 VITALS — BP 126/85 | HR 83 | Temp 98.1°F | Ht 76.0 in | Wt 284.5 lb

## 2023-06-17 DIAGNOSIS — E1159 Type 2 diabetes mellitus with other circulatory complications: Secondary | ICD-10-CM

## 2023-06-17 DIAGNOSIS — E1169 Type 2 diabetes mellitus with other specified complication: Secondary | ICD-10-CM

## 2023-06-17 DIAGNOSIS — E669 Obesity, unspecified: Secondary | ICD-10-CM | POA: Diagnosis not present

## 2023-06-17 DIAGNOSIS — I152 Hypertension secondary to endocrine disorders: Secondary | ICD-10-CM | POA: Diagnosis not present

## 2023-06-17 DIAGNOSIS — Z6834 Body mass index (BMI) 34.0-34.9, adult: Secondary | ICD-10-CM

## 2023-06-17 DIAGNOSIS — I10 Essential (primary) hypertension: Secondary | ICD-10-CM

## 2023-06-17 DIAGNOSIS — Z7984 Long term (current) use of oral hypoglycemic drugs: Secondary | ICD-10-CM

## 2023-06-17 DIAGNOSIS — Z7985 Long-term (current) use of injectable non-insulin antidiabetic drugs: Secondary | ICD-10-CM

## 2023-06-17 LAB — POCT GLYCOSYLATED HEMOGLOBIN (HGB A1C): Hemoglobin A1C: 6.4 % — AB (ref 4.0–5.6)

## 2023-06-17 LAB — GLUCOSE, CAPILLARY: Glucose-Capillary: 99 mg/dL (ref 70–99)

## 2023-06-17 MED ORDER — BLOOD GLUCOSE METER KIT: PACK | 0 refills | Status: AC

## 2023-06-17 NOTE — Patient Instructions (Signed)
Thank you so much for coming to the clinic today!   I have sent in a blood glucose monitor for you to check your blood sugar whenever you're feeling down, as well as in the morning. We will also check some labs today. When you come back in a month, please bring in your blood sugar values so we can go over them.   If you have any questions please feel free to the call the clinic at anytime at [phone]. It was a pleasure seeing you!  Best, Dr. Thomasene Ripple

## 2023-06-18 ENCOUNTER — Encounter: Payer: MEDICAID | Admitting: Adult Health

## 2023-06-18 LAB — BMP8+ANION GAP
Anion Gap: 16 mmol/L (ref 10.0–18.0)
BUN/Creatinine Ratio: 8 — ABNORMAL LOW (ref 9–20)
BUN: 11 mg/dL (ref 6–24)
CO2: 17 mmol/L — ABNORMAL LOW (ref 20–29)
Calcium: 9.1 mg/dL (ref 8.7–10.2)
Chloride: 102 mmol/L (ref 96–106)
Creatinine, Ser: 1.35 mg/dL — ABNORMAL HIGH (ref 0.76–1.27)
Glucose: 103 mg/dL — ABNORMAL HIGH (ref 70–99)
Potassium: 4.7 mmol/L (ref 3.5–5.2)
Sodium: 135 mmol/L (ref 134–144)
eGFR: 66 mL/min/{1.73_m2} (ref >59)

## 2023-06-18 NOTE — Assessment & Plan Note (Signed)
Patient presents for A1c follow-up.  His A1c has improved to 6.4 from 7.1.  His current regimen is Ozempic 0.25 mg, and metformin 1000 mg twice a day.  He is compliant with this regimen, however does state that at times he feels diaphoretic and dizzy, then other times he endorses polyuria and polydipsia.  Seem like this has been a chronic issue for him as it has been going on for a while.  He does not check his blood sugar at home.  Overall, he endorses symptoms of hypoglycemia at times and also hyperglycemia.  Currently do not have objective data to confirm or deny this.  At the same time, his A1c has significantly from May 2024.  Will hold off on increasing or titrating his medications at this time.  Plan: - Order placed for glucometer kit and instructed patient to write down his sugar and bring at his next appointment in a month - The meanwhile, we will continue Ozempic 0.25 mg and metformin 1000 mg twice a day - Will also check BMP to rule out electrolyte abnormality as cause of his symptoms

## 2023-06-18 NOTE — Progress Notes (Signed)
CC: Diabetes follow-up  HPI:  [name] is a 47 y.o. male living with a history stated below and presents today for diabetes follow-up. Please see problem based assessment and plan for additional details.  Past Medical History:  Diagnosis Date   Anxiety disorder    At risk for obstructive sleep apnea 04/15/2018   Bipolar 1 disorder (HCC)    Depression    Hepatitis B immune 04/15/2018   HLD (hyperlipidemia)    Hypertension    Schizophrenia (HCC)     Current Outpatient Medications on File Prior to Visit  Medication Sig Dispense Refill   albuterol (VENTOLIN HFA) 108 (90 Base) MCG/ACT inhaler Inhale 2 puffs into the lungs every 6 (six) hours as needed for wheezing or shortness of breath. 8 g 4   aspirin EC 81 MG tablet Take 1 tablet (81 mg total) by mouth daily. Swallow whole. 30 tablet 2   atorvastatin (LIPITOR) 40 MG tablet Take 1 tablet (40 mg total) by mouth daily. 90 tablet 1   dicyclomine (BENTYL) 20 MG tablet Take 1 tablet (20 mg total) by mouth every 6 (six) hours. 120 tablet 2   gabapentin (NEURONTIN) 300 MG capsule TAKE 1 CAPSULE BY MOUTH 3 (THREE) TIMES DAILY (AM+NOON+BEDTIME) 90 capsule 2   hydrOXYzine (ATARAX/VISTARIL) 25 MG tablet Take 25 mg by mouth 2 (two) times daily as needed.     lamoTRIgine (LAMICTAL) 100 MG tablet Take 100 mg by mouth daily.     lisinopril (ZESTRIL) 20 MG tablet Take 1 tablet (20 mg total) by mouth daily. 90 tablet 0   metFORMIN (GLUCOPHAGE) 1000 MG tablet TAKE 1 TABLET BY MOUTH 2 (TWO) TIMES DAILY WITH A MEAL (AM+PM) 60 tablet 2   metoprolol succinate (TOPROL XL) 25 MG 24 hr tablet Take 1 tablet (25 mg total) by mouth daily. 30 tablet 0   mirtazapine (REMERON) 15 MG tablet Take 1 tablet (15 mg total) by mouth at bedtime. 30 tablet 0   mupirocin ointment (BACTROBAN) 2 % Apply 1 Application topically 2 (two) times daily. 22 g 0   nitroGLYCERIN (NITROSTAT) 0.4 MG SL tablet Place 1 tablet (0.4 mg total) under the tongue every 5 (five) minutes  as needed for chest pain. 100 tablet 3   pantoprazole (PROTONIX) 40 MG tablet TAKE 1 TABLET (40 MG TOTAL) BY MOUTH 2 (TWO) TIMES DAILY (AM+PM) 90 tablet 0   prazosin (MINIPRESS) 1 MG capsule Take 1 capsule (1 mg total) by mouth at bedtime. 30 capsule 0   Semaglutide,0.25 or 0.5MG /DOS, 2 MG/3ML SOPN Inject 0.5 mg into the skin once a week. After 4 weeks, increase to 0.5mg  every week. 3 mL 1   senna (SENOKOT) 8.6 MG TABS tablet Take 1 tablet (8.6 mg total) by mouth daily as needed for mild constipation. 30 tablet 3   sertraline (ZOLOFT) 100 MG tablet Take 150 mg by mouth daily.     silver sulfADIAZINE (SILVADENE) 1 % cream Apply 1 Application topically daily. 50 g 0   No current facility-administered medications on file prior to visit.    Family History  Problem Relation Age of Onset   Diabetes Mother    Hypertension Mother    Glaucoma Mother    Prostate cancer Father    Glaucoma Father    Diabetes Sister    Migraines Sister    Anemia Sister    Diabetes Maternal Grandmother    Heart Problems Daughter    Colon cancer Neg Hx    Stomach cancer Neg Hx  Esophageal cancer Neg Hx    Pancreatic cancer Neg Hx     Social History   Socioeconomic History   Marital status: Divorced    Spouse name: Not on file   Number of children: 10   Years of education: Not on file   Highest education level: Not on file  Occupational History   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  Tobacco Use   Smoking status: Former    Current packs/day: 0.00    Types: Cigarettes    Quit date: 05/09/2021    Years since quitting: 2.1   Smokeless tobacco: Never   Tobacco comments:    previous marijuana smoker  Vaping Use   Vaping status: Never Used  Substance and Sexual Activity   Alcohol use: Not Currently   Drug use: Not Currently    Types: Marijuana   Sexual activity: Yes    Partners: Female  Other Topics Concern   Not on file  Social History Narrative   Not on file   Social Determinants of  Health   Financial Resource Strain: Not on file  Food Insecurity: No Food Insecurity (09/28/2022)   Hunger Vital Sign    Worried About Running Out of Food in the Last Year: Never true    Ran Out of Food in the Last Year: Never true  Transportation Needs: No Transportation Needs (09/28/2022)   PRAPARE - Administrator, Civil Service (Medical): No    Lack of Transportation (Non-Medical): No  Physical Activity: Not on file  Stress: Not on file  Social Connections: Socially Isolated (09/28/2022)   Social Connection and Isolation Panel [NHANES]    Frequency of Communication with Friends and Family: Once a week    Frequency of Social Gatherings with Friends and Family: Never    Attends Religious Services: Never    Database administrator or Organizations: No    Attends Banker Meetings: Never    Marital Status: Separated  Intimate Partner Violence: Not At Risk (09/28/2022)   Humiliation, Afraid, Rape, and Kick questionnaire    Fear of Current or Ex-Partner: No    Emotionally Abused: No    Physically Abused: No    Sexually Abused: No    Review of Systems: ROS negative except for what is noted on the assessment and plan.  Vitals:   06/17/23 1347  BP: 126/85  Pulse: 83  Temp: 98.1 F (36.7 C)  TempSrc: Oral  SpO2: 99%  Weight: 284 lb 8 oz (129 kg)  Height: 6\' 4"  (1.93 m)    Physical Exam: Constitutional: well-appearing male in no acute distress HENT: normocephalic atraumatic, mucous membranes moist Eyes: conjunctiva non-erythematous Neck: supple Cardiovascular: regular rate and rhythm, no m/r/g Pulmonary/Chest: normal work of breathing on room air, lungs clear to auscultation bilaterally Abdominal: soft, non-tender, non-distended   Assessment & Plan:   Obesity, diabetes, and hypertension syndrome (HCC) Patient presents for A1c follow-up.  His A1c has improved to 6.4 from 7.1.  His current regimen is Ozempic 0.25 mg, and metformin 1000 mg twice a day.  He  is compliant with this regimen, however does state that at times he feels diaphoretic and dizzy, then other times he endorses polyuria and polydipsia.  Seem like this has been a chronic issue for him as it has been going on for a while.  He does not check his blood sugar at home.  Overall, he endorses symptoms of hypoglycemia at times and also hyperglycemia.  Currently do  not have objective data to confirm or deny this.  At the same time, his A1c has significantly from May 2024.  Will hold off on increasing or titrating his medications at this time.  Plan: - Order placed for glucometer kit and instructed patient to write down his sugar and bring at his next appointment in a month - The meanwhile, we will continue Ozempic 0.25 mg and metformin 1000 mg twice a day - Will also check BMP to rule out electrolyte abnormality as cause of his symptoms  Patient discussed with Dr. Dierdre Forth Jaydin Boniface, M.D. [facility] Internal Medicine, PGY-2 Pager: [phone] Date 06/18/2023 Time 1:49 PM

## 2023-06-18 NOTE — Progress Notes (Signed)
Internal Medicine Clinic Attending  Case discussed with the resident at the time of the visit.  We reviewed the resident's history and exam and pertinent patient test results.  I agree with the assessment, diagnosis, and plan of care documented in the resident's note.    Today we addressed patient's hypertension, diabetes, and obesity.

## 2023-06-18 NOTE — Addendum Note (Signed)
Addended by: Derrek Monaco on: 06/18/2023 02:48 PM   Modules accepted: Level of Service

## 2023-06-24 ENCOUNTER — Encounter: Payer: Self-pay | Admitting: Cardiology

## 2023-06-24 ENCOUNTER — Ambulatory Visit: Payer: MEDICAID

## 2023-06-24 ENCOUNTER — Other Ambulatory Visit: Payer: Self-pay | Admitting: Cardiology

## 2023-06-24 ENCOUNTER — Ambulatory Visit: Payer: MEDICAID | Attending: Cardiovascular Disease | Admitting: Cardiology

## 2023-06-24 VITALS — BP 120/60 | HR 72 | Ht 77.0 in | Wt 281.0 lb

## 2023-06-24 DIAGNOSIS — R079 Chest pain, unspecified: Secondary | ICD-10-CM | POA: Diagnosis not present

## 2023-06-24 DIAGNOSIS — R42 Dizziness and giddiness: Secondary | ICD-10-CM

## 2023-06-24 DIAGNOSIS — R0789 Other chest pain: Secondary | ICD-10-CM

## 2023-06-24 DIAGNOSIS — I10 Essential (primary) hypertension: Secondary | ICD-10-CM

## 2023-06-24 DIAGNOSIS — E785 Hyperlipidemia, unspecified: Secondary | ICD-10-CM

## 2023-06-24 NOTE — Patient Instructions (Signed)
Medication Instructions:  No changes  Continue taking medications  *If you need a refill on your cardiac medications before your next appointment, please call your pharmacy*   Lab Work: None   Testing/Procedures: Christena Deem- Long Term Monitor Instructions  Your physician has requested you wear a ZIO patch monitor for 14 days.  This is a single patch monitor. [employer] supplies one patch monitor per enrollment. Additional stickers are not available. Please do not apply patch if you will be having a Nuclear Stress Test,  Echocardiogram, Cardiac CT, MRI, or Chest Xray during the period you would be wearing the  monitor. The patch cannot be worn during these tests. You cannot remove and re-apply the  ZIO XT patch monitor.  Your ZIO patch monitor will be mailed 3 day USPS to your address on file. It may take 3-5 days  to receive your monitor after you have been enrolled.  Once you have received your monitor, please review the enclosed instructions. Your monitor  has already been registered assigning a specific monitor serial # to you.  Billing and Patient Assistance Program Information  We have supplied [employer] with any of your insurance information on file for billing purposes. [employer] offers a sliding scale Patient Assistance Program for patients that do not have  insurance, or whose insurance does not completely cover the cost of the ZIO monitor.  You must apply for the Patient Assistance Program to qualify for this discounted rate.  To apply, please call [employer] at [phone], select option 4, select option 2, ask to apply for  Patient Assistance Program. Meredeth Ide will ask your household income, and how many people  are in your household. They will quote your out-of-pocket cost based on that information.  [employer] will also be able to set up a 40-month, interest-free payment plan if needed.  Applying the monitor   Shave hair from upper left chest.  Hold abrader disc by orange tab. Rub  abrader in 40 strokes over the upper left chest as  indicated in your monitor instructions.  Clean area with 4 enclosed alcohol pads. Let dry.  Apply patch as indicated in monitor instructions. Patch will be placed under collarbone on left  side of chest with arrow pointing upward.  Rub patch adhesive wings for 2 minutes. Remove white label marked "1". Remove the white  label marked "2". Rub patch adhesive wings for 2 additional minutes.  While looking in a mirror, press and release button in center of patch. A small green light will  flash 3-4 times. This will be your only indicator that the monitor has been turned on.  Do not shower for the first 24 hours. You may shower after the first 24 hours.  Press the button if you feel a symptom. You will hear a small click. Record Date, Time and  Symptom in the Patient Logbook.  When you are ready to remove the patch, follow instructions on the last 2 pages of Patient  Logbook. Stick patch monitor onto the last page of Patient Logbook.  Place Patient Logbook in the blue and white box. Use locking tab on box and tape box closed  securely. The blue and white box has prepaid postage on it. Please place it in the mailbox as  soon as possible. Your physician should have your test results approximately 7 days after the  monitor has been mailed back to [facility].  Call [facility] Customer Care at [phone] if you have questions regarding  your ZIO XT patch  monitor. Call them immediately if you see an orange light blinking on your  monitor.  If your monitor falls off in less than 4 days, contact our Monitor department at [phone].  If your monitor becomes loose or falls off after 4 days call [employer] at [phone] for  suggestions on securing your monitor    Follow-Up: At [facility], you and your health needs are our priority.  As part of our continuing mission to provide you with exceptional heart care, we have created  designated Provider Care Teams.  These Care Teams include your primary Cardiologist (physician) and Advanced Practice Providers (APPs -  Physician Assistants and Nurse Practitioners) who all work together to provide you with the care you need, when you need it.    Your next appointment:   If monitor results are normal follow up with PCP   Provider:   Dr.Jordan

## 2023-06-24 NOTE — Progress Notes (Unsigned)
Enrolled for [employer] to mail a ZIO XT long term holter monitor to the patients address on file.  

## 2023-06-25 ENCOUNTER — Other Ambulatory Visit: Payer: Self-pay | Admitting: Student

## 2023-06-25 DIAGNOSIS — E669 Obesity, unspecified: Secondary | ICD-10-CM

## 2023-06-26 ENCOUNTER — Other Ambulatory Visit: Payer: Self-pay | Admitting: Student

## 2023-06-26 ENCOUNTER — Other Ambulatory Visit: Payer: Self-pay | Admitting: Gastroenterology

## 2023-06-26 DIAGNOSIS — I10 Essential (primary) hypertension: Secondary | ICD-10-CM

## 2023-07-05 ENCOUNTER — Telehealth: Payer: Self-pay | Admitting: Gastroenterology

## 2023-07-05 ENCOUNTER — Other Ambulatory Visit: Payer: Self-pay | Admitting: Gastroenterology

## 2023-07-05 ENCOUNTER — Other Ambulatory Visit: Payer: Self-pay

## 2023-07-05 MED ORDER — PANTOPRAZOLE SODIUM 40 MG PO TBEC: 40.0000 mg | DELAYED_RELEASE_TABLET | Freq: Two times a day (BID) | ORAL | 0 refills | Status: DC

## 2023-07-05 NOTE — Telephone Encounter (Signed)
Refill sent to patient pharmacy.

## 2023-07-05 NOTE — Telephone Encounter (Signed)
Inbound call from patient requesting a refill for pantoprazole medication. Patient has been scheduled for 09/24/23. Please advise, thank you.

## 2023-07-15 LAB — HM DIABETES EYE EXAM

## 2023-07-22 ENCOUNTER — Encounter: Payer: MEDICAID | Admitting: Dietician

## 2023-07-22 ENCOUNTER — Ambulatory Visit: Payer: MEDICAID | Admitting: Student

## 2023-07-22 VITALS — BP 116/66 | HR 84 | Temp 98.4°F | Ht 77.0 in | Wt 288.9 lb

## 2023-07-22 DIAGNOSIS — F419 Anxiety disorder, unspecified: Secondary | ICD-10-CM

## 2023-07-22 DIAGNOSIS — I152 Hypertension secondary to endocrine disorders: Secondary | ICD-10-CM

## 2023-07-22 DIAGNOSIS — E1169 Type 2 diabetes mellitus with other specified complication: Secondary | ICD-10-CM | POA: Diagnosis not present

## 2023-07-22 DIAGNOSIS — E669 Obesity, unspecified: Secondary | ICD-10-CM

## 2023-07-22 DIAGNOSIS — F32A Depression, unspecified: Secondary | ICD-10-CM

## 2023-07-22 DIAGNOSIS — Z6834 Body mass index (BMI) 34.0-34.9, adult: Secondary | ICD-10-CM

## 2023-07-22 DIAGNOSIS — K219 Gastro-esophageal reflux disease without esophagitis: Secondary | ICD-10-CM | POA: Diagnosis not present

## 2023-07-22 DIAGNOSIS — Z0000 Encounter for general adult medical examination without abnormal findings: Secondary | ICD-10-CM

## 2023-07-22 DIAGNOSIS — Z7985 Long-term (current) use of injectable non-insulin antidiabetic drugs: Secondary | ICD-10-CM

## 2023-07-22 DIAGNOSIS — R911 Solitary pulmonary nodule: Secondary | ICD-10-CM

## 2023-07-22 DIAGNOSIS — E1159 Type 2 diabetes mellitus with other circulatory complications: Secondary | ICD-10-CM | POA: Diagnosis not present

## 2023-07-22 DIAGNOSIS — Z7984 Long term (current) use of oral hypoglycemic drugs: Secondary | ICD-10-CM

## 2023-07-22 MED ORDER — FAMOTIDINE 20 MG PO TABS: 20.0000 mg | ORAL_TABLET | Freq: Two times a day (BID) | ORAL | 3 refills | Status: DC

## 2023-07-22 NOTE — Assessment & Plan Note (Addendum)
Chief complaint of reflux.  Notes heartburn most days, newly worsened stench with his belches, and the sensation of food getting stuck in the upper esophagus after he swallows.  No emesis, no regurgitation, no stomach pain.  Does not feel that this is worsened by his Ozempic.  Possible dysphagia is a concerning change.  He has been on pantoprazole 40 twice daily for some time.  He is established with a gastroenterologist but I do not believe has been there in about 2 years. - Return to gastroenterologist, he has an appointment in February, he will ask if he can be moved up, might need a scope - Continue pantoprazole 40 twice daily.  Will start Pepcid 20 twice daily. -Discussed elevating the head and chest when sleeping, avoiding food before bed

## 2023-07-22 NOTE — Assessment & Plan Note (Signed)
Was seen one month ago, A1c 6.4 good response to metformin 1000 BID and Ozempic 0.25mg  weekly. Brought back today per concern of hypoglycemia with instructions to check sugars at home. Bruce [name] was not able to check his sugars, but notes that his symptoms, dizziness and sweats, have improved. He does have a significant anxiety burden that may be contributing. Today, not actively concerned for hypoglycemia. I have asked him to periodically check home sugars in the morning. -Continue metformin 1000mg  twice daily and Ozempic 0.25mg  weekly

## 2023-07-22 NOTE — Patient Instructions (Signed)
Mr Rausch,  Please call your gastroenterolosist - ask if your appointment can be moved forward. Be sure to mention that your reflux is getting worse, your belches are smelling worse, and you feel that food is getting stuck after you swallow.  For the time being, will give you Pepcid to help with acid. Remember to allow at least two hours before bed where you do not eat. Elevate the bed if possible.

## 2023-07-22 NOTE — Assessment & Plan Note (Signed)
He has a significant disease burden from his anxiety, depression, possible bipolar and schizophrenia as well.  He is in touch with a psychiatrist who he sees every 3 months and a counselor whom he speaks to twice monthly.  These help him a good bit.  Do feel that he is grossly stable this time. -Continue Zoloft 150, prazosin 1 mg, Remeron 15, hydroxyzine 25 as needed per psychiatry

## 2023-07-22 NOTE — Assessment & Plan Note (Signed)
Screening colonoscopy order placed. No reported family history of colon cancer. Mother had polyps.

## 2023-07-22 NOTE — Assessment & Plan Note (Addendum)
Repeat non CT due 08/2023   Will order today to take place early 2025.  ADD: CT result as follows: Stable small right upper lobe pulmonary nodules, the largest 3 mm. These could be followed with repeat study in 1 year to ensure 2 years stability. Consider repeat CT in 07/2024

## 2023-07-22 NOTE — Progress Notes (Addendum)
CC: Heartburn, belching  HPI:  Bruce.Bruce [name] is a 47 y.o. male living with a history stated below and presents today for followup. Please see problem based assessment and plan for additional details.  Past Medical History:  Diagnosis Date   Anxiety disorder    At risk for obstructive sleep apnea 04/15/2018   Bipolar 1 disorder (HCC)    Depression    Hepatitis B immune 04/15/2018   HLD (hyperlipidemia)    Hypertension    Schizophrenia (HCC)     Current Outpatient Medications on File Prior to Visit  Medication Sig Dispense Refill   albuterol (VENTOLIN HFA) 108 (90 Base) MCG/ACT inhaler Inhale 2 puffs into the lungs every 6 (six) hours as needed for wheezing or shortness of breath. 8 g 4   aspirin EC 81 MG tablet Take 1 tablet (81 mg total) by mouth daily. Swallow whole. 30 tablet 2   blood glucose meter kit and supplies Dispense based on patient and insurance preference. Use up to four times daily as directed. (FOR ICD-10 E10.9, E11.9). 1 each 0   hydrOXYzine (ATARAX/VISTARIL) 25 MG tablet Take 25 mg by mouth 2 (two) times daily as needed.     lamoTRIgine (LAMICTAL) 100 MG tablet Take 100 mg by mouth daily.     metoprolol succinate (TOPROL XL) 25 MG 24 hr tablet Take 1 tablet (25 mg total) by mouth daily. 30 tablet 0   mirtazapine (REMERON) 15 MG tablet Take 1 tablet (15 mg total) by mouth at bedtime. 30 tablet 0   mupirocin ointment (BACTROBAN) 2 % Apply 1 Application topically 2 (two) times daily. 22 g 0   nitroGLYCERIN (NITROSTAT) 0.4 MG SL tablet Place 1 tablet (0.4 mg total) under the tongue every 5 (five) minutes as needed for chest pain. 100 tablet 3   prazosin (MINIPRESS) 1 MG capsule Take 1 capsule (1 mg total) by mouth at bedtime. 30 capsule 0   senna (SENOKOT) 8.6 MG TABS tablet Take 1 tablet (8.6 mg total) by mouth daily as needed for mild constipation. 30 tablet 3   sertraline (ZOLOFT) 100 MG tablet Take 150 mg by mouth daily.     silver sulfADIAZINE (SILVADENE) 1 %  cream Apply 1 Application topically daily. 50 g 0   No current facility-administered medications on file prior to visit.    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  Tobacco Use   Smoking status: Former    Current packs/day: 0.00    Types: Cigarettes    Quit date: 05/09/2021    Years since quitting: 2.3   Smokeless tobacco: Never   Tobacco comments:    previous marijuana smoker  Vaping Use   Vaping  status: Never Used  Substance and Sexual Activity   Alcohol use: Not Currently   Drug use: Not Currently    Types: Marijuana   Sexual activity: Yes    Partners: Female  Other Topics Concern   Not on file  Social History Narrative   Not on file   Social Drivers of Health   Financial Resource Strain: Not on file  Food Insecurity: No Food Insecurity (09/28/2022)   Hunger Vital Sign    Worried About Running Out of Food in the Last Year: Never true    Ran Out of Food in the Last Year: Never true  Transportation Needs: No Transportation Needs (09/28/2022)   PRAPARE - Administrator, Civil Service (Medical): No    Lack of Transportation (Non-Medical): No  Physical Activity: Not on file  Stress: Not on file  Social Connections: Socially Isolated (09/28/2022)   Social Connection and Isolation Panel [NHANES]    Frequency of Communication with Friends and Family: Once a week     Frequency of Social Gatherings with Friends and Family: Never    Attends Religious Services: Never    Database administrator or Organizations: No    Attends Banker Meetings: Never    Marital Status: Separated  Intimate Partner Violence: Not At Risk (09/28/2022)   Humiliation, Afraid, Rape, and Kick questionnaire    Fear of Current or Ex-Partner: No    Emotionally Abused: No    Physically Abused: No    Sexually Abused: No    Review of Systems: ROS negative except for what is noted on the assessment and plan.  Vitals:   07/22/23 1307  BP: 116/66  Pulse: 84  Temp: 98.4 F (36.9 C)  TempSrc: Oral  SpO2: 98%  Weight: 288 lb 14.4 oz (131 kg)  Height: 6\' 5"  (1.956 m)    Physical Exam: Constitutional: well-appearing man sitting in chair, in no acute distress HENT: normocephalic atraumatic, mucous membranes moist Eyes: conjunctiva non-erythematous Cardiovascular: regular rate and rhythm, no m/r/g Pulmonary/Chest: normal work of breathing on room air, lungs clear to auscultation bilaterally Abdominal: soft, non-tender, non-distended MSK: normal bulk and tone Neurological: alert & oriented x 3, no focal deficit Skin: warm and dry Psych: normal mood and behavior  Assessment & Plan:   Patient seen with Dr. Sol Blazing  Gastroesophageal reflux disease Chief complaint of reflux.  Notes heartburn most days, newly worsened stench with his belches, and the sensation of food getting stuck in the upper esophagus after he swallows.  No emesis, no regurgitation, no stomach pain.  Does not feel that this is worsened by his Ozempic.  Possible dysphagia is a concerning change.  He has been on pantoprazole 40 twice daily for some time.  He is established with a gastroenterologist but I do not believe has been there in about 2 years. - Return to gastroenterologist, he has an appointment in February, he will ask if he can be moved up, might need a scope - Continue pantoprazole 40 twice daily.   Will start Pepcid 20 twice daily. -Discussed elevating the head and chest when sleeping, avoiding food before bed  Obesity, diabetes, and hypertension syndrome (HCC) Was seen one month ago, A1c 6.4 good response to metformin 1000 BID and Ozempic 0.25mg  weekly. Brought back today per concern of hypoglycemia with instructions to check sugars at home. Bruce [name] was not able to check his sugars, but notes that his symptoms, dizziness and sweats, have improved. He does have a significant  anxiety burden that may be contributing. Today, not actively concerned for hypoglycemia. I have asked him to periodically check home sugars in the morning. -Continue metformin 1000mg  twice daily and Ozempic 0.25mg  weekly  Anxiety and depression He has a significant disease burden from his anxiety, depression, possible bipolar and schizophrenia as well.  He is in touch with a psychiatrist who he sees every 3 months and a counselor whom he speaks to twice monthly.  These help him a good bit.  Do feel that he is grossly stable this time. -Continue Zoloft 150, prazosin 1 mg, Remeron 15, hydroxyzine 25 as needed per psychiatry  Pulmonary nodule Repeat non CT due 08/2023   Will order today to take place early 2025.  ADD: CT result as follows: Stable small right upper lobe pulmonary nodules, the largest 3 mm. These could be followed with repeat study in 1 year to ensure 2 years stability. Consider repeat CT in 07/2024  Healthcare maintenance Screening colonoscopy order placed. No reported family history of colon cancer. Mother had polyps.  RTC in 3 months for diabetes, HTN, GERD, OSA checkup.  Katheran James, D.O. [facility] Internal Medicine, PGY-1 Phone: [phone] Date 09/18/2023 Time 2:32 PM

## 2023-07-23 NOTE — Progress Notes (Signed)
 Internal Medicine Clinic Attending  I saw and evaluated the patient.  I personally confirmed the key portions of the history and exam documented by Dr.  Ninfa Meeker  and I reviewed pertinent patient test results.  The assessment, diagnosis, and plan were formulated together and I agree with the documentation in the resident's note.

## 2023-08-16 ENCOUNTER — Ambulatory Visit (HOSPITAL_COMMUNITY)
Admission: RE | Admit: 2023-08-16 | Discharge: 2023-08-16 | Disposition: A | Discharge status: Home / Self Care | Payer: MEDICAID | Source: Ambulatory Visit | Attending: Internal Medicine | Admitting: Internal Medicine

## 2023-08-16 DIAGNOSIS — R911 Solitary pulmonary nodule: Secondary | ICD-10-CM | POA: Diagnosis present

## 2023-08-22 ENCOUNTER — Other Ambulatory Visit: Payer: Self-pay | Admitting: Student

## 2023-08-22 ENCOUNTER — Other Ambulatory Visit: Payer: Self-pay | Admitting: Gastroenterology

## 2023-08-22 DIAGNOSIS — M5431 Sciatica, right side: Secondary | ICD-10-CM

## 2023-08-30 ENCOUNTER — Telehealth: Payer: Self-pay | Admitting: *Deleted

## 2023-08-30 ENCOUNTER — Telehealth: Payer: MEDICAID | Admitting: Adult Health

## 2023-08-30 NOTE — Telephone Encounter (Signed)
 ATC x2, no answer.  LVM that if he does not log on by 11:45 am he will have to reschedule.

## 2023-08-30 NOTE — Telephone Encounter (Signed)
 ATC x1 regarding mychart visit scheduled for today at 11:30 am.  Left VM that I would try back in 5 min if I do not see him logged in.

## 2023-09-04 ENCOUNTER — Encounter: Payer: Self-pay | Admitting: Dietician

## 2023-09-06 ENCOUNTER — Other Ambulatory Visit: Payer: Self-pay | Admitting: Student

## 2023-09-06 DIAGNOSIS — E1159 Type 2 diabetes mellitus with other circulatory complications: Secondary | ICD-10-CM

## 2023-09-18 ENCOUNTER — Other Ambulatory Visit: Payer: Self-pay | Admitting: Student

## 2023-09-18 ENCOUNTER — Other Ambulatory Visit: Payer: Self-pay | Admitting: Gastroenterology

## 2023-09-18 DIAGNOSIS — E785 Hyperlipidemia, unspecified: Secondary | ICD-10-CM

## 2023-09-18 DIAGNOSIS — I10 Essential (primary) hypertension: Secondary | ICD-10-CM

## 2023-09-24 ENCOUNTER — Ambulatory Visit (INDEPENDENT_AMBULATORY_CARE_PROVIDER_SITE_OTHER): Payer: MEDICAID | Admitting: Gastroenterology

## 2023-09-24 ENCOUNTER — Encounter: Payer: Self-pay | Admitting: Gastroenterology

## 2023-09-24 VITALS — BP 102/54 | HR 75 | Ht 77.0 in | Wt 277.5 lb

## 2023-09-24 DIAGNOSIS — R109 Unspecified abdominal pain: Secondary | ICD-10-CM

## 2023-09-24 DIAGNOSIS — R1310 Dysphagia, unspecified: Secondary | ICD-10-CM

## 2023-09-24 DIAGNOSIS — R1032 Left lower quadrant pain: Secondary | ICD-10-CM

## 2023-09-24 DIAGNOSIS — K581 Irritable bowel syndrome with constipation: Secondary | ICD-10-CM

## 2023-09-24 DIAGNOSIS — K219 Gastro-esophageal reflux disease without esophagitis: Secondary | ICD-10-CM

## 2023-09-24 DIAGNOSIS — Z1211 Encounter for screening for malignant neoplasm of colon: Secondary | ICD-10-CM

## 2023-09-24 DIAGNOSIS — R142 Eructation: Secondary | ICD-10-CM

## 2023-09-24 DIAGNOSIS — Z83719 Family history of colon polyps, unspecified: Secondary | ICD-10-CM

## 2023-09-24 DIAGNOSIS — Z800 Family history of malignant neoplasm of digestive organs: Secondary | ICD-10-CM

## 2023-09-24 MED ORDER — SUFLAVE 178.7 G PO SOLR: 1.0000 | Freq: Once | ORAL | 0 refills | Status: AC

## 2023-09-24 NOTE — Progress Notes (Signed)
 Chief Complaint: Eructation, GERD, dysphagia Primary GI MD: Dr. Stacia  HPI: 48 year old male history of IBS-C, hypertension, bipolar 1, schizophrenia, presents for evaluation of eructation GERD and dysphagia.  Last seen December 2022 by Dr. Stacia.  At that time his IBS-C was well-controlled on MiraLAX , senna as needed, Bentyl  as needed.  TODAY  He experiences recurrent belching with a foul odor, which ceased two days ago.  Patient reports heartburn in addition to dysphagia to solids and liquids.  He is on pantoprazole  twice daily for the symptoms and feels he continues to have breakthrough symptoms.  He denies current constipation and reports regular bowel movements with the use of dicyclomine  and Miralax . He occasionally takes ibuprofen  for pain but not on a daily basis.  He describes pain in his left lower back, which worsens at night and with movement, affecting his sleep and work. He has a history of sciatic pain and takes medication, including gabapentin .  Family history is significant for colon cancer in his uncle on his mother's side and polyps in his mother, prompting her to advise him to get checked.      Past Medical History:  Diagnosis Date   Anxiety disorder    At risk for obstructive sleep apnea 04/15/2018   Bipolar 1 disorder (HCC)    Depression    Hepatitis B immune 04/15/2018   HLD (hyperlipidemia)    Hypertension    Schizophrenia (HCC)     Past Surgical History:  Procedure Laterality Date   wisdom tooth removal      Current Outpatient Medications  Medication Sig Dispense Refill   albuterol  (VENTOLIN  HFA) 108 (90 Base) MCG/ACT inhaler Inhale 2 puffs into the lungs every 6 (six) hours as needed for wheezing or shortness of breath. 8 g 4   aspirin  EC 81 MG tablet Take 1 tablet (81 mg total) by mouth daily. Swallow whole. (Patient taking differently: Take 81 mg by mouth as needed. Swallow whole.) 30 tablet 2   atorvastatin  (LIPITOR) 40 MG tablet TAKE  1 TABLET (40 MG TOTAL) BY MOUTH DAILY. (BEDTIME) 90 tablet 1   dicyclomine  (BENTYL ) 20 MG tablet TAKE 1 TABLET BY MOUTH EVERY 6 (SIX) HOURS. (AM+NOON+PM+BEDTIME) 120 tablet 2   famotidine  (PEPCID ) 20 MG tablet Take 1 tablet (20 mg total) by mouth 2 (two) times daily. 60 tablet 3   gabapentin  (NEURONTIN ) 300 MG capsule Take 1 capsule (300 mg total) by mouth 3 (three) times daily. 90 capsule 10   hydrOXYzine (ATARAX/VISTARIL) 25 MG tablet Take 25 mg by mouth 2 (two) times daily as needed.     ibuprofen  (ADVIL ) 800 MG tablet Take 800 mg by mouth every 6 (six) hours as needed.     lamoTRIgine (LAMICTAL) 100 MG tablet Take 100 mg by mouth daily.     lisinopril  (ZESTRIL ) 20 MG tablet TAKE 1 TABLET (20 MG TOTAL) BY MOUTH DAILY. (AM) 90 tablet 0   metFORMIN  (GLUCOPHAGE ) 1000 MG tablet Take 1 tablet (1,000 mg total) by mouth 2 (two) times daily with a meal. 60 tablet 10   metoprolol  succinate (TOPROL  XL) 25 MG 24 hr tablet Take 1 tablet (25 mg total) by mouth daily. 30 tablet 0   mirtazapine  (REMERON ) 15 MG tablet Take 1 tablet (15 mg total) by mouth at bedtime. 30 tablet 0   OZEMPIC , 0.25 OR 0.5 MG/DOSE, 2 MG/3ML SOPN INJECT 0.5 MG INTO THE SKIN ONCE A WEEK. 3 mL 1   pantoprazole  (PROTONIX ) 40 MG tablet TAKE 1 TABLET (40 MG  TOTAL) BY MOUTH 2 (TWO) TIMES DAILY (AM+PM) 90 tablet 0   prazosin  (MINIPRESS ) 1 MG capsule Take 1 capsule (1 mg total) by mouth at bedtime. 30 capsule 0   risperiDONE (RISPERDAL) 0.5 MG tablet Take 0.5 mg by mouth at bedtime.     senna (SENOKOT) 8.6 MG TABS tablet Take 1 tablet (8.6 mg total) by mouth daily as needed for mild constipation. 30 tablet 3   sertraline  (ZOLOFT ) 100 MG tablet Take 150 mg by mouth daily.     blood glucose meter kit and supplies Dispense based on patient and insurance preference. Use up to four times daily as directed. (FOR ICD-10 E10.9, E11.9). (Patient not taking: Reported on 09/24/2023) 1 each 0   nitroGLYCERIN  (NITROSTAT ) 0.4 MG SL tablet Place 1 tablet (0.4  mg total) under the tongue every 5 (five) minutes as needed for chest pain. (Patient not taking: Reported on 09/24/2023) 100 tablet 3   No current facility-administered medications for this visit.    Allergies as of 09/24/2023   (No Known Allergies)    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  Tobacco Use   Smoking status: Former    Current packs/day: 0.00    Types: Cigarettes    Quit date: 05/09/2021    Years since quitting: 2.3   Smokeless tobacco: Never   Tobacco comments:    previous marijuana smoker  Vaping Use   Vaping status: Never Used  Substance and Sexual Activity   Alcohol use: Not Currently   Drug use: Not Currently    Types: Marijuana   Sexual activity: Yes    Partners: Female  Other Topics Concern   Not on file  Social History Narrative   Not on file   Social Drivers of Health   Financial Resource Strain: Not on file  Food Insecurity: No Food Insecurity (09/28/2022)   Hunger Vital Sign    Worried About Running Out of Food in the Last Year: Never true    Ran Out of Food in the Last Year: Never true  Transportation Needs: No Transportation Needs (09/28/2022)   PRAPARE - Administrator, Civil Service (Medical): No    Lack of Transportation (Non-Medical): No  Physical Activity: Not on file  Stress: Not on file  Social Connections: Socially Isolated  (09/28/2022)   Social Connection and Isolation Panel [NHANES]    Frequency of Communication with Friends and Family: Once a week    Frequency of Social Gatherings with Friends and Family: Never    Attends Religious Services: Never    Database Administrator or Organizations: No    Attends Banker Meetings: Never    Marital Status: Separated  Intimate Partner Violence: Not At Risk (09/28/2022)   Humiliation, Afraid, Rape, and Kick questionnaire    Fear of Current or Ex-Partner: No    Emotionally Abused: No    Physically Abused: No    Sexually Abused: No    Review of Systems:    Constitutional: No weight loss, fever, chills, weakness or fatigue HEENT: Eyes: No change in vision               Ears, Nose, Throat:  No change in hearing or congestion Skin: No rash or itching Cardiovascular: No chest pain, chest pressure or palpitations   Respiratory: No SOB or cough Gastrointestinal: See HPI and otherwise negative Genitourinary: No dysuria or change in urinary frequency Neurological: No headache, dizziness or syncope Musculoskeletal: No new muscle or joint pain Hematologic: No bleeding or bruising Psychiatric: No history of depression or anxiety    Physical Exam:  Vital signs: BP (!) 102/54   Pulse 75   Ht 6' 5 (1.956 m)   Wt 277 lb 8 oz (125.9 kg)   SpO2 93%   BMI 32.91 kg/m   Constitutional: NAD, Well developed, Well nourished, alert and cooperative Head:  Normocephalic and atraumatic. Eyes:   PEERL, EOMI. No icterus. Conjunctiva pink. Respiratory: Respirations even and unlabored. Lungs clear to auscultation bilaterally.   No wheezes, crackles, or rhonchi.  Cardiovascular:  Regular rate and rhythm. No peripheral edema, cyanosis or pallor.  Gastrointestinal:  Soft, nondistended, nontender. No rebound or guarding. Normal bowel sounds. No appreciable masses or hepatomegaly. Rectal:  Not performed.  Msk:  Symmetrical without gross deformities. Without edema, no  deformity or joint abnormality.  Neurologic:  Alert and  oriented x4;  grossly normal neurologically.  Skin:   Dry and intact without significant lesions or rashes. Psychiatric: Oriented to person, place and time. Demonstrates good judgement and reason without abnormal affect or behaviors.   RELEVANT LABS AND IMAGING: CBC    Component Value Date/Time   WBC 8.4 08/30/2022 1356   WBC 15.9 (H) 12/05/2021 1054   RBC 5.78 08/30/2022 1356   RBC 5.59 12/05/2021 1054   HGB 15.1 08/30/2022 1356   HCT 46.1 08/30/2022 1356   PLT 323 08/30/2022 1356   MCV 80 08/30/2022 1356   MCH 26.1 (L) 08/30/2022 1356   MCH 26.1 12/05/2021 1054   MCHC 32.8 08/30/2022 1356   MCHC 32.7 12/05/2021 1054   RDW 13.8 08/30/2022 1356   LYMPHSABS 3.4 (H) 08/30/2022 1356   MONOABS 0.7 10/03/2021 1343   EOSABS 0.1 08/30/2022 1356   BASOSABS 0.1 08/30/2022 1356    CMP     Component Value Date/Time   NA 135 06/17/2023 1440   K 4.7 06/17/2023 1440   CL 102 06/17/2023 1440   CO2 17 (L) 06/17/2023 1440   GLUCOSE 103 (H) 06/17/2023 1440   GLUCOSE 141 (H) 12/05/2021 1054   BUN 11 06/17/2023 1440   CREATININE 1.35 (H) 06/17/2023 1440   CALCIUM  9.1 06/17/2023 1440   PROT 6.5 12/05/2021 1054   PROT 8.0 04/15/2018 1617   ALBUMIN 3.6 12/05/2021 1054   ALBUMIN 4.6 04/15/2018 1617   AST 28 12/05/2021 1054   ALT 25 12/05/2021 1054   ALKPHOS 105 12/05/2021 1054   BILITOT 0.6 12/05/2021 1054   BILITOT 0.6 04/15/2018 1617   GFRNONAA >60 12/05/2021 1054   GFRAA >60 11/16/2019 1941     Assessment/Plan:      Gastroesophageal Reflux Disease (GERD) Eructation Dysphagia Reports of belching and heartburn. Difficulty swallowing both solids  and liquids.  Symptoms persist despite pantoprazole  twice daily.  Occasional NSAID use.  No previous EGD - EGD for further evaluation of esophagitis, gastritis, PUD, globus sensation, stricture. - I thoroughly discussed the procedure with the patient (at bedside) to include nature  of the procedure, alternatives, benefits, and risks (including but not limited to bleeding, infection, perforation, anesthesia/cardiac pulmonary complications).  Patient verbalized understanding and gave verbal consent to proceed with procedure.   Colon Cancer Screening Family history of colon cancer (uncle on mother's side) and patient is of age for screening.  Mother with polyps - Schedule colonoscopy - I thoroughly discussed the procedure with the patient (at bedside) to include nature of the procedure, alternatives, benefits, and risks (including but not limited to bleeding, infection, perforation, anesthesia/cardiac pulmonary complications).  Patient verbalized understanding and gave verbal consent to proceed with procedure.    IBS-C Reports of good bowel movements with current regimen of Bentyl  and Miralax . --Continue Bentyl  and Miralax  as needed.   Left flank pain Reports of pain in the lower left side and back, worse with movement. No change with bowel movements. Currently on medication for sciatica.  Suspect more musculoskeletal in nature/related to sciatica.  Bowel movements are currently well-controlled.    [name] 09/24/2023, 2:37 PM  Cc: Bruce Bruckner, DO

## 2023-09-24 NOTE — Patient Instructions (Signed)
 You have been scheduled for a colonoscopy/EGD. Please follow written instructions given to you at your visit today.   If you use inhalers (even only as needed), please bring them with you on the day of your procedure.  DO NOT TAKE 7 DAYS PRIOR TO TEST- Trulicity (dulaglutide) Ozempic , Wegovy  (semaglutide ) Mounjaro (tirzepatide) Bydureon Bcise (exanatide extended release)  DO NOT TAKE 1 DAY PRIOR TO YOUR TEST Rybelsus  (semaglutide ) Adlyxin (lixisenatide) Victoza (liraglutide) Byetta (exanatide) ___________________________________________________________________________  Rosine will receive your bowel preparation through [facility], which ensures the lowest copay and home delivery, with outreach via text or call from an [phone] number. Please respond promptly to avoid rescheduling of your procedure. If you are interested in alternative options or have any questions regarding your prep, please contact them at [phone] ____________________________________________________________________________  Your Provider Has Sent Your Bowel Prep Regimen To [facility]   [facility] will contact you to verify your information and collect your copay, if applicable. Enjoy the comfort of your home while your prescription is mailed to you, FREE of any shipping charges.   [facility] accepts all major insurance benefits and applies discounts & coupons.  Have additional questions?   Chat: [email or web address] Call: [phone] Email: [email or web address] NCPDP: [id number]  How will [facility] contact you?  With a Welcome phone call,  a Welcome text and a checkout link in text form.  Texts you receive from [phone] Are NOT Spam.  *To set up delivery, you must complete the checkout process via link or speak to one of the patient care representatives. If [facility] is unable to reach you, your prescription may be delayed.  To avoid long hold times on the phone, you may also utilize the secure  chat feature on the [facility] website to request that they call you back for transaction completion or to expedite your concerns.

## 2023-09-25 ENCOUNTER — Ambulatory Visit (INDEPENDENT_AMBULATORY_CARE_PROVIDER_SITE_OTHER): Payer: MEDICAID | Admitting: Student

## 2023-09-25 VITALS — BP 104/65 | HR 86 | Temp 97.9°F | Ht 77.0 in | Wt 277.5 lb

## 2023-09-25 DIAGNOSIS — Z6832 Body mass index (BMI) 32.0-32.9, adult: Secondary | ICD-10-CM

## 2023-09-25 DIAGNOSIS — I152 Hypertension secondary to endocrine disorders: Secondary | ICD-10-CM | POA: Diagnosis not present

## 2023-09-25 DIAGNOSIS — Z7984 Long term (current) use of oral hypoglycemic drugs: Secondary | ICD-10-CM

## 2023-09-25 DIAGNOSIS — E1169 Type 2 diabetes mellitus with other specified complication: Secondary | ICD-10-CM

## 2023-09-25 DIAGNOSIS — E1159 Type 2 diabetes mellitus with other circulatory complications: Secondary | ICD-10-CM | POA: Diagnosis not present

## 2023-09-25 DIAGNOSIS — E669 Obesity, unspecified: Secondary | ICD-10-CM

## 2023-09-25 DIAGNOSIS — Z0000 Encounter for general adult medical examination without abnormal findings: Secondary | ICD-10-CM

## 2023-09-25 DIAGNOSIS — M5410 Radiculopathy, site unspecified: Secondary | ICD-10-CM | POA: Diagnosis not present

## 2023-09-25 DIAGNOSIS — Z7985 Long-term (current) use of injectable non-insulin antidiabetic drugs: Secondary | ICD-10-CM

## 2023-09-25 DIAGNOSIS — K219 Gastro-esophageal reflux disease without esophagitis: Secondary | ICD-10-CM | POA: Diagnosis not present

## 2023-09-25 MED ORDER — SEMAGLUTIDE (1 MG/DOSE) 4 MG/3ML ~~LOC~~ SOPN: 1.0000 mg | PEN_INJECTOR | SUBCUTANEOUS | 1 refills | Status: DC

## 2023-09-25 NOTE — Patient Instructions (Signed)
 Thank you, [name] for allowing us  to provide your care today. Today we discussed your diabetes medications and neck/back pain.  I have ordered the following labs for you:  Lab Orders  No laboratory test(s) ordered today     Tests ordered today:  None  Referrals ordered today:   Referral Orders  No referral(s) requested today     I have ordered the following medication/changed the following medications:   Stop the following medications: Medications Discontinued During This Encounter  Medication Reason   OZEMPIC , 0.25 OR 0.5 MG/DOSE, 2 MG/3ML SOPN      Start the following medications: Meds ordered this encounter  Medications   Semaglutide , 1 MG/DOSE, 4 MG/3ML SOPN    Sig: Inject 1 mg into the skin once a week.    Dispense:  3 mL    Refill:  1     Follow up: 4-6 weeks for diabetes (Ozempic  dose) and neck/back pain   Remember:   For your diabetes/weight loss:  - Continue to take Metformin  500 twice a day with meals - You can finish your last Ozempic  0.5 mg injection. The next Sunday, you can start taking the Ozempic  1 mg injection. You will continue to take it until your follow up appointment in 4-6 weeks when we determine if you should go up to 2 mg injections.   For your neck/back pain:  - Continue to apply heat, muscle cream (such as Bengay), gabapentin  300 mg up to three times a day, and Tylenol  (up to 1000 mg every 8 hours as needed). You can also add in lidocaine  patches to put in your lower back and around your left shoulder.  - We will reassess your pain in 4-6 weeks. If it has become more bothersome or difficult to manage, we can discuss completing some imaging (xrays of your neck and back) and a possible referral to an orthopedic surgeon.   Should you have any questions or concerns please call the internal medicine clinic at [phone].     Bruce Boyett Arellano Zameza, MD PGY-1 Internal Medicine Teaching Progam [facility]

## 2023-09-25 NOTE — Progress Notes (Signed)
 Established Patient Office Visit  Subjective   Patient ID: Bruce [name], male    DOB: 14-Apr-1976  Age: 48 y.o. MRN: [id number]  Chief Complaint  Patient presents with   LLQ     pain in the lower left side and back, worse with movement and at night. Getting worse    Diabetes    Discuss increasing ozempic  dose     Patient is a 48 y.o. with a past medical history stated below who presents today for follow-up for GERD, diabetes, healthcare maintenance as well as left side pain. He was last seen at [facility] on 07/22/23. Please see problem based assessment and plan for additional details.     Past Medical History:  Diagnosis Date   Anxiety disorder    At risk for obstructive sleep apnea 04/15/2018   Bipolar 1 disorder (HCC)    Depression    Hepatitis B immune 04/15/2018   HLD (hyperlipidemia)    Hypertension    Schizophrenia (HCC)       Review of Systems  Respiratory:  Negative for shortness of breath.   Cardiovascular:  Negative for chest pain, palpitations and leg swelling.  Gastrointestinal:  Negative for abdominal pain, nausea and vomiting.  Genitourinary:  Negative for dysuria.     Objective:     BP 104/65 (BP Location: Left Arm, Patient Position: Sitting, Cuff Size: Normal)   Pulse 86   Temp 97.9 F (36.6 C) (Oral)   Ht 6' 5 (1.956 m)   Wt 277 lb 8 oz (125.9 kg)   SpO2 98%   BMI 32.91 kg/m  BP Readings from Last 3 Encounters:  09/25/23 104/65  09/24/23 (!) 102/54  07/22/23 116/66   Wt Readings from Last 3 Encounters:  09/25/23 277 lb 8 oz (125.9 kg)  09/24/23 277 lb 8 oz (125.9 kg)  07/22/23 288 lb 14.4 oz (131 kg)      Physical Exam HENT:     Head: Normocephalic and atraumatic.  Cardiovascular:     Rate and Rhythm: Normal rate and regular rhythm.  Pulmonary:     Effort: Pulmonary effort is normal.     Breath sounds: Normal breath sounds.  Abdominal:     General: Bowel sounds are normal.     Palpations: Abdomen is soft.     Tenderness: There  is no abdominal tenderness.  Musculoskeletal:        General: No swelling. Normal range of motion.     Comments: Positive Spurling test on the left side; slight tenderness lateral to midline of lower back; sensation, strength, ROM WNL bilaterally  Skin:    General: Skin is warm and dry.  Neurological:     General: No focal deficit present.     Mental Status: He is alert.  Psychiatric:        Mood and Affect: Mood normal.        Behavior: Behavior normal.    No results found for any visits on 09/25/23.  Last hemoglobin A1c Lab Results  Component Value Date   HGBA1C 6.4 (A) 06/17/2023    The 10-year ASCVD risk score (Arnett DK, et al., 2019) is: 8.8%    Assessment & Plan:   Problem List Items Addressed This Visit     Gastroesophageal reflux disease - Primary   Patient endorses some improvement in symptoms with pantoprazole  40 twice daily and Pepcid  20 twice daily. States he has an appointment with GI on 3/28 for EGD and colonoscopy. No new symptoms or related concerns  today.  Plan - Continue pantoprazole  40 twice daily and Pepcid  20 twice daily - EGD and Colonoscopy scheduled on 11/15/23       Healthcare maintenance   Patient scheduled for screening colonoscopy on 11/15/23, will follow up as needed.       Obesity, diabetes, and hypertension syndrome (HCC)   Hgb A1c 6.4% 06/17/23. Patient currently taking metformin  500 mg BID (500 mg in pill packs, not 1000 mg as previously noted) and Ozempic  0.5 mg weekly injections. Does report some GI upset but states it is manageable. Would like to go up on Ozempic  dose as he reports the current dose is not suppressing his appetite as well anymore.  Plan - Continue metformin  500 mg BID  - Use last dose of Ozempic  0.5 mg injection, then start Ozempic  1 mg injections weekly - Return to clinic in 4-6 weeks for Hgb A1c and possible titration of Ozempic        Relevant Medications   Semaglutide , 1 MG/DOSE, 4 MG/3ML SOPN   Radiculopathy    Patient reports midline back pain, present for over a month, pain often on left side, worsens with movement and activity. Feels some numbness and tingling on left arm, leg. Thinks his left arm may feel weaker than the left. Denies any saddle numbness, syncope, fever, nausea/vomiting, weight loss, pain that does not go away, or incontinence. Has tried Tylenol , heat, and topical muscle cream with some improvement. Takes gabapentin  300 TID for neuropathic pain and that is also helpful. Denies any previous spinal imaging. Has had PT in the past for something else but not for current concern.  Patient's vitals stable, afebrile. Spurling test positive on left side indicating possible cervical radiculopathy. On exam, upper and lower extremity ROM, strength, and sensation WNL. Gait appears stable/normal. Given timeline of symptoms, lack of red flag symptoms and improvement with Tylenol , heat, topicals will continue with conservative management and reassess at return clinic visit. Patient agreeable to plan.  Plan - For pain management: continue to apply heat, muscle cream (such as Bengay), take gabapentin  300 mg up to three times a day, and take Tylenol  (up to 1000 mg every 8 hours as needed). Can also apply lidocaine  patches to lower back and left shoulder.  - Reassess pain in 4-6 weeks, if no improvement or pain is worse can discuss obtaining xrays of cervical and lumbar spine and a possible referral to PT and an orthopedic surgeon.        Patient discussed with Dr. Lovie.  Return 4-6 weeks, for diabetes (Ozempic  dose) and neck/back pain.   Milah Recht Arellano Zameza, MD

## 2023-09-28 DIAGNOSIS — M5410 Radiculopathy, site unspecified: Secondary | ICD-10-CM | POA: Insufficient documentation

## 2023-09-28 NOTE — Assessment & Plan Note (Addendum)
 Patient endorses some improvement in symptoms with pantoprazole  40 twice daily and Pepcid  20 twice daily. States he has an appointment with GI on 3/28 for EGD and colonoscopy. No new symptoms or related concerns today.  Plan - Continue pantoprazole  40 twice daily and Pepcid  20 twice daily - EGD and Colonoscopy scheduled on 11/15/23

## 2023-09-28 NOTE — Assessment & Plan Note (Signed)
 Hgb A1c 6.4% 06/17/23. Patient currently taking metformin  500 mg BID (500 mg in pill packs, not 1000 mg as previously noted) and Ozempic  0.5 mg weekly injections. Does report some GI upset but states it is manageable. Would like to go up on Ozempic  dose as he reports the current dose is not suppressing his appetite as well anymore.  Plan - Continue metformin  500 mg BID  - Use last dose of Ozempic  0.5 mg injection, then start Ozempic  1 mg injections weekly - Return to clinic in 4-6 weeks for Hgb A1c and possible titration of Ozempic 

## 2023-09-28 NOTE — Assessment & Plan Note (Signed)
 Patient scheduled for screening colonoscopy on 11/15/23, will follow up as needed.

## 2023-09-28 NOTE — Assessment & Plan Note (Signed)
 Patient reports midline back pain, present for over a month, pain often on left side, worsens with movement and activity. Feels some numbness and tingling on left arm, leg. Thinks his left arm may feel weaker than the left. Denies any saddle numbness, syncope, fever, nausea/vomiting, weight loss, pain that does not go away, or incontinence. Has tried Tylenol , heat, and topical muscle cream with some improvement. Takes gabapentin  300 TID for neuropathic pain and that is also helpful. Denies any previous spinal imaging. Has had PT in the past for something else but not for current concern.  Patient's vitals stable, afebrile. Spurling test positive on left side indicating possible cervical radiculopathy. On exam, upper and lower extremity ROM, strength, and sensation WNL. Gait appears stable/normal. Given timeline of symptoms, lack of red flag symptoms and improvement with Tylenol , heat, topicals will continue with conservative management and reassess at return clinic visit. Patient agreeable to plan.  Plan - For pain management: continue to apply heat, muscle cream (such as Bengay), take gabapentin  300 mg up to three times a day, and take Tylenol  (up to 1000 mg every 8 hours as needed). Can also apply lidocaine  patches to lower back and left shoulder.  - Reassess pain in 4-6 weeks, if no improvement or pain is worse can discuss obtaining xrays of cervical and lumbar spine and a possible referral to PT and an orthopedic surgeon.

## 2023-09-29 NOTE — Progress Notes (Signed)
 Agree with the assessment and plan as outlined by Boone Master, PA-C.

## 2023-09-30 NOTE — Progress Notes (Signed)
 Internal Medicine Clinic Attending  Case discussed with the resident at the time of the visit.  We reviewed the resident's history and exam and pertinent patient test results.  I agree with the assessment, diagnosis, and plan of care documented in the resident's note.

## 2023-10-03 ENCOUNTER — Emergency Department (HOSPITAL_COMMUNITY): Payer: MEDICAID

## 2023-10-03 ENCOUNTER — Other Ambulatory Visit: Payer: Self-pay

## 2023-10-03 ENCOUNTER — Emergency Department (HOSPITAL_COMMUNITY)
Admission: EM | Admit: 2023-10-03 | Discharge: 2023-10-03 | Disposition: A | Discharge status: Home / Self Care | Payer: MEDICAID | Attending: Emergency Medicine | Admitting: Emergency Medicine

## 2023-10-03 DIAGNOSIS — Z79899 Other long term (current) drug therapy: Secondary | ICD-10-CM | POA: Diagnosis not present

## 2023-10-03 DIAGNOSIS — D72829 Elevated white blood cell count, unspecified: Secondary | ICD-10-CM | POA: Insufficient documentation

## 2023-10-03 DIAGNOSIS — Z7984 Long term (current) use of oral hypoglycemic drugs: Secondary | ICD-10-CM | POA: Insufficient documentation

## 2023-10-03 DIAGNOSIS — R319 Hematuria, unspecified: Secondary | ICD-10-CM | POA: Diagnosis present

## 2023-10-03 DIAGNOSIS — M549 Dorsalgia, unspecified: Secondary | ICD-10-CM | POA: Diagnosis not present

## 2023-10-03 DIAGNOSIS — Z7982 Long term (current) use of aspirin: Secondary | ICD-10-CM | POA: Diagnosis not present

## 2023-10-03 DIAGNOSIS — N3001 Acute cystitis with hematuria: Secondary | ICD-10-CM | POA: Insufficient documentation

## 2023-10-03 LAB — URINALYSIS, ROUTINE W REFLEX MICROSCOPIC
Bilirubin Urine: NEGATIVE
Glucose, UA: NEGATIVE mg/dL
Ketones, ur: NEGATIVE mg/dL
Nitrite: NEGATIVE
Protein, ur: 100 mg/dL — AB
RBC / HPF: >50 RBC/hpf (ref 0–5)
Specific Gravity, Urine: 1.019 (ref 1.005–1.030)
WBC, UA: >50 WBC/hpf (ref 0–5)
pH: 5 (ref 5.0–8.0)

## 2023-10-03 LAB — CBC
HCT: 42 % (ref 39.0–52.0)
Hemoglobin: 13.6 g/dL (ref 13.0–17.0)
MCH: 26.1 pg (ref 26.0–34.0)
MCHC: 32.4 g/dL (ref 30.0–36.0)
MCV: 80.6 fL (ref 80.0–100.0)
Platelets: 371 10*3/uL (ref 150–400)
RBC: 5.21 MIL/uL (ref 4.22–5.81)
RDW: 13.7 % (ref 11.5–15.5)
WBC: 11.6 10*3/uL — ABNORMAL HIGH (ref 4.0–10.5)
nRBC: 0 % (ref 0.0–0.2)

## 2023-10-03 LAB — COMPREHENSIVE METABOLIC PANEL
ALT: 31 U/L (ref 0–44)
AST: 20 U/L (ref 15–41)
Albumin: 3.4 g/dL — ABNORMAL LOW (ref 3.5–5.0)
Alkaline Phosphatase: 73 U/L (ref 38–126)
Anion gap: 9 (ref 5–15)
BUN: 22 mg/dL — ABNORMAL HIGH (ref 6–20)
CO2: 20 mmol/L — ABNORMAL LOW (ref 22–32)
Calcium: 9.5 mg/dL (ref 8.9–10.3)
Chloride: 106 mmol/L (ref 98–111)
Creatinine, Ser: 1.65 mg/dL — ABNORMAL HIGH (ref 0.61–1.24)
GFR, Estimated: 52 mL/min — ABNORMAL LOW (ref >60)
Glucose, Bld: 165 mg/dL — ABNORMAL HIGH (ref 70–99)
Potassium: 4.4 mmol/L (ref 3.5–5.1)
Sodium: 135 mmol/L (ref 135–145)
Total Bilirubin: 0.4 mg/dL (ref 0.0–1.2)
Total Protein: 6.7 g/dL (ref 6.5–8.1)

## 2023-10-03 MED ORDER — LACTATED RINGERS IV BOLUS: 1000.0000 mL | Freq: Once | INTRAVENOUS | Status: DC

## 2023-10-03 MED ORDER — SODIUM CHLORIDE 0.9 % IV SOLN
1.0000 g | Freq: Once | INTRAVENOUS | Status: DC
  Filled 2023-10-03: qty 10

## 2023-10-03 MED ORDER — NITROFURANTOIN MONOHYD MACRO 100 MG PO CAPS: 100.0000 mg | ORAL_CAPSULE | Freq: Two times a day (BID) | ORAL | 0 refills | Status: AC

## 2023-10-03 NOTE — ED Provider Triage Note (Signed)
Emergency Medicine Provider Triage Evaluation Note  Bruce [name] , a 48 y.o. male  was evaluated in triage.  Pt complains of dysuria for the past 4 days.  Also reports episodes of hematuria when urinating.  States he has "chunks of meat" coming out of his penis when urinating, and then has some relief after these pass.  He reports some intermittent back pain as well.  States he has not been sexually active for over a year, denies any concern for STIs.  Denies any other penile discharge.  Review of Systems  Positive: As above Negative: As above  Physical Exam  BP 130/84   Pulse 90   Temp 98.3 F (36.8 C) (Oral)   Resp 18   Ht 6\' 5"  (1.956 m)   Wt 124.3 kg   SpO2 98%   BMI 32.49 kg/m  Gen:   Awake, no distress   Resp:  Normal effort  MSK:   Moves extremities without difficulty    Medical Decision Making  Medically screening exam initiated at 1:56 PM.  Appropriate orders placed.  Bruce [name] was informed that the remainder of the evaluation will be completed by another provider, this initial triage assessment does not replace that evaluation, and the importance of remaining in the ED until their evaluation is complete.  Patient comfortable appearing, declines anything for pain at this time.   Arabella Merles, PA-C 10/03/23 1357

## 2023-10-03 NOTE — Discharge Instructions (Addendum)
Take the antibiotics for the entire course even if your symptoms improve.  Follow-up with urology.  Take the first dose of antibiotics tomorrow morning.  Make sure you are drinking plenty of water.

## 2023-10-03 NOTE — ED Provider Notes (Signed)
[place] EMERGENCY DEPARTMENT AT [facility] Provider Note   CSN: [id number] Arrival date & time: 10/03/23  1304     History  Chief Complaint  Patient presents with   Hematuria    Bruce [name] is a 48 y.o. male.   Hematuria   Pt complains of dysuria for the past 4 days.  Also reports episodes of hematuria when urinating.  States he has "chunks of meat" coming out of his penis when urinating, and then has some relief after these pass.  He reports some intermittent back pain as well.   States he has not been sexually active for over a year, denies any concern for STIs.  Denies any penile discharge.  Denies any fevers or chills.  No nausea or vomiting no chest pain or abdominal pain    Home Medications Prior to Admission medications   Medication Sig Start Date End Date Taking? Authorizing Provider  nitrofurantoin, macrocrystal-monohydrate, (MACROBID) 100 MG capsule Take 1 capsule (100 mg total) by mouth 2 (two) times daily for 7 days. 10/03/23 10/10/23 Yes Pernella Ackerley S, PA  albuterol (VENTOLIN HFA) 108 (90 Base) MCG/ACT inhaler Inhale 2 puffs into the lungs every 6 (six) hours as needed for wheezing or shortness of breath. 09/28/22   Mapp, Gaylyn Cheers, MD  aspirin EC 81 MG tablet Take 1 tablet (81 mg total) by mouth daily. Swallow whole. Patient taking differently: Take 81 mg by mouth as needed. Swallow whole. 12/07/22 12/07/23  Lyndle Herrlich, MD  atorvastatin (LIPITOR) 40 MG tablet TAKE 1 TABLET (40 MG TOTAL) BY MOUTH DAILY. (BEDTIME) 09/18/23   Katheran James, DO  blood glucose meter kit and supplies Dispense based on patient and insurance preference. Use up to four times daily as directed. (FOR ICD-10 E10.9, E11.9). Patient not taking: Reported on 09/24/2023 06/17/23   Nooruddin, Jason Fila, MD  dicyclomine (BENTYL) 20 MG tablet TAKE 1 TABLET BY MOUTH EVERY 6 (SIX) HOURS. (AM+NOON+PM+BEDTIME) 09/18/23   Katheran James, DO  famotidine (PEPCID) 20 MG tablet Take 1  tablet (20 mg total) by mouth 2 (two) times daily. 07/22/23 07/21/24  Katheran James, DO  gabapentin (NEURONTIN) 300 MG capsule Take 1 capsule (300 mg total) by mouth 3 (three) times daily. 08/23/23   Katheran James, DO  hydrOXYzine (ATARAX/VISTARIL) 25 MG tablet Take 25 mg by mouth 2 (two) times daily as needed. 02/16/21   [provider]  ibuprofen (ADVIL) 800 MG tablet Take 800 mg by mouth every 6 (six) hours as needed. 08/22/23   [provider]  lamoTRIgine (LAMICTAL) 100 MG tablet Take 100 mg by mouth daily. 02/16/21   [provider]  lisinopril (ZESTRIL) 20 MG tablet TAKE 1 TABLET (20 MG TOTAL) BY MOUTH DAILY. (AM) 09/18/23   Katheran James, DO  metFORMIN (GLUCOPHAGE) 1000 MG tablet Take 1 tablet (1,000 mg total) by mouth 2 (two) times daily with a meal. 08/23/23   Juberg, Christopher, DO  metoprolol succinate (TOPROL XL) 25 MG 24 hr tablet Take 1 tablet (25 mg total) by mouth daily. 12/31/22 12/31/23  Lyndle Herrlich, MD  mirtazapine (REMERON) 15 MG tablet Take 1 tablet (15 mg total) by mouth at bedtime. 12/31/22   Lyndle Herrlich, MD  nitroGLYCERIN (NITROSTAT) 0.4 MG SL tablet Place 1 tablet (0.4 mg total) under the tongue every 5 (five) minutes as needed for chest pain. Patient not taking: Reported on 09/24/2023 05/01/23 04/30/24  Katheran James, DO  pantoprazole (PROTONIX) 40 MG tablet TAKE 1 TABLET (40 MG TOTAL) BY MOUTH 2 (TWO)  TIMES DAILY (AM+PM) 09/18/23   Jenel Lucks, MD  prazosin (MINIPRESS) 1 MG capsule Take 1 capsule (1 mg total) by mouth at bedtime. 05/29/23   Katheran James, DO  risperiDONE (RISPERDAL) 0.5 MG tablet Take 0.5 mg by mouth at bedtime. 09/18/23   [provider]  Semaglutide, 1 MG/DOSE, 4 MG/3ML SOPN Inject 1 mg into the skin once a week. 09/25/23   Philomena Doheny, MD  senna (SENOKOT) 8.6 MG TABS tablet Take 1 tablet (8.6 mg total) by mouth daily as needed for mild constipation. 06/09/21    Jenel Lucks, MD  sertraline (ZOLOFT) 100 MG tablet Take 150 mg by mouth daily. 02/16/21   [provider]      Allergies    Patient has no known allergies.    Review of Systems   Review of Systems  Genitourinary:  Positive for hematuria.    Physical Exam Updated Vital Signs BP 130/84   Pulse 90   Temp 98.3 F (36.8 C) (Oral)   Resp 18   Ht 6\' 5"  (1.956 m)   Wt 124.3 kg   SpO2 98%   BMI 32.49 kg/m  Physical Exam Vitals and nursing note reviewed.  Constitutional:      General: He is not in acute distress. HENT:     Head: Normocephalic and atraumatic.     Nose: Nose normal.  Eyes:     General: No scleral icterus. Cardiovascular:     Rate and Rhythm: Normal rate and regular rhythm.     Pulses: Normal pulses.     Heart sounds: Normal heart sounds.  Pulmonary:     Effort: Pulmonary effort is normal. No respiratory distress.     Breath sounds: No wheezing.  Abdominal:     Palpations: Abdomen is soft.     Tenderness: There is no abdominal tenderness. There is no guarding or rebound.  Musculoskeletal:     Cervical back: Normal range of motion.     Right lower leg: No edema.     Left lower leg: No edema.  Skin:    General: Skin is warm and dry.     Capillary Refill: Capillary refill takes less than 2 seconds.  Neurological:     Mental Status: He is alert. Mental status is at baseline.  Psychiatric:        Mood and Affect: Mood normal.        Behavior: Behavior normal.     ED Results / Procedures / Treatments   Labs (all labs ordered are listed, but only abnormal results are displayed) Labs Reviewed  URINALYSIS, ROUTINE W REFLEX MICROSCOPIC - Abnormal; Notable for the following components:      Result Value   APPearance CLOUDY (*)    Hgb urine dipstick LARGE (*)    Protein, ur 100 (*)    Leukocytes,Ua MODERATE (*)    Bacteria, UA FEW (*)    Non Squamous Epithelial 0-5 (*)    All other components within normal limits  CBC - Abnormal; Notable  for the following components:   WBC 11.6 (*)    All other components within normal limits  COMPREHENSIVE METABOLIC PANEL - Abnormal; Notable for the following components:   CO2 20 (*)    Glucose, Bld 165 (*)    BUN 22 (*)    Creatinine, Ser 1.65 (*)    Albumin 3.4 (*)    GFR, Estimated 52 (*)    All other components within normal limits  URINE CULTURE  EKG None  Radiology CT Renal Stone Study Result Date: 10/03/2023 CLINICAL DATA:  Abdominal/flank pain.  Hematuria. EXAM: CT ABDOMEN AND PELVIS WITHOUT CONTRAST TECHNIQUE: Multidetector CT imaging of the abdomen and pelvis was performed following the standard protocol without IV contrast. RADIATION DOSE REDUCTION: This exam was performed according to the departmental dose-optimization program which includes automated exposure control, adjustment of the mA and/or kV according to patient size and/or use of iterative reconstruction technique. COMPARISON:  CT scan abdomen and pelvis from 12/05/2021. FINDINGS: Lower chest: The lung bases are clear. No pleural effusion. The heart is normal in size. No pericardial effusion. Hepatobiliary: The liver is normal in size. Non-cirrhotic configuration. No suspicious mass. No intrahepatic or extrahepatic bile duct dilation. No calcified gallstones. Normal gallbladder wall thickness. No pericholecystic inflammatory changes. Pancreas: Unremarkable. No pancreatic ductal dilatation or surrounding inflammatory changes. Spleen: Within normal limits. No focal lesion. Adrenals/Urinary Tract: Adrenal glands are unremarkable. No suspicious renal mass. No hydronephrosis. No renal or ureteric calculi. Unremarkable urinary bladder. Stomach/Bowel: No disproportionate dilation of the small or large bowel loops. No evidence of abnormal bowel wall thickening or inflammatory changes. The appendix is unremarkable. Vascular/Lymphatic: No ascites or pneumoperitoneum. No abdominal or pelvic lymphadenopathy, by size criteria. No  aneurysmal dilation of the major abdominal arteries. There are mild peripheral atherosclerotic vascular calcifications of the aorta and its major branches. Reproductive: Normal size prostate. Symmetric seminal vesicles. Other: There are fat containing umbilical and bilateral inguinal hernias. The soft tissues and abdominal wall are otherwise unremarkable. Musculoskeletal: No suspicious osseous lesions. There are mild multilevel degenerative changes in the visualized spine. IMPRESSION: 1. No nephroureterolithiasis or obstructive uropathy. 2. No acute inflammatory process identified within the abdomen or pelvis. 3. Multiple other nonacute observations, as described above. Electronically Signed   By: Jules Schick M.D.   On: 10/03/2023 17:41    Procedures Procedures    Medications Ordered in ED Medications  cefTRIAXone (ROCEPHIN) 1 g in sodium chloride 0.9 % 100 mL IVPB (has no administration in time range)  lactated ringers bolus 1,000 mL (has no administration in time range)    ED Course/ Medical Decision Making/ A&P                                 Medical Decision Making Amount and/or Complexity of Data Reviewed Labs: ordered.  Risk Prescription drug management.   Pt complains of dysuria for the past 4 days.  Also reports episodes of hematuria when urinating.  States he has "chunks of meat" coming out of his penis when urinating, and then has some relief after these pass.  He reports some intermittent back pain as well.   States he has not been sexually active for over a year, denies any concern for STIs.  Denies any penile discharge.  Denies any fevers or chills.  No nausea or vomiting no chest pain or abdominal pain  The differential diagnosis for hematuria includes but is not limited to cystitis, urinary calculi, BPH, renal cell carcinoma, transitional cell carcinoma, glomerulonephritis, polycystic kidney disease, anticoagulant usage, prostate cancer, papillary necrosis (EGD in sig  abuse, DM, sickle cell trait or disease), renal infarction, interstitial nephritis, medullary sponge kidney, radiation or chemical cystitis (e.g. Cyclophosphamide), atrophic vaginitis, schistosomiasis, menses, urethra his, urethral diverticula.  Urinalysis with evidence of UTI with greater than 50 WBCs bacteria present, moderate leukocytes large hemoglobin.  CT renal stone study without stones or any masses evident.  Creatinine  approximately baseline, CBC with mild leukocytosis consistent with UTI, urine culture obtained.  Patient denies any concern for STDs and I think unlikely given lack of penile discharge.  Recommend close follow-up with urology we will treat with Macrobid for UTI -in keeping with up-to-date recommendations.    Patient agreeable to plan.  Will discharge home at this time.  Final Clinical Impression(s) / ED Diagnoses Final diagnoses:  Acute cystitis with hematuria    Rx / DC Orders ED Discharge Orders          Ordered    nitrofurantoin, macrocrystal-monohydrate, (MACROBID) 100 MG capsule  2 times daily        10/03/23 1941              Gailen Shelter, [place] 10/03/23 2054    Virgina Norfolk, DO 10/03/23 2059

## 2023-10-03 NOTE — ED Triage Notes (Signed)
Approximately 4 days ago pt having pain when urinating.  The pain is severe when he finishes urinating and also blood.  He also reports having something come out when he was urinating.  Pt. Having intermittent nausea and lt. Flank pain.  Denies any Hx of kidney stones. Pt. Reports having a fever in the last 4 days 101.2

## 2023-10-05 LAB — URINE CULTURE: Culture: >=100000 — AB

## 2023-10-06 ENCOUNTER — Telehealth (HOSPITAL_BASED_OUTPATIENT_CLINIC_OR_DEPARTMENT_OTHER): Payer: Self-pay | Admitting: *Deleted

## 2023-10-06 NOTE — Telephone Encounter (Signed)
Post ED Visit - Positive Culture Follow-up  Culture report reviewed by antimicrobial stewardship pharmacist: [facility] []  Enzo Bi, Pharm.D. []  Celedonio Miyamoto, Pharm.D., BCPS AQ-ID []  Garvin Fila, Pharm.D., BCPS []  Georgina Pillion, Pharm.D., BCPS []  [place], [place].D., BCPS, AAHIVP []  Estella Husk, Pharm.D., BCPS, AAHIVP []  Lysle Pearl, PharmD, BCPS []  Phillips Climes, PharmD, BCPS []  Agapito Games, PharmD, BCPS []  Verlan Friends, PharmD []  Mervyn Gay, PharmD, BCPS [x]  Delmar Landau, PharmD  [facility] []  Len Childs, PharmD []  Greer Pickerel, PharmD []  Adalberto Cole, PharmD []  Perlie Gold, Rph []  Lonell Face) Jean Rosenthal, PharmD []  Earl Many, PharmD []  Junita Push, PharmD []  Dorna Leitz, PharmD []  [name], PharmD []  Lynann Beaver, PharmD []  Keturah Barre, PharmD []  Loralee Pacas, PharmD []  Bernadene Person, PharmD   Positive urine culture Treated with Nitrofurantoin, organism sensitive to the same and no further patient follow-up is required at this time.  Patsey Berthold 10/06/2023, 12:14 PM

## 2023-10-15 ENCOUNTER — Other Ambulatory Visit: Payer: Self-pay | Admitting: Student

## 2023-10-15 ENCOUNTER — Other Ambulatory Visit: Payer: Self-pay | Admitting: Gastroenterology

## 2023-10-15 DIAGNOSIS — K219 Gastro-esophageal reflux disease without esophagitis: Secondary | ICD-10-CM

## 2023-10-18 ENCOUNTER — Ambulatory Visit: Payer: MEDICAID | Admitting: Adult Health

## 2023-10-24 ENCOUNTER — Ambulatory Visit: Payer: MEDICAID | Admitting: Adult Health

## 2023-10-25 ENCOUNTER — Other Ambulatory Visit: Payer: Self-pay | Admitting: Student

## 2023-10-30 ENCOUNTER — Ambulatory Visit (INDEPENDENT_AMBULATORY_CARE_PROVIDER_SITE_OTHER): Payer: MEDICAID | Admitting: Student

## 2023-10-30 ENCOUNTER — Encounter: Payer: Self-pay | Admitting: Student

## 2023-10-30 VITALS — BP 116/74 | HR 96 | Temp 97.9°F | Ht 75.0 in | Wt 281.4 lb

## 2023-10-30 DIAGNOSIS — M5431 Sciatica, right side: Secondary | ICD-10-CM

## 2023-10-30 DIAGNOSIS — I152 Hypertension secondary to endocrine disorders: Secondary | ICD-10-CM

## 2023-10-30 DIAGNOSIS — Z7985 Long-term (current) use of injectable non-insulin antidiabetic drugs: Secondary | ICD-10-CM

## 2023-10-30 DIAGNOSIS — E1159 Type 2 diabetes mellitus with other circulatory complications: Secondary | ICD-10-CM

## 2023-10-30 DIAGNOSIS — Z7984 Long term (current) use of oral hypoglycemic drugs: Secondary | ICD-10-CM

## 2023-10-30 DIAGNOSIS — E1169 Type 2 diabetes mellitus with other specified complication: Secondary | ICD-10-CM

## 2023-10-30 DIAGNOSIS — Z6835 Body mass index (BMI) 35.0-35.9, adult: Secondary | ICD-10-CM

## 2023-10-30 DIAGNOSIS — E669 Obesity, unspecified: Secondary | ICD-10-CM

## 2023-10-30 LAB — POCT GLYCOSYLATED HEMOGLOBIN (HGB A1C): Hemoglobin A1C: 6.1 % — AB (ref 4.0–5.6)

## 2023-10-30 LAB — GLUCOSE, CAPILLARY: Glucose-Capillary: 145 mg/dL — ABNORMAL HIGH (ref 70–99)

## 2023-10-30 MED ORDER — GABAPENTIN 600 MG PO TABS: 600.0000 mg | ORAL_TABLET | Freq: Three times a day (TID) | ORAL | 5 refills | Status: DC | PRN

## 2023-10-30 MED ORDER — OZEMPIC (2 MG/DOSE) 8 MG/3ML ~~LOC~~ SOPN: 2.0000 mg | PEN_INJECTOR | SUBCUTANEOUS | 11 refills | Status: AC

## 2023-10-30 NOTE — Assessment & Plan Note (Signed)
 Weight is stable over time at 281 but he would like to lose some. Discussed diet and lifestyle. My recommendations today were to eat at home as often as possible, only shop for food on a full stomach, and to choose foods that you must prepare yourself rather than pre-prepared, which may reduce intake and favor healthier choices.

## 2023-10-30 NOTE — Assessment & Plan Note (Addendum)
 A1c 6.4 -> 6.1% on max metformin and 1mg /week Ozempic. Will maximize his Ozempic. He is tolerating it well. - Continue metformin 1000 BID - Increase Ozempic 1 -> 2mg /week - ACR today

## 2023-10-30 NOTE — Assessment & Plan Note (Signed)
 Continues to have neuropathic pain shooting down the R leg. Straight leg + today on R. Denies weakness and when I assess him I cannot appreciate any. He states gabapentin helps. I wonder if one day he would benefit from injections. But for now he has room to increase his gabapentin, which he tolerates. - Increase Gabapentin 300 -> 600 TID

## 2023-10-30 NOTE — Progress Notes (Signed)
   CC: FU chronic issues T2DM, HTN, pain  HPI:  [name] is a 48 y.o. male with a history stated below and presents today for checkup. Please see problem based assessment and plan for additional details.  Past Medical History:  Diagnosis Date   Anxiety disorder    At risk for obstructive sleep apnea 04/15/2018   Bipolar 1 disorder (HCC)    Depression    Hepatitis B immune 04/15/2018   HLD (hyperlipidemia)    Hypertension    Schizophrenia (HCC)     Review of Systems: ROS negative except for what is noted on the assessment and plan.  Vitals:   10/30/23 1458  BP: 116/74  Pulse: 96  Temp: 97.9 F (36.6 C)  TempSrc: Oral  SpO2: 99%  Weight: 281 lb 6.4 oz (127.6 kg)  Height: 6\' 3"  (1.905 m)    Physical Exam: Constitutional: well-appearing man in no acute distress Cardiovascular: regular rate and rhythm, no m/r/g Pulmonary/Chest: normal work of breathing on room air, lungs clear to auscultation bilaterally Abdominal: soft, non-tender, non-distended MSK: normal bulk and tone Neurological: alert & oriented x 3. Straight leg + on R. Skin: warm and dry Psych: normal mood and behavior  Assessment & Plan:   Patient discussed with Dr. Mayford Knife  Sciatica Continues to have neuropathic pain shooting down the R leg. Straight leg + today on R. Denies weakness and when I assess him I cannot appreciate any. He states gabapentin helps. I wonder if one day he would benefit from injections. But for now he has room to increase his gabapentin, which he tolerates. - Increase Gabapentin 300 -> 600 TID  Obesity, diabetes, and hypertension syndrome (HCC) A1c 6.4 -> 6.1% on max metformin and 1mg /week Ozempic. Will maximize his Ozempic. He is tolerating it well. - Continue metformin 1000 BID - Increase Ozempic 1 -> 2mg /week - ACR today  Morbid obesity (HCC) Weight is stable over time at 281 but he would like to lose some. Discussed diet and lifestyle. My recommendations today were  to eat at home as often as possible, only shop for food on a full stomach, and to choose foods that you must prepare yourself rather than pre-prepared, which may reduce intake and favor healthier choices.  RTC in 3 months for A1c re maximized ozempic. HTN on lisinopril and metoprolol. Chronic pain with increased gabapentin. In interim he will see GI for GERD with plans for a scope.  Bruce [name], D.O. [facility] Internal Medicine, PGY-1 Phone: [phone] Date 10/30/2023 Time 4:00 PM

## 2023-10-31 LAB — MICROALBUMIN / CREATININE URINE RATIO
Creatinine, Urine: 705.9 mg/dL
Microalb/Creat Ratio: 4 mg/g{creat} (ref 0–29)
Microalbumin, Urine: 31.2 ug/mL

## 2023-11-01 ENCOUNTER — Encounter: Payer: Self-pay | Admitting: Student

## 2023-11-06 NOTE — Progress Notes (Signed)
 Internal Medicine Clinic Attending  Case discussed with the resident at the time of the visit.  We reviewed the resident's history and exam and pertinent patient test results.  I agree with the assessment, diagnosis, and plan of care documented in the resident's note.

## 2023-11-13 ENCOUNTER — Encounter: Payer: MEDICAID | Admitting: Gastroenterology

## 2023-11-15 ENCOUNTER — Encounter: Payer: MEDICAID | Admitting: Gastroenterology

## 2023-12-06 ENCOUNTER — Other Ambulatory Visit: Payer: Self-pay | Admitting: Student

## 2023-12-06 DIAGNOSIS — I10 Essential (primary) hypertension: Secondary | ICD-10-CM

## 2023-12-10 ENCOUNTER — Ambulatory Visit: Payer: MEDICAID | Admitting: Gastroenterology

## 2023-12-10 ENCOUNTER — Encounter: Payer: Self-pay | Admitting: Gastroenterology

## 2023-12-10 VITALS — BP 145/77 | HR 74 | Temp 98.2°F | Resp 11 | Ht 77.0 in | Wt 277.0 lb

## 2023-12-10 DIAGNOSIS — D125 Benign neoplasm of sigmoid colon: Secondary | ICD-10-CM

## 2023-12-10 DIAGNOSIS — K317 Polyp of stomach and duodenum: Secondary | ICD-10-CM

## 2023-12-10 DIAGNOSIS — D123 Benign neoplasm of transverse colon: Secondary | ICD-10-CM

## 2023-12-10 DIAGNOSIS — K319 Disease of stomach and duodenum, unspecified: Secondary | ICD-10-CM | POA: Diagnosis not present

## 2023-12-10 DIAGNOSIS — D120 Benign neoplasm of cecum: Secondary | ICD-10-CM

## 2023-12-10 DIAGNOSIS — R109 Unspecified abdominal pain: Secondary | ICD-10-CM

## 2023-12-10 DIAGNOSIS — D122 Benign neoplasm of ascending colon: Secondary | ICD-10-CM

## 2023-12-10 DIAGNOSIS — K2289 Other specified disease of esophagus: Secondary | ICD-10-CM

## 2023-12-10 DIAGNOSIS — R1310 Dysphagia, unspecified: Secondary | ICD-10-CM

## 2023-12-10 DIAGNOSIS — B3781 Candidal esophagitis: Secondary | ICD-10-CM | POA: Diagnosis not present

## 2023-12-10 DIAGNOSIS — Z1211 Encounter for screening for malignant neoplasm of colon: Secondary | ICD-10-CM

## 2023-12-10 DIAGNOSIS — K219 Gastro-esophageal reflux disease without esophagitis: Secondary | ICD-10-CM

## 2023-12-10 DIAGNOSIS — Q438 Other specified congenital malformations of intestine: Secondary | ICD-10-CM

## 2023-12-10 DIAGNOSIS — D124 Benign neoplasm of descending colon: Secondary | ICD-10-CM

## 2023-12-10 MED ORDER — SODIUM CHLORIDE 0.9 % IV SOLN: 500.0000 mL | Freq: Once | INTRAVENOUS | Status: DC

## 2023-12-10 NOTE — Progress Notes (Signed)
 [facility] History and Physical   Primary Care Physician:  Carleen Chary, DO   Reason for Procedure:   GERD, dysphagia, colon cancer screening  Plan:    EGD, colonoscopy     HPI: Bruce [name] is a 48 y.o. male undergoing initial average risk screening colonoscopy and EGD to evaluate chronic symptoms of excessive belching, heartburn and dysphagia which have not responded to twice daily pantoprazole .  He has no family history of colon cancer (other than uncle) and no chronic GI symptoms.    Past Medical History:  Diagnosis Date   Anxiety disorder    At risk for obstructive sleep apnea 04/15/2018   Bipolar 1 disorder (HCC)    COPD (chronic obstructive pulmonary disease) (HCC)    Depression    Hepatitis B immune 04/15/2018   HLD (hyperlipidemia)    Hypertension    Schizophrenia (HCC)     Past Surgical History:  Procedure Laterality Date   wisdom tooth removal      Prior to Admission medications   Medication Sig Start Date End Date Taking? Authorizing Provider  albuterol  (VENTOLIN  HFA) 108 (90 Base) MCG/ACT inhaler Inhale 2 puffs into the lungs every 6 (six) hours as needed for wheezing or shortness of breath. 09/28/22  Yes Mapp, Tavien, MD  atorvastatin  (LIPITOR) 40 MG tablet TAKE 1 TABLET (40 MG TOTAL) BY MOUTH DAILY. (BEDTIME) 09/18/23  Yes Juberg, Christopher, DO  blood glucose meter kit and supplies Dispense based on patient and insurance preference. Use up to four times daily as directed. (FOR ICD-10 E10.9, E11.9). 06/17/23  Yes Nooruddin, Saad, MD  dicyclomine  (BENTYL ) 20 MG tablet TAKE 1 TABLET BY MOUTH EVERY 6 (SIX) HOURS. (AM+NOON+PM+BEDTIME) 12/10/23  Yes Juberg, Christopher, DO  famotidine  (PEPCID ) 20 MG tablet TAKE 1 TABLET (20 MG TOTAL) BY MOUTH 2 (TWO) TIMES DAILY.(AM+PM) 10/15/23  Yes Carleen Chary, DO  gabapentin  (NEURONTIN ) 600 MG tablet Take 1 tablet (600 mg total) by mouth 3 (three) times daily as needed. 10/30/23 10/29/24 Yes Juberg,  Veryl Gottron, DO  hydrOXYzine (ATARAX/VISTARIL) 25 MG tablet Take 25 mg by mouth 2 (two) times daily as needed. 02/16/21  Yes [provider]  lamoTRIgine (LAMICTAL) 100 MG tablet Take 100 mg by mouth daily. 02/16/21  Yes [provider]  lisinopril  (ZESTRIL ) 20 MG tablet TAKE 1 TABLET (20 MG TOTAL) BY MOUTH DAILY. (AM) 12/10/23  Yes Carleen Chary, DO  metFORMIN  (GLUCOPHAGE ) 1000 MG tablet Take 1 tablet (1,000 mg total) by mouth 2 (two) times daily with a meal. 08/23/23  Yes Juberg, Christopher, DO  metoprolol  succinate (TOPROL  XL) 25 MG 24 hr tablet Take 1 tablet (25 mg total) by mouth daily. 12/31/22 12/31/23 Yes Sridharan, Sriramkumar, MD  mirtazapine  (REMERON ) 15 MG tablet Take 1 tablet (15 mg total) by mouth at bedtime. 12/31/22  Yes Sridharan, Sriramkumar, MD  OZEMPIC , 2 MG/DOSE, 8 MG/3ML SOPN Inject 2 mg into the skin once a week. 11/25/23  Yes [provider]  pantoprazole  (PROTONIX ) 40 MG tablet TAKE 1 TABLET BY MOUTH 2 (TWO) TIMES DAILY (AM+PM) 10/16/23  Yes Elois Hair, MD  prazosin  (MINIPRESS ) 1 MG capsule Take 1 capsule (1 mg total) by mouth at bedtime. 05/29/23  Yes Juberg, Veryl Gottron, DO  risperiDONE (RISPERDAL) 0.5 MG tablet Take 0.5 mg by mouth at bedtime. 09/18/23  Yes [provider]  senna (SENOKOT) 8.6 MG TABS tablet Take 1 tablet (8.6 mg total) by mouth daily as needed for mild constipation. 06/09/21  Yes Elois Hair, MD  sertraline  (ZOLOFT )  100 MG tablet Take 150 mg by mouth daily. 02/16/21  Yes [provider]  gabapentin  (NEURONTIN ) 300 MG capsule Take 1 capsule (300 mg total) by mouth 3 (three) times daily. Patient not taking: Reported on 12/10/2023 08/23/23   Carleen Chary, DO  ibuprofen  (ADVIL ) 800 MG tablet Take 800 mg by mouth every 6 (six) hours as needed. Patient not taking: Reported on 12/10/2023 08/22/23   [provider]  nitroGLYCERIN  (NITROSTAT ) 0.4 MG SL tablet Place 1 tablet (0.4 mg total) under the  tongue every 5 (five) minutes as needed for chest pain. Patient not taking: Reported on 12/10/2023 05/01/23 04/30/24  Carleen Chary, DO    Current Outpatient Medications  Medication Sig Dispense Refill   albuterol  (VENTOLIN  HFA) 108 (90 Base) MCG/ACT inhaler Inhale 2 puffs into the lungs every 6 (six) hours as needed for wheezing or shortness of breath. 8 g 4   atorvastatin  (LIPITOR) 40 MG tablet TAKE 1 TABLET (40 MG TOTAL) BY MOUTH DAILY. (BEDTIME) 90 tablet 1   blood glucose meter kit and supplies Dispense based on patient and insurance preference. Use up to four times daily as directed. (FOR ICD-10 E10.9, E11.9). 1 each 0   dicyclomine  (BENTYL ) 20 MG tablet TAKE 1 TABLET BY MOUTH EVERY 6 (SIX) HOURS. (AM+NOON+PM+BEDTIME) 120 tablet 2   famotidine  (PEPCID ) 20 MG tablet TAKE 1 TABLET (20 MG TOTAL) BY MOUTH 2 (TWO) TIMES DAILY.(AM+PM) 60 tablet 2   gabapentin  (NEURONTIN ) 600 MG tablet Take 1 tablet (600 mg total) by mouth 3 (three) times daily as needed. 30 tablet 5   hydrOXYzine (ATARAX/VISTARIL) 25 MG tablet Take 25 mg by mouth 2 (two) times daily as needed.     lamoTRIgine (LAMICTAL) 100 MG tablet Take 100 mg by mouth daily.     lisinopril  (ZESTRIL ) 20 MG tablet TAKE 1 TABLET (20 MG TOTAL) BY MOUTH DAILY. (AM) 90 tablet 0   metFORMIN  (GLUCOPHAGE ) 1000 MG tablet Take 1 tablet (1,000 mg total) by mouth 2 (two) times daily with a meal. 60 tablet 10   metoprolol  succinate (TOPROL  XL) 25 MG 24 hr tablet Take 1 tablet (25 mg total) by mouth daily. 30 tablet 0   mirtazapine  (REMERON ) 15 MG tablet Take 1 tablet (15 mg total) by mouth at bedtime. 30 tablet 0   OZEMPIC , 2 MG/DOSE, 8 MG/3ML SOPN Inject 2 mg into the skin once a week.     pantoprazole  (PROTONIX ) 40 MG tablet TAKE 1 TABLET BY MOUTH 2 (TWO) TIMES DAILY (AM+PM) 90 tablet 3   prazosin  (MINIPRESS ) 1 MG capsule Take 1 capsule (1 mg total) by mouth at bedtime. 30 capsule 0   risperiDONE (RISPERDAL) 0.5 MG tablet Take 0.5 mg by mouth at  bedtime.     senna (SENOKOT) 8.6 MG TABS tablet Take 1 tablet (8.6 mg total) by mouth daily as needed for mild constipation. 30 tablet 3   sertraline  (ZOLOFT ) 100 MG tablet Take 150 mg by mouth daily.     gabapentin  (NEURONTIN ) 300 MG capsule Take 1 capsule (300 mg total) by mouth 3 (three) times daily. (Patient not taking: Reported on 12/10/2023) 90 capsule 10   ibuprofen  (ADVIL ) 800 MG tablet Take 800 mg by mouth every 6 (six) hours as needed. (Patient not taking: Reported on 12/10/2023)     nitroGLYCERIN  (NITROSTAT ) 0.4 MG SL tablet Place 1 tablet (0.4 mg total) under the tongue every 5 (five) minutes as needed for chest pain. (Patient not taking: Reported on 12/10/2023) 100 tablet 3  Current Facility-Administered Medications  Medication Dose Route Frequency Provider Last Rate Last Admin   0.9 %  sodium chloride  infusion  500 mL Intravenous Once Elois Hair, MD        Allergies as of 12/10/2023   (No Known Allergies)    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  Tobacco Use   Smoking status: Former    Current packs/day: 0.00    Types: Cigarettes    Quit date: 05/09/2021    Years since quitting: 2.5   Smokeless tobacco: Never   Tobacco comments:    previous marijuana smoker  Vaping Use   Vaping status: Never Used  Substance and Sexual Activity   Alcohol use: Not Currently    Drug use: Not Currently    Types: Marijuana   Sexual activity: Yes    Partners: Female  Other Topics Concern   Not on file  Social History Narrative   Not on file   Social Drivers of Health   Financial Resource Strain: Not on file  Food Insecurity: No Food Insecurity (09/28/2022)   Hunger Vital Sign    Worried About Running Out of Food in the Last Year: Never true    Ran Out of Food in the Last Year: Never true  Transportation Needs: No Transportation Needs (09/28/2022)   PRAPARE - Administrator, Civil Service (Medical): No    Lack of Transportation (Non-Medical): No  Physical Activity: Not on file  Stress: Not on file  Social Connections: Socially Isolated (09/28/2022)   Social Connection and Isolation Panel [NHANES]    Frequency of Communication with Friends and Family: Once a week    Frequency of Social Gatherings with Friends and Family: Never    Attends Religious Services: Never    Database administrator or Organizations: No    Attends Banker Meetings: Never    Marital Status: Separated  Intimate Partner Violence: Not At Risk (09/28/2022)   Humiliation, Afraid, Rape, and Kick questionnaire    Fear of Current or Ex-Partner: No    Emotionally Abused: No    Physically Abused: No    Sexually Abused: No    Review of Systems:  All other review of systems negative except as mentioned in the HPI.  Physical Exam: Vital signs BP (!) 143/86   Pulse 79   Temp 98.2 F (36.8 C) (Temporal)   Resp 16   Ht 6\' 5"  (1.956 m)   Wt 277 lb (125.6 kg)   SpO2 100%   BMI 32.85 kg/m   General:   Alert,  Well-developed, well-nourished, pleasant and cooperative in NAD Airway:  Mallampati 3 Lungs:  Clear throughout to auscultation.  Heart:  Regular rate and rhythm; no murmurs, clicks, rubs,  or gallops. Abdomen:  Soft, nontender and nondistended. Normal bowel sounds.   Neuro/Psych:  Normal mood and affect. A and O x 3   Denetria Luevanos E. Cherryl Corona, MD [facility]  Gastroenterology

## 2023-12-10 NOTE — Progress Notes (Unsigned)
 Report to PACU, RN, vss, BBS= Clear.

## 2023-12-10 NOTE — Patient Instructions (Signed)
 Please read handouts provided. Continue present medications. Await pathology results. Resume previous diet. Repeat colonoscopy for screening based on pathology results, but should not exceed one year. Refer to a Runner, broadcasting/film/video.   YOU HAD AN ENDOSCOPIC PROCEDURE TODAY AT THE [facility]:   Refer to the procedure report that was given to you for any specific questions about what was found during the examination.  If the procedure report does not answer your questions, please call your gastroenterologist to clarify.  If you requested that your care partner not be given the details of your procedure findings, then the procedure report has been included in a sealed envelope for you to review at your convenience later.  YOU SHOULD EXPECT: Some feelings of bloating in the abdomen. Passage of more gas than usual.  Walking can help get rid of the air that was put into your GI tract during the procedure and reduce the bloating. If you had a lower endoscopy (such as a colonoscopy or flexible sigmoidoscopy) you may notice spotting of blood in your stool or on the toilet paper. If you underwent a bowel prep for your procedure, you may not have a normal bowel movement for a few days.  Please Note:  You might notice some irritation and congestion in your nose or some drainage.  This is from the oxygen used during your procedure.  There is no need for concern and it should clear up in a day or so.  SYMPTOMS TO REPORT IMMEDIATELY:  Following lower endoscopy (colonoscopy or flexible sigmoidoscopy):  Excessive amounts of blood in the stool  Significant tenderness or worsening of abdominal pains  Swelling of the abdomen that is new, acute  Fever of 100F or higher  Following upper endoscopy (EGD)  Vomiting of blood or coffee ground material  New chest pain or pain under the shoulder blades  Painful or persistently difficult swallowing  New shortness of breath  Fever of 100F or  higher  Black, tarry-looking stools  For urgent or emergent issues, a gastroenterologist can be reached at any hour by calling [phone]. Do not use MyChart messaging for urgent concerns.    DIET:  We do recommend a small meal at first, but then you may proceed to your regular diet.  Drink plenty of fluids but you should avoid alcoholic beverages for 24 hours.  ACTIVITY:  You should plan to take it easy for the rest of today and you should NOT DRIVE or use heavy machinery until tomorrow (because of the sedation medicines used during the test).    FOLLOW UP: Our staff will call the number listed on your records the next business day following your procedure.  We will call around 7:15- 8:00 am to check on you and address any questions or concerns that you may have regarding the information given to you following your procedure. If we do not reach you, we will leave a message.     If any biopsies were taken you will be contacted by phone or by letter within the next 1-3 weeks.  Please call us  at [phone] if you have not heard about the biopsies in 3 weeks.    SIGNATURES/CONFIDENTIALITY: You and/or your care partner have signed paperwork which will be entered into your electronic medical record.  These signatures attest to the fact that that the information above on your After Visit Summary has been reviewed and is understood.  Full responsibility of the confidentiality of this discharge information lies with you  and/or your care-partner.

## 2023-12-10 NOTE — Op Note (Signed)
 [facility] Patient Name: Bruce [name] Procedure Date: 12/10/2023 3:28 PM MRN: [id number] Endoscopist: Geralyn Knee E. Cherryl Corona , MD, [id number] Age: 48 Referring MD:  Date of Birth: 08-21-1975 Gender: Male Account #: [id number] Procedure:                Upper GI endoscopy Indications:              Dysphagia, Esophageal reflux symptoms that persist                            despite appropriate therapy, Abdominal bloating Medicines:                Monitored Anesthesia Care Procedure:                Pre-Anesthesia Assessment:                           - Prior to the procedure, a History and Physical                            was performed, and patient medications and                            allergies were reviewed. The patient's tolerance of                            previous anesthesia was also reviewed. The risks                            and benefits of the procedure and the sedation                            options and risks were discussed with the patient.                            All questions were answered, and informed consent                            was obtained. Prior Anticoagulants: The patient has                            taken no anticoagulant or antiplatelet agents. ASA                            Grade Assessment: III - A patient with severe                            systemic disease. After reviewing the risks and                            benefits, the patient was deemed in satisfactory                            condition to undergo the procedure.  After obtaining informed consent, the endoscope was                            passed under direct vision. Throughout the                            procedure, the patient's blood pressure, pulse, and                            oxygen saturations were monitored continuously. The                            Olympus Scope SN [id number] was introduced through the                             mouth, and advanced to the second part of duodenum.                            The upper GI endoscopy was accomplished without                            difficulty. The patient tolerated the procedure                            well. Scope In: Scope Out: Findings:                 Patchy, white plaques were found in the entire                            esophagus. Biopsies were obtained from the proximal                            and distal esophagus with cold forceps for                            histology to evaluate for esophageal candidiasis                            and eosinophilic esophagitis. Estimated blood loss                            was minimal.                           The exam of the esophagus was otherwise normal.                           A guidewire was placed and the scope was withdrawn.                            Dilation was performed in the lower third of the                            esophagus with  a Savary dilator with no resistance                            at 18 mm. Estimated blood loss: none.                           A single 15 mm sessile polyp was found in the                            gastric fundus. Biopsies were taken with a cold                            forceps for histology. Estimated blood loss was                            minimal.                           The exam of the stomach was otherwise normal.                           Biopsies were taken with a cold forceps in the                            gastric antrum for Helicobacter pylori testing.                            Estimated blood loss was minimal.                           The examined duodenum was normal. Complications:            No immediate complications. Estimated Blood Loss:     Estimated blood loss was minimal. Impression:               - Esophageal plaques were found, doubt candidiasis.                           - A single gastric polyp. Biopsied.                            - Normal examined duodenum.                           - Biopsies were taken with a cold forceps for                            evaluation of eosinophilic esophagitis.                           - Dilation performed in the lower third of the                            esophagus. No esophageal stricture or stenosis  appreciated.                           - Biopsies were taken with a cold forceps for                            Helicobacter pylori testing. Recommendation:           - Patient has a contact number available for                            emergencies. The signs and symptoms of potential                            delayed complications were discussed with the                            patient. Return to normal activities tomorrow.                            Written discharge instructions were provided to the                            patient.                           - Resume previous diet.                           - Continue present medications.                           - Await pathology results.                           - Further recommendations will be based on                            pathology results. Onesty Clair E. Cherryl Corona, MD 12/10/2023 5:12:48 PM This report has been signed electronically.

## 2023-12-10 NOTE — Progress Notes (Signed)
 Called to room to assist during endoscopic procedure.  Patient ID and intended procedure confirmed with present staff. Received instructions for my participation in the procedure from the performing physician.

## 2023-12-10 NOTE — Op Note (Signed)
 [facility] Patient Name: Bruce [name] Procedure Date: 12/10/2023 3:18 PM MRN: [id number] Endoscopist: Geralyn Knee E. Cherryl Corona , MD, [id number] Age: 48 Referring MD:  Date of Birth: 04-02-1976 Gender: Male Account #: [id number] Procedure:                Colonoscopy Indications:              Screening for colorectal malignant neoplasm, This                            is the patient's first colonoscopy Medicines:                Monitored Anesthesia Care Procedure:                Pre-Anesthesia Assessment:                           - Prior to the procedure, a History and Physical                            was performed, and patient medications and                            allergies were reviewed. The patient's tolerance of                            previous anesthesia was also reviewed. The risks                            and benefits of the procedure and the sedation                            options and risks were discussed with the patient.                            All questions were answered, and informed consent                            was obtained. Prior Anticoagulants: The patient has                            taken no anticoagulant or antiplatelet agents. ASA                            Grade Assessment: III - A patient with severe                            systemic disease. After reviewing the risks and                            benefits, the patient was deemed in satisfactory                            condition to undergo the procedure.  After obtaining informed consent, the colonoscope                            was passed under direct vision. Throughout the                            procedure, the patient's blood pressure, pulse, and                            oxygen saturations were monitored continuously. The                            Olympus Scope SN [id number] was introduced through the                            anus and  advanced to the the cecum, identified by                            appendiceal orifice and ileocecal valve. The                            colonoscopy was performed with moderate difficulty                            due to inadequate bowel prep, significant looping                            and a tortuous colon, as well as prominent                            diaphragmatic breathing resulting in significant                            colon mobility during polypectomy. Successful                            completion of the procedure was aided by using                            manual pressure. The patient tolerated the                            procedure well. The quality of the bowel                            preparation was fair. The ileocecal valve,                            appendiceal orifice, and rectum were photographed.                            The bowel preparation used was SUFLAVE  via split  dose instruction. Scope In: 4:04:58 PM Scope Out: 5:01:33 PM Scope Withdrawal Time: 0 hours 46 minutes 34 seconds  Total Procedure Duration: 0 hours 56 minutes 35 seconds  Findings:                 The perianal and digital rectal examinations were                            normal. Pertinent negatives include normal                            sphincter tone and no palpable rectal lesions.                           Four sessile polyps were found in the cecum. The                            polyps were 2 to 4 mm in size. These polyps were                            removed with a cold snare. Resection and retrieval                            were complete. Estimated blood loss was minimal.                           15 sessile and semi-pedunculated polyps were found                            in the ascending colon. The polyps were 2 to 12 mm                            in size. These polyps were removed with a cold                            snare. Resection  and retrieval were complete.                            Estimated blood loss was minimal.                           12 sessile polyps were found in the hepatic                            flexure. The polyps were 3 to 8 mm in size. These                            polyps were removed with a cold snare. Resection                            and retrieval were complete. Estimated blood loss  was minimal.                           12 sessile polyps were found in the transverse                            colon. The polyps were 2 to 10 mm in size. These                            polyps were removed with a cold snare. Resection                            and retrieval were complete. Estimated blood loss                            was minimal.                           A 5 mm polyp was found in the descending colon. The                            polyp was sessile. The polyp was removed with a                            cold snare. Resection and retrieval were complete.                            Estimated blood loss was minimal.                           A 9 mm polyp was found in the sigmoid colon. The                            polyp was pedunculated. The polyp was removed with                            a cold snare. Resection and retrieval were                            complete. Estimated blood loss was minimal.                           A 14 mm polyp was found in the sigmoid colon. The                            polyp was pedunculated. The polyp was removed with                            a hot snare. Resection and retrieval were complete.                            Estimated blood loss: none.  The exam was otherwise normal throughout the                            examined colon.                           The retroflexed view of the distal rectum and anal                            verge was normal and showed no anal or rectal                             abnormalities. Complications:            No immediate complications. Estimated Blood Loss:     Estimated blood loss was minimal. Impression:               - Preparation of the colon was fair.                           - Four 2 to 4 mm polyps in the cecum, removed with                            a cold snare. Resected and retrieved.                           - Fifteen 2 to 12 mm polyps in the ascending colon,                            removed with a cold snare. Resected and retrieved.                           - Twelve 3 to 8 mm polyps at the hepatic flexure,                            removed with a cold snare. Resected and retrieved.                           - Twelve 2 to 10 mm polyps in the transverse colon,                            removed with a cold snare. Resected and retrieved.                           - One 5 mm polyp in the descending colon, removed                            with a cold snare. Resected and retrieved.                           - One 9 mm polyp in the sigmoid colon, removed with  a cold snare. Resected and retrieved.                           - One 14 mm polyp in the sigmoid colon, removed                            with a hot snare. Resected and retrieved.                           - The distal rectum and anal verge are normal on                            retroflexion view.                           - Extensive number of polyps is high suggestive of                            a polyposis syndrome/genetic mutation Recommendation:           - Patient has a contact number available for                            emergencies. The signs and symptoms of potential                            delayed complications were discussed with the                            patient. Return to normal activities tomorrow.                            Written discharge instructions were provided to the                            patient.                            - Resume previous diet.                           - Continue present medications.                           - Await pathology results.                           - Repeat colonoscopy (date not yet determined) for                            surveillance based on pathology results, but should                            not exceed one year.                           -  Recommend extended/augmented bowel prep with next                            colonoscopy given borderline prep quality today.                           - Refer to a genetics counselor at appointment to                            be scheduled to be tested for familial adenomatous                            polyposis syndrome (FAP) and other mutations. Gwendelyn Lanting E. Cherryl Corona, MD 12/10/2023 5:28:43 PM This report has been signed electronically.

## 2023-12-11 ENCOUNTER — Telehealth: Payer: Self-pay

## 2023-12-11 NOTE — Telephone Encounter (Signed)
 Post procedure follow up call, no answer

## 2023-12-12 ENCOUNTER — Other Ambulatory Visit: Payer: Self-pay

## 2023-12-12 ENCOUNTER — Encounter: Payer: Self-pay | Admitting: Adult Health

## 2023-12-12 ENCOUNTER — Ambulatory Visit: Payer: MEDICAID | Admitting: Adult Health

## 2023-12-12 VITALS — BP 111/74 | HR 92 | Ht 77.0 in | Wt 272.2 lb

## 2023-12-12 DIAGNOSIS — G4733 Obstructive sleep apnea (adult) (pediatric): Secondary | ICD-10-CM | POA: Diagnosis not present

## 2023-12-12 DIAGNOSIS — Z6832 Body mass index (BMI) 32.0-32.9, adult: Secondary | ICD-10-CM

## 2023-12-12 DIAGNOSIS — D1391 Familial adenomatous polyposis: Secondary | ICD-10-CM

## 2023-12-12 NOTE — Progress Notes (Signed)
 follow-up  @Patient  ID: Bruce [name], male    DOB: 1975/10/20, 48 y.o.   MRN: [id number]  Chief Complaint  Patient presents with   Follow-up    Referring provider: Carleen Chary, DO  HPI: 48 year old male seen for sleep consult May 02, 2023 for snoring and witnessed apneic events found to have severe obstructive sleep apnea Medical history significant for hypertension, asthma, emphysema, lung nodules diabetes, hyperlipidemia, chronic allergies, headaches, anxiety, depression, bipolar disorder, schizophrenia  TEST/EVENTS :  HST May 13, 2023 showed severe sleep apnea with AHI at 62.3/hour SpO2 low at 67    12/12/2023 Follow up ; OSA  Patient presents for 78-month follow-up.  Patient was seen last visit to discuss sleep study results that were done on September 2024 that showed severe sleep apnea with AHI of 62/hour and SpO2 low at 67%.  Patient was started on CPAP therapy.  Since last visit patient has had trouble tolerating. Did well on it initially but mask started to irritate his skin along his nose and mouth. Pressure feels too low at times.  Using a full face mask. Feels better when he wears CPAP . Feels he benefits from CPAP with decreased sleepiness.  CPAP download showed excellent compliance 2 months ago with daily usage at 8 hours.  Patient is on auto CPAP 5 to 20 cm H2O.  AHI 7.9/hour.  Over the last 30 days has had minimal use.  No Known Allergies  Immunization History  Administered Date(s) Administered   Influenza, Seasonal, Injecte, Preservative Fre 05/22/2023   Influenza,inj,Quad PF,6+ Mos 04/15/2018, 09/14/2022   PPD Test 10/13/2019   Tdap 09/14/2022    Past Medical History:  Diagnosis Date   Anxiety disorder    At risk for obstructive sleep apnea 04/15/2018   Bipolar 1 disorder (HCC)    COPD (chronic obstructive pulmonary disease) (HCC)    Depression    Hepatitis B immune 04/15/2018   HLD (hyperlipidemia)    Hypertension    Schizophrenia (HCC)      Tobacco History: Social History   Tobacco Use  Smoking Status Former   Current packs/day: 0.00   Types: Cigarettes   Quit date: 05/09/2021   Years since quitting: 2.5  Smokeless Tobacco Never  Tobacco Comments   previous marijuana smoker   Counseling given: Not Answered Tobacco comments: previous marijuana smoker   Outpatient Medications Prior to Visit  Medication Sig Dispense Refill   albuterol  (VENTOLIN  HFA) 108 (90 Base) MCG/ACT inhaler Inhale 2 puffs into the lungs every 6 (six) hours as needed for wheezing or shortness of breath. 8 g 4   atorvastatin  (LIPITOR) 40 MG tablet TAKE 1 TABLET (40 MG TOTAL) BY MOUTH DAILY. (BEDTIME) 90 tablet 1   blood glucose meter kit and supplies Dispense based on patient and insurance preference. Use up to four times daily as directed. (FOR ICD-10 E10.9, E11.9). 1 each 0   dicyclomine  (BENTYL ) 20 MG tablet TAKE 1 TABLET BY MOUTH EVERY 6 (SIX) HOURS. (AM+NOON+PM+BEDTIME) 120 tablet 2   famotidine  (PEPCID ) 20 MG tablet TAKE 1 TABLET (20 MG TOTAL) BY MOUTH 2 (TWO) TIMES DAILY.(AM+PM) 60 tablet 2   gabapentin  (NEURONTIN ) 300 MG capsule Take 1 capsule (300 mg total) by mouth 3 (three) times daily. 90 capsule 10   gabapentin  (NEURONTIN ) 600 MG tablet Take 1 tablet (600 mg total) by mouth 3 (three) times daily as needed. 30 tablet 5   hydrOXYzine (ATARAX/VISTARIL) 25 MG tablet Take 25 mg by mouth 2 (two) times daily as needed.  ibuprofen  (ADVIL ) 800 MG tablet Take 800 mg by mouth every 6 (six) hours as needed.     lamoTRIgine (LAMICTAL) 100 MG tablet Take 100 mg by mouth daily.     lisinopril  (ZESTRIL ) 20 MG tablet TAKE 1 TABLET (20 MG TOTAL) BY MOUTH DAILY. (AM) 90 tablet 0   metFORMIN  (GLUCOPHAGE ) 1000 MG tablet Take 1 tablet (1,000 mg total) by mouth 2 (two) times daily with a meal. 60 tablet 10   metoprolol  succinate (TOPROL  XL) 25 MG 24 hr tablet Take 1 tablet (25 mg total) by mouth daily. 30 tablet 0   mirtazapine  (REMERON ) 15 MG tablet Take  1 tablet (15 mg total) by mouth at bedtime. 30 tablet 0   nitroGLYCERIN  (NITROSTAT ) 0.4 MG SL tablet Place 1 tablet (0.4 mg total) under the tongue every 5 (five) minutes as needed for chest pain. 100 tablet 3   OZEMPIC , 2 MG/DOSE, 8 MG/3ML SOPN Inject 2 mg into the skin once a week.     pantoprazole  (PROTONIX ) 40 MG tablet TAKE 1 TABLET BY MOUTH 2 (TWO) TIMES DAILY (AM+PM) 90 tablet 3   prazosin  (MINIPRESS ) 1 MG capsule Take 1 capsule (1 mg total) by mouth at bedtime. 30 capsule 0   risperiDONE (RISPERDAL) 0.5 MG tablet Take 0.5 mg by mouth at bedtime.     senna (SENOKOT) 8.6 MG TABS tablet Take 1 tablet (8.6 mg total) by mouth daily as needed for mild constipation. 30 tablet 3   sertraline  (ZOLOFT ) 100 MG tablet Take 150 mg by mouth daily.     Facility-Administered Medications Prior to Visit  Medication Dose Route Frequency Provider Last Rate Last Admin   0.9 %  sodium chloride  infusion  500 mL Intravenous Once Elois Hair, MD         Review of Systems:   Constitutional:   No  weight loss, night sweats,  Fevers, chills,+ fatigue, or  lassitude.  HEENT:   No headaches,  Difficulty swallowing,  Tooth/dental problems, or  Sore throat,                No sneezing, itching, ear ache, nasal congestion, post nasal drip,   CV:  No chest pain,  Orthopnea, PND, swelling in lower extremities, anasarca, dizziness, palpitations, syncope.   GI  No heartburn, indigestion, abdominal pain, nausea, vomiting, diarrhea, change in bowel habits, loss of appetite, bloody stools.   Resp: No shortness of breath with exertion or at rest.  No excess mucus, no productive cough,  No non-productive cough,  No coughing up of blood.  No change in color of mucus.  No wheezing.  No chest wall deformity  Skin: no rash or lesions.  GU: no dysuria, change in color of urine, no urgency or frequency.  No flank pain, no hematuria   MS:  No joint pain or swelling.  No decreased range of motion.  No back  pain.    Physical Exam  BP 111/74 (BP Location: Left Arm, Patient Position: Sitting, Cuff Size: Large)   Pulse 92   Ht 6\' 5"  (1.956 m)   Wt 272 lb 3.2 oz (123.5 kg)   SpO2 97%   BMI 32.28 kg/m   GEN: A/Ox3; pleasant , NAD, well nourished    HEENT:  [place]/AT,  NOSE-clear, THROAT-clear, no lesions, no postnasal drip or exudate noted.   NECK:  Supple w/ fair ROM; no JVD; normal carotid impulses w/o bruits; no thyromegaly or nodules palpated; no lymphadenopathy.    RESP  Clear  P & A; w/o, wheezes/ rales/ or rhonchi. no accessory muscle use, no dullness to percussion  CARD:  RRR, no m/r/g, no peripheral edema, pulses intact, no cyanosis or clubbing.  GI:   Soft & nt; nml bowel sounds; no organomegaly or masses detected.   Musco: Warm bil, no deformities or joint swelling noted.   Neuro: alert, no focal deficits noted.    Skin: Warm, no lesions or rashes    Lab Results:    BNP No results found for: "BNP"  ProBNP No results found for: "PROBNP"  Imaging: No results found.  Administration History     None          Latest Ref Rng & Units 12/17/2022    7:46 AM  PFT Results  FVC-Pre L 5.21  P  FVC-Predicted Pre % 80  P  FVC-Post L 5.53  P  FVC-Predicted Post % 85  P  Pre FEV1/FVC % % 72  P  Post FEV1/FCV % % 73  P  FEV1-Pre L 3.74  P  FEV1-Predicted Pre % 74  P  FEV1-Post L 4.05  P  DLCO uncorrected ml/min/mmHg 25.47  P  DLCO UNC% % 68  P  DLVA Predicted % 71  P  TLC L 8.49  P  TLC % Predicted % 101  P  RV % Predicted % 109  P    P Preliminary result    No results found for: "NITRICOXIDE"      Assessment & Plan:   OSA (obstructive sleep apnea) Patient had previous excellent compliance on CPAP.  Been having difficulty tolerating.  Will adjust CPAP pressure for comfort.  Also may try CPAP mask liners which may help with skin irritation.  Changed to auto CPAP 10 to 20 cm H2O. CPAP download in 1 month  Plan  Patient Instructions  Continue on  CPAP At bedtime, wear all night long for at least 6hr or more  CPAP pressure change  CPAP download in 4 weeks.  Can try to order online - CPAP mask liners .  Healthy sleep regimen  Do not drive if sleepy Work on healthy weight loss . Follow up in 6 months and As needed     Morbid obesity (HCC) Encouraged to work on healthy weight loss    Roena Clark, NP 12/12/2023

## 2023-12-12 NOTE — Assessment & Plan Note (Addendum)
 Encouraged to work on healthy weight loss

## 2023-12-12 NOTE — Assessment & Plan Note (Signed)
 Patient had previous excellent compliance on CPAP.  Been having difficulty tolerating.  Will adjust CPAP pressure for comfort.  Also may try CPAP mask liners which may help with skin irritation.  Changed to auto CPAP 10 to 20 cm H2O. CPAP download in 1 month  Plan  Patient Instructions  Continue on CPAP At bedtime, wear all night long for at least 6hr or more  CPAP pressure change  CPAP download in 4 weeks.  Can try to order online - CPAP mask liners .  Healthy sleep regimen  Do not drive if sleepy Work on healthy weight loss . Follow up in 6 months and As needed

## 2023-12-12 NOTE — Patient Instructions (Addendum)
 Continue on CPAP At bedtime, wear all night long for at least 6hr or more  CPAP pressure change  CPAP download in 4 weeks.  Can try to order online - CPAP mask liners .  Healthy sleep regimen  Do not drive if sleepy Work on healthy weight loss . Follow up in 6 months and As needed

## 2023-12-13 LAB — SURGICAL PATHOLOGY

## 2023-12-17 ENCOUNTER — Ambulatory Visit (INDEPENDENT_AMBULATORY_CARE_PROVIDER_SITE_OTHER): Payer: MEDICAID | Admitting: Student

## 2023-12-17 ENCOUNTER — Other Ambulatory Visit: Payer: Self-pay

## 2023-12-17 VITALS — BP 118/66 | HR 92 | Temp 98.0°F | Ht 77.0 in | Wt 273.3 lb

## 2023-12-17 DIAGNOSIS — N289 Disorder of kidney and ureter, unspecified: Secondary | ICD-10-CM

## 2023-12-17 DIAGNOSIS — M25511 Pain in right shoulder: Secondary | ICD-10-CM

## 2023-12-17 DIAGNOSIS — G8929 Other chronic pain: Secondary | ICD-10-CM

## 2023-12-17 DIAGNOSIS — N183 Chronic kidney disease, stage 3 unspecified: Secondary | ICD-10-CM | POA: Insufficient documentation

## 2023-12-17 MED ORDER — POLYETHYLENE GLYCOL 3350 17 G PO PACK: 17.0000 g | PACK | Freq: Every day | ORAL | 3 refills | Status: AC

## 2023-12-17 MED ORDER — ACETAMINOPHEN 500 MG PO TABS: 1000.0000 mg | ORAL_TABLET | Freq: Three times a day (TID) | ORAL | 2 refills | Status: AC | PRN

## 2023-12-17 NOTE — Assessment & Plan Note (Signed)
 Patient was last evaluated in January 2024 for R shoulder pain after he fell on his shoulder and he was prescribed a short course of ibuprofen . Since then, his pain has progressed. His shoulder hurts with movement and the gabapentin  600mg  TID prn has not relieved the pain. He denies neuropathy symptoms.   On exam, the right shoulder has normal ROM. There is pain with abduction of the shoulder. Cross body test is negative, hawkins test is positive.  Exam and symptoms are concerning for either a rotator cuff injury vs a subacromial impingement    Plan: -Xray of right shoulder due to traumatic injury (fall) -PT -AVOID NSAIDs, he has renal insuffiencey being evaluated -tylenol  prn -If PT does not help the pain, would recommend injections

## 2023-12-17 NOTE — Progress Notes (Signed)
 Established Patient Office Visit  Subjective   Patient ID: Bruce [name], male    DOB: 1976-01-17  Age: 48 y.o. MRN: [id number]  Chief Complaint  Patient presents with   Medication Refill   Shoulder Pain    Bruce [name] is a 48 y.o. who presents to the clinic for right shoulder pain and renal insufficiency. Please see problem based assessment and plan for additional details.   Patient Active Problem List   Diagnosis Date Noted   Renal insufficiency 12/17/2023   Radiculopathy 09/28/2023   Morbid obesity (HCC) 05/22/2023   Snoring 05/02/2023   OSA (obstructive sleep apnea) 03/18/2023   Sciatica 03/13/2023   PTSD (post-traumatic stress disorder) 03/12/2023   Stable angina (HCC) 12/09/2022   Chronic lower back pain 12/07/2022   Pulmonary nodule 09/28/2022   Shoulder pain, right 09/14/2022   Obesity, diabetes, and hypertension syndrome (HCC) 09/14/2022   Healthcare maintenance 08/31/2022   COPD (chronic obstructive pulmonary disease) (HCC) 08/30/2022   Right leg pain 10/20/2021   IBS (irritable bowel syndrome) 05/04/2021   Unexplained night sweats 10/13/2019   Chronic headaches 10/13/2019   Dizziness 10/13/2019   Earlobe lesion, left 07/15/2019   HLD (hyperlipidemia) 03/12/2019   Abscess of apex of dental root complicating chronic inflammation 03/12/2019   Finger pain, left 01/20/2019   HTN (hypertension) 11/12/2018   Tobacco use disorder 05/20/2018   Schizophrenia (HCC) 04/15/2018   Bipolar disorder (HCC) 04/15/2018   Anxiety and depression 04/15/2018   Gastroesophageal reflux disease 04/15/2018   At risk for obstructive sleep apnea 04/15/2018      Objective:     BP 118/66 (BP Location: Right Arm, Patient Position: Sitting, Cuff Size: Small)   Pulse 92   Temp 98 F (36.7 C) (Oral)   Ht 6\' 5"  (1.956 m)   Wt 273 lb 4.8 oz (124 kg)   SpO2 99%   BMI 32.41 kg/m  BP Readings from Last 3 Encounters:  12/17/23 118/66  12/12/23 111/74  12/10/23 (!) 145/77    Wt Readings from Last 3 Encounters:  12/17/23 273 lb 4.8 oz (124 kg)  12/12/23 272 lb 3.2 oz (123.5 kg)  12/10/23 277 lb (125.6 kg)      Physical Exam Vitals reviewed.  Constitutional:      General: He is not in acute distress.    Appearance: He is not ill-appearing, toxic-appearing or diaphoretic.  Cardiovascular:     Rate and Rhythm: Normal rate and regular rhythm.     Heart sounds: Normal heart sounds. No murmur heard. Pulmonary:     Effort: Pulmonary effort is normal. No respiratory distress.     Breath sounds: No wheezing or rales.  Musculoskeletal:     Right shoulder: No bony tenderness or crepitus. Normal range of motion.     Comments: Hawkins test: + Pain with Abduction of R shoulder Cross body test: -  Skin:    General: Skin is warm and dry.  Neurological:     Mental Status: He is alert.     Comments: B/L UE motor strength: 5/5  Psychiatric:        Mood and Affect: Mood and affect normal.        Speech: Speech normal.        Behavior: Behavior normal. Behavior is cooperative.     Last metabolic panel Lab Results  Component Value Date   GLUCOSE 165 (H) 10/03/2023   NA 135 10/03/2023   K 4.4 10/03/2023   CL 106 10/03/2023   CO2 20 (  L) 10/03/2023   BUN 22 (H) 10/03/2023   CREATININE 1.65 (H) 10/03/2023   GFRNONAA 52 (L) 10/03/2023   CALCIUM  9.5 10/03/2023   PROT 6.7 10/03/2023   ALBUMIN 3.4 (L) 10/03/2023   LABGLOB 3.4 04/15/2018   AGRATIO 1.4 04/15/2018   BILITOT 0.4 10/03/2023   ALKPHOS 73 10/03/2023   AST 20 10/03/2023   ALT 31 10/03/2023   ANIONGAP 9 10/03/2023   Last lipids Lab Results  Component Value Date   CHOL 133 09/11/2022   HDL 32 (L) 09/11/2022   LDLCALC 79 09/11/2022   TRIG 121 09/11/2022   CHOLHDL 4.2 09/11/2022   Last hemoglobin A1c Lab Results  Component Value Date   HGBA1C 6.1 (A) 10/30/2023      The 10-year ASCVD risk score (Arnett DK, et al., 2019) is: 11%    Assessment & Plan:   Problem List Items Addressed  This Visit       Genitourinary   Renal insufficiency   Patient has chronic increase in serum creatinine over the past three years with persevered GFR until his last BMP in February 2025 where his GFR decreased to 52. Although he has diabetes and HTN, will evaluate for additional causes of renal insuffiencey at this time.  Plan: -Urine ACR is WNL and UTD -Collect U/A -Renal US  -Repeat BMP today  -Avoid NSAIDs       Relevant Orders   Basic metabolic panel with GFR   US  Renal   Urinalysis, Reflex Microscopic     Other   Shoulder pain, right - Primary   Patient was last evaluated in January 2024 for R shoulder pain after he fell on his shoulder and he was prescribed a short course of ibuprofen . Since then, his pain has progressed. His shoulder hurts with movement and the gabapentin  600mg  TID prn has not relieved the pain. He denies neuropathy symptoms.   On exam, the right shoulder has normal ROM. There is pain with abduction of the shoulder. Cross body test is negative, hawkins test is positive.  Exam and symptoms are concerning for either a rotator cuff injury vs a subacromial impingement    Plan: -Xray of right shoulder due to traumatic injury (fall) -PT -AVOID NSAIDs, he has renal insuffiencey being evaluated -tylenol  prn -If PT does not help the pain, would recommend injections       Relevant Orders   Ambulatory referral to Physical Therapy   DG Shoulder Right    Return in about 3 months (around 03/17/2024) for shoulder pain .    Bruce Lees, DO

## 2023-12-17 NOTE — Assessment & Plan Note (Signed)
 Patient has chronic increase in serum creatinine over the past three years with persevered GFR until his last BMP in February 2025 where his GFR decreased to 52. Although he has diabetes and HTN, will evaluate for additional causes of renal insuffiencey at this time.  Plan: -Urine ACR is WNL and UTD -Collect U/A -Renal US  -Repeat BMP today  -Avoid NSAIDs

## 2023-12-17 NOTE — Patient Instructions (Signed)
 Thank you, [name] for allowing us  to provide your care today. Today we discussed right shoulder pain: I have ordered physical therapy and tylenol . If the pain continues after trying PT, we can try a joint injection.   I have ordered a few labs to evaluate the cause of your renal insufficiency.    I have ordered the following labs for you:   Lab Orders         Basic metabolic panel with GFR         Urinalysis, Reflex Microscopic       Referrals ordered today:    Referral Orders         Ambulatory referral to Physical Therapy      I have ordered the following medication/changed the following medications:   Stop the following medications: There are no discontinued medications.   Start the following medications: Meds ordered this encounter  Medications   polyethylene glycol (MIRALAX ) 17 g packet    Sig: Take 17 g by mouth daily.    Dispense:  30 packet    Refill:  3   acetaminophen  (TYLENOL ) 500 MG tablet    Sig: Take 2 tablets (1,000 mg total) by mouth every 8 (eight) hours as needed for moderate pain (pain score 4-6). DO NOT exceed more than 4,000 mg daily    Dispense:  90 tablet    Refill:  2     Follow up: 3 months     Should you have any questions or concerns please call the internal medicine clinic at [phone].     Please note that our late policy has changed.  If you are more than 15 minutes late to your appointment, you may be asked to reschedule your appointment.  Dr. Sharlon Deacon, D.O. [facility]

## 2023-12-18 ENCOUNTER — Telehealth: Payer: Self-pay | Admitting: *Deleted

## 2023-12-18 ENCOUNTER — Other Ambulatory Visit: Payer: Self-pay | Admitting: Student

## 2023-12-18 DIAGNOSIS — E875 Hyperkalemia: Secondary | ICD-10-CM

## 2023-12-18 DIAGNOSIS — N289 Disorder of kidney and ureter, unspecified: Secondary | ICD-10-CM

## 2023-12-18 LAB — URINALYSIS, ROUTINE W REFLEX MICROSCOPIC
Bilirubin, UA: NEGATIVE
Glucose, UA: NEGATIVE
Nitrite, UA: NEGATIVE
RBC, UA: NEGATIVE
Specific Gravity, UA: 1.024 (ref 1.005–1.030)
Urobilinogen, Ur: 1 mg/dL (ref 0.2–1.0)
pH, UA: 6.5 (ref 5.0–7.5)

## 2023-12-18 LAB — BASIC METABOLIC PANEL WITH GFR
BUN/Creatinine Ratio: 5 — ABNORMAL LOW (ref 9–20)
BUN: 9 mg/dL (ref 6–24)
CO2: 23 mmol/L (ref 20–29)
Calcium: 9.5 mg/dL (ref 8.7–10.2)
Chloride: 100 mmol/L (ref 96–106)
Creatinine, Ser: 1.69 mg/dL — ABNORMAL HIGH (ref 0.76–1.27)
Glucose: 75 mg/dL (ref 70–99)
Potassium: 5.4 mmol/L — ABNORMAL HIGH (ref 3.5–5.2)
Sodium: 139 mmol/L (ref 134–144)
eGFR: 50 mL/min/{1.73_m2} — ABNORMAL LOW (ref >59)

## 2023-12-18 LAB — MICROSCOPIC EXAMINATION
Casts: NONE SEEN /LPF
RBC, Urine: NONE SEEN /HPF (ref 0–2)
WBC, UA: >30 /HPF — AB (ref 0–5)

## 2023-12-18 MED ORDER — LOKELMA 10 G PO PACK: 10.0000 g | PACK | Freq: Once | ORAL | 0 refills | Status: AC

## 2023-12-18 MED ORDER — LOKELMA 10 G PO PACK: 10.0000 g | PACK | Freq: Every day | ORAL | 0 refills | Status: DC

## 2023-12-18 NOTE — Telephone Encounter (Signed)
 I talked to Charlcie Conger at [facility] who stated they received Lokelma rx. Stated it comes in a qty of a 30 day supply; not just "I packet". Re-send a new rx for qty of 30. Thanks

## 2023-12-18 NOTE — Telephone Encounter (Signed)
 Copied from CRM [id number]. Topic: Clinical - Medication Question >> Dec 18, 2023 12:15 PM Retta Caster wrote: Reason for CRM: sodium zirconium cyclosilicate (LOKELMA) 10 g PACK packet-Victor from the pharmacy needs call back on medication. Has some questions.   Location: [facility] - [place], [place] - [address] [place] [address] Phone: [phone] Fax: [phone] Hours: Not open 24 hours

## 2023-12-18 NOTE — Progress Notes (Signed)
 Spoke with pharmacy who stated that the lowest packs of Lokelma they will dispense at a time is 30. They stated that they can not dispense just one packet.  Lokelma prescription sent for 30 packs with the instructions to only take ONE packet.

## 2023-12-20 ENCOUNTER — Encounter: Payer: Self-pay | Admitting: Gastroenterology

## 2023-12-20 ENCOUNTER — Other Ambulatory Visit: Payer: Self-pay | Admitting: *Deleted

## 2023-12-20 MED ORDER — FLUCONAZOLE 200 MG PO TABS: ORAL_TABLET | ORAL | 0 refills | Status: DC

## 2023-12-20 NOTE — Progress Notes (Signed)
 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  This is a fungal infection of the esophagus which can cause pain or difficulty with swallowing.  Patients with diabetes are at increased risk of these fungal infections. I recommend we treat this fungus with fluconazole. Please take 2 tablets of Diflucan on day 1, followed by 1 tablet daily for the next 13 days. The biopsies of the upper portion of your esophagus showed evidence of acid reflux, but did not show any evidence of eosinophilic esophagitis.  The biopsies taken from your stomach were notable for mild reactive gastropathy which is a common finding and often related to use of certain medications (usually NSAIDs), but there was no evidence of Helicobacter pylori infection. This common finding is not felt to necessarily be a cause of any particular symptom and there is no specific treatment or further evaluation recommended.  The biopsies of the polyp in your stomach thankfully did not show any precancerous changes.  Hyperplastic polyps in the stomach are, in general, not considered to be precancerous.  Rarely, however, very large hyperplastic polyps have been thought to pose risk of cancerous transformation.  As you will be needing another colonoscopy in 1 year, I would recommend that we also repeat an upper endoscopy in 1 year to reexamine, and possibly remove this polyp in your stomach.  All of the 46 polyps removed from your colon were precancerous polyps.  As discussed, this is highly suggestive of a genetic mutation.  Some known mutations carry an extremely high risk of colon cancer, to the point that surgically removing the colon is recommended.  We want to make sure that you do not have this type of mutation.  Please follow-up with the genetic counselor and genetic testing as previously recommended.  You will need a repeat colonoscopy in 1 year.  Team, Please place prescription for Diflucan 200  mg, take 2 tabs on day 1, followed by 1 tab daily for 13 days #15 RF 0

## 2023-12-20 NOTE — Progress Notes (Signed)
 Please place prescription for Diflucan 200 mg, take 2 tabs on day 1, followed by 1 tab daily for 13 days #15 RF 0

## 2023-12-21 NOTE — Progress Notes (Signed)
 Internal Medicine Clinic Attending  Case discussed with the resident at the time of the visit.  We reviewed the resident's history and exam and pertinent patient test results.  I agree with the assessment, diagnosis, and plan of care documented in the resident's note.

## 2023-12-25 NOTE — Progress Notes (Signed)
 CC: 1 week follow-up for potassium recheck.  HPI:  [name] is a 48 y.o. male living with a history stated below and presents today for potassium recheck. Please see problem based assessment and plan for additional details.  Patient was last seen in [facility] on 4/29.  - Last seen GI, EGD positive for Candida esophagitis, 46 polyps in the stomach this was removed. -Renal ultrasound yet to be completed..  Past Medical History:  Diagnosis Date   Anxiety disorder    At risk for obstructive sleep apnea 04/15/2018   Bipolar 1 disorder (HCC)    COPD (chronic obstructive pulmonary disease) (HCC)    Depression    Hepatitis B immune 04/15/2018   HLD (hyperlipidemia)    Hypertension    Schizophrenia (HCC)     Current Outpatient Medications on File Prior to Visit  Medication Sig Dispense Refill   acetaminophen  (TYLENOL ) 500 MG tablet Take 2 tablets (1,000 mg total) by mouth every 8 (eight) hours as needed for moderate pain (pain score 4-6). DO NOT exceed more than 4,000 mg daily 90 tablet 2   albuterol  (VENTOLIN  HFA) 108 (90 Base) MCG/ACT inhaler Inhale 2 puffs into the lungs every 6 (six) hours as needed for wheezing or shortness of breath. 8 g 4   atorvastatin  (LIPITOR) 40 MG tablet TAKE 1 TABLET (40 MG TOTAL) BY MOUTH DAILY. (BEDTIME) 90 tablet 1   blood glucose meter kit and supplies Dispense based on patient and insurance preference. Use up to four times daily as directed. (FOR ICD-10 E10.9, E11.9). 1 each 0   dicyclomine  (BENTYL ) 20 MG tablet TAKE 1 TABLET BY MOUTH EVERY 6 (SIX) HOURS. (AM+NOON+PM+BEDTIME) 120 tablet 2   famotidine  (PEPCID ) 20 MG tablet TAKE 1 TABLET (20 MG TOTAL) BY MOUTH 2 (TWO) TIMES DAILY.(AM+PM) 60 tablet 2   fluconazole  (DIFLUCAN ) 200 MG tablet take 2 tablets by mouth on day 1, followed by 1 tablet by mouth once daily for 13 days 15 tablet 0   gabapentin  (NEURONTIN ) 300 MG capsule Take 1 capsule (300 mg total) by mouth 3 (three) times daily. 90 capsule 10    gabapentin  (NEURONTIN ) 600 MG tablet Take 1 tablet (600 mg total) by mouth 3 (three) times daily as needed. 30 tablet 5   hydrOXYzine (ATARAX/VISTARIL) 25 MG tablet Take 25 mg by mouth 2 (two) times daily as needed.     lamoTRIgine (LAMICTAL) 100 MG tablet Take 100 mg by mouth daily.     lisinopril  (ZESTRIL ) 20 MG tablet TAKE 1 TABLET (20 MG TOTAL) BY MOUTH DAILY. (AM) 90 tablet 0   metFORMIN  (GLUCOPHAGE ) 1000 MG tablet Take 1 tablet (1,000 mg total) by mouth 2 (two) times daily with a meal. 60 tablet 10   metoprolol  succinate (TOPROL  XL) 25 MG 24 hr tablet Take 1 tablet (25 mg total) by mouth daily. 30 tablet 0   mirtazapine  (REMERON ) 15 MG tablet Take 1 tablet (15 mg total) by mouth at bedtime. 30 tablet 0   nitroGLYCERIN  (NITROSTAT ) 0.4 MG SL tablet Place 1 tablet (0.4 mg total) under the tongue every 5 (five) minutes as needed for chest pain. 100 tablet 3   OZEMPIC , 2 MG/DOSE, 8 MG/3ML SOPN Inject 2 mg into the skin once a week.     pantoprazole  (PROTONIX ) 40 MG tablet TAKE 1 TABLET BY MOUTH 2 (TWO) TIMES DAILY (AM+PM) 90 tablet 3   polyethylene glycol (MIRALAX ) 17 g packet Take 17 g by mouth daily. 30 packet 3   prazosin  (MINIPRESS )  1 MG capsule Take 1 capsule (1 mg total) by mouth at bedtime. 30 capsule 0   risperiDONE (RISPERDAL) 0.5 MG tablet Take 0.5 mg by mouth at bedtime.     senna (SENOKOT) 8.6 MG TABS tablet Take 1 tablet (8.6 mg total) by mouth daily as needed for mild constipation. 30 tablet 3   sertraline  (ZOLOFT ) 100 MG tablet Take 150 mg by mouth daily.     Current Facility-Administered Medications on File Prior to Visit  Medication Dose Route Frequency Provider Last Rate Last Admin   0.9 %  sodium chloride  infusion  500 mL Intravenous Once Elois Hair, MD         Review of Systems: ROS negative except for what is noted on the assessment and plan.  Vitals:   12/26/23 1012  BP: 121/66  Pulse: 97  Temp: 98.2 F (36.8 C)  TempSrc: Oral  SpO2: 97%  Weight:  273 lb 1.6 oz (123.9 kg)  Height: 6\' 5"  (1.956 m)    Physical Exam: Constitutional: Well-appearing, not in acute distress. HENT: No oral thrush. Cardiovascular: regular rate and rhythm, no m/r/g Assessment & Plan:   Hyperkalemia Patient is here for 1 week visit to recheck his potassium.  At last office visit, K  5.4.  I think this was likely secondary to hemolyzed blood.  Not endorsing any muscle weakness or myalgia. He is on ACE inhibitor, that could also cause hyperkalemia, however this is unlikely as he has been on this medicine for a long period of time without any issues. -Will check his BMP, K still elevated, will hold lisinopril  for about a week, get repeat BMP.  Candidal esophagitis (HCC) EGD showed white plaques in the entire esophagus that was positive for Candida.  Sessile polyp in the gastric fundus that was removed, non cancerous, H. pylori negative. 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  He is not immunocompromise, tested negative for HIV and hepatitis B in 2020.   Denies odynophagia. On exams, mouth without oral thrush.  Was prescribed Diflucan , states compliance. -Continue Diflucan  - Will need a repeat EGD in a year.  Personal history of hyperplastic colon polyps Colonoscopy 12/08/2023 showed mild reactive gastropathy and 46 hyperplastic polyps, that were removed.  Path result negative for cancer, however this is highly concerning for genetic mutations. Patient's mother, uncle and sister all have history of hyperplastic polyps that were removed.  He has upcoming appointment with a genetic counselor in about a week. -Follow-up with genetic counselor -Repeat colonoscopy in a year.  Shoulder pain, right Improving, has been using Tylenol  for pain.  Will start working with physical therapy in a few days.  Patient had requested a work note given his shoulder pain, and general malaise.   I encouraged  patient to first work with physical therapy, and if there is a need for work restrictions, physical therapy will PCP let know.   -Tylenol  with pain - Please follow up on PT notes.  Renal insufficiency Etiology unclear, UA negative for casts.  Renal ultrasound has been ordered yet to be completed. -Will check BMP.  Patient discussed with Dr. Burnie Cartwright, MD [facility] Internal Medicine, PGY-1 Pager: [phone] Date 12/27/2023 Time 8:08 AM

## 2023-12-26 ENCOUNTER — Ambulatory Visit (INDEPENDENT_AMBULATORY_CARE_PROVIDER_SITE_OTHER): Payer: MEDICAID | Admitting: Student

## 2023-12-26 VITALS — BP 121/66 | HR 97 | Temp 98.2°F | Ht 77.0 in | Wt 273.1 lb

## 2023-12-26 DIAGNOSIS — B3781 Candidal esophagitis: Secondary | ICD-10-CM

## 2023-12-26 DIAGNOSIS — N289 Disorder of kidney and ureter, unspecified: Secondary | ICD-10-CM

## 2023-12-26 DIAGNOSIS — M25511 Pain in right shoulder: Secondary | ICD-10-CM

## 2023-12-26 DIAGNOSIS — Z860102 Personal history of hyperplastic colon polyps: Secondary | ICD-10-CM | POA: Diagnosis not present

## 2023-12-26 DIAGNOSIS — E875 Hyperkalemia: Secondary | ICD-10-CM

## 2023-12-26 NOTE — Patient Instructions (Signed)
 Thank you, Bruce [name] for allowing us  to provide your care today. Today we discussed : For your hyperplastic polyps, please follow-up with a genetic counselor. Please continue to rinse your mouth after using your inhalers. I will check your potassium today.  I will call you with the results Please follow-up with physical therapy on 5/14, I will follow-up with your notes,   I have ordered the following labs for you:  Lab Orders  No laboratory test(s) ordered today     Tests ordered today:    Referrals ordered today:   Referral Orders  No referral(s) requested today     I have ordered the following medication/changed the following medications:   Stop the following medications: There are no discontinued medications.   Start the following medications: No orders of the defined types were placed in this encounter.    Follow up: 3 months   Should you have any questions or concerns please call the internal medicine clinic at [phone].    Bruce Sins, MD  [facility]

## 2023-12-27 DIAGNOSIS — E875 Hyperkalemia: Secondary | ICD-10-CM | POA: Insufficient documentation

## 2023-12-27 DIAGNOSIS — B3781 Candidal esophagitis: Secondary | ICD-10-CM | POA: Insufficient documentation

## 2023-12-27 LAB — BMP8+ANION GAP
Anion Gap: 19 mmol/L — ABNORMAL HIGH (ref 10.0–18.0)
BUN/Creatinine Ratio: 7 — ABNORMAL LOW (ref 9–20)
BUN: 12 mg/dL (ref 6–24)
CO2: 20 mmol/L (ref 20–29)
Calcium: 9.9 mg/dL (ref 8.7–10.2)
Chloride: 102 mmol/L (ref 96–106)
Creatinine, Ser: 1.66 mg/dL — ABNORMAL HIGH (ref 0.76–1.27)
Glucose: 109 mg/dL — ABNORMAL HIGH (ref 70–99)
Potassium: 4.9 mmol/L (ref 3.5–5.2)
Sodium: 141 mmol/L (ref 134–144)
eGFR: 51 mL/min/{1.73_m2} — ABNORMAL LOW (ref >59)

## 2023-12-27 NOTE — Assessment & Plan Note (Signed)
 Patient is here for 1 week visit to recheck his potassium.  At last office visit, K  5.4.  I think this was likely secondary to hemolyzed blood.  Not endorsing any muscle weakness or myalgia. He is on ACE inhibitor, that could also cause hyperkalemia, however this is unlikely as he has been on this medicine for a long period of time without any issues. -Will check his BMP, K still elevated, will hold lisinopril  for about a week, get repeat BMP.

## 2023-12-27 NOTE — Assessment & Plan Note (Signed)
 Colonoscopy 12/08/2023 showed mild reactive gastropathy and 46 hyperplastic polyps, that were removed.  Path result negative for cancer, however this is highly concerning for genetic mutations. Patient's mother, uncle and sister all have history of hyperplastic polyps that were removed.  He has upcoming appointment with a genetic counselor in about a week. -Follow-up with genetic counselor -Repeat colonoscopy in a year.

## 2023-12-27 NOTE — Assessment & Plan Note (Signed)
 Etiology unclear, UA negative for casts.  Renal ultrasound has been ordered yet to be completed. -Will check BMP.

## 2023-12-27 NOTE — Assessment & Plan Note (Signed)
 EGD showed white plaques in the entire esophagus that was positive for Candida.  Sessile polyp in the gastric fundus that was removed, non cancerous, H. pylori negative. 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  He is not immunocompromise, tested negative for HIV and hepatitis B in 2020.   Denies odynophagia. On exams, mouth without oral thrush.  Was prescribed Diflucan , states compliance. -Continue Diflucan  - Will need a repeat EGD in a year.

## 2023-12-27 NOTE — Assessment & Plan Note (Addendum)
 Improving, has been using Tylenol  for pain.  Will start working with physical therapy in a few days.  Patient had requested a work note given his shoulder pain, and general malaise.  I encouraged  patient to first work with physical therapy, and if there is a need for work restrictions, physical therapy will PCP let know.   -Tylenol  with pain - Please follow up on PT notes.

## 2023-12-31 ENCOUNTER — Ambulatory Visit: Payer: Self-pay | Admitting: Student

## 2023-12-31 ENCOUNTER — Other Ambulatory Visit: Payer: Self-pay | Admitting: Student

## 2023-12-31 DIAGNOSIS — E8729 Other acidosis: Secondary | ICD-10-CM

## 2023-12-31 NOTE — Progress Notes (Signed)
 BMP with elevated anion gap metabolic acidosis.  Normal glucose.  Will get a repeat BMP, will hold metformin  as that can lead to worsening metabolic acidosis.  I have placed an order for a lab only visit next week.

## 2024-01-01 ENCOUNTER — Ambulatory Visit: Payer: MEDICAID | Attending: Internal Medicine

## 2024-01-01 ENCOUNTER — Other Ambulatory Visit: Payer: Self-pay

## 2024-01-01 DIAGNOSIS — M6281 Muscle weakness (generalized): Secondary | ICD-10-CM | POA: Insufficient documentation

## 2024-01-01 DIAGNOSIS — M25511 Pain in right shoulder: Secondary | ICD-10-CM | POA: Diagnosis present

## 2024-01-01 DIAGNOSIS — R293 Abnormal posture: Secondary | ICD-10-CM | POA: Diagnosis present

## 2024-01-01 DIAGNOSIS — G8929 Other chronic pain: Secondary | ICD-10-CM | POA: Insufficient documentation

## 2024-01-01 NOTE — Therapy (Signed)
 OUTPATIENT PHYSICAL THERAPY SHOULDER EVALUATION   Patient Name: Bruce [name] MRN: [id number] DOB:18-Jul-1976, 48 y.o., male Today's Date: 01/01/2024  END OF SESSION:  PT End of Session - 01/01/24 1057     Visit Number 1    Number of Visits 17    Date for PT Re-Evaluation 02/26/24    Authorization Type [employer]    PT Start Time 1015    PT Stop Time 1046    PT Time Calculation (min) 31 min    Activity Tolerance Patient tolerated treatment well             Past Medical History:  Diagnosis Date   Anxiety disorder    At risk for obstructive sleep apnea 04/15/2018   Bipolar 1 disorder (HCC)    COPD (chronic obstructive pulmonary disease) (HCC)    Depression    Hepatitis B immune 04/15/2018   HLD (hyperlipidemia)    Hypertension    Schizophrenia (HCC)    Past Surgical History:  Procedure Laterality Date   wisdom tooth removal     Patient Active Problem List   Diagnosis Date Noted   Hyperkalemia 12/27/2023   Candidal esophagitis (HCC) 12/27/2023   Personal history of hyperplastic colon polyps 12/26/2023   Renal insufficiency 12/17/2023   Radiculopathy 09/28/2023   Morbid obesity (HCC) 05/22/2023   Snoring 05/02/2023   OSA (obstructive sleep apnea) 03/18/2023   Sciatica 03/13/2023   PTSD (post-traumatic stress disorder) 03/12/2023   Stable angina (HCC) 12/09/2022   Chronic lower back pain 12/07/2022   Pulmonary nodule 09/28/2022   Shoulder pain, right 09/14/2022   Obesity, diabetes, and hypertension syndrome (HCC) 09/14/2022   Healthcare maintenance 08/31/2022   COPD (chronic obstructive pulmonary disease) (HCC) 08/30/2022   Right leg pain 10/20/2021   IBS (irritable bowel syndrome) 05/04/2021   Unexplained night sweats 10/13/2019   Chronic headaches 10/13/2019   Dizziness 10/13/2019   Earlobe lesion, left 07/15/2019   HLD (hyperlipidemia) 03/12/2019   Abscess of apex of dental root complicating chronic inflammation 03/12/2019   Finger pain, left  01/20/2019   HTN (hypertension) 11/12/2018   Tobacco use disorder 05/20/2018   Schizophrenia (HCC) 04/15/2018   Bipolar disorder (HCC) 04/15/2018   Anxiety and depression 04/15/2018   Gastroesophageal reflux disease 04/15/2018   At risk for obstructive sleep apnea 04/15/2018    PCP: Priscella Brooms, DO   REFERRING PROVIDER: Priscella Brooms, DO   REFERRING DIAG: [phone] (ICD-10-CM) - Chronic right shoulder pain   THERAPY DIAG:  Chronic right shoulder pain  Muscle weakness (generalized)  Abnormal posture  Rationale for Evaluation and Treatment: Rehabilitation  ONSET DATE: January 2024  SUBJECTIVE:  SUBJECTIVE STATEMENT: Pt presents to PT with reports of chronic R shoulder pain after fall onto shoulder in January of 2024. Notes pain in lateral/posterior shoulder and is much worse with walking and lifting his children. Has some occasional R hand numbness though this is infrequent. Pain has been gradually getting worse.   Hand dominance: Right  PERTINENT HISTORY: HTN, COPD, Bipolar disorder, schizophrenia   PAIN:  Are you having pain?  Yes: NPRS scale: 5/10 Worst: 9/10 Pain location: R posterior/lateral shoulder Pain description: sharp, tigh Aggravating factors: OH reaching, walking Relieving factors: medication, rest  PRECAUTIONS: None  RED FLAGS: None   WEIGHT BEARING RESTRICTIONS: No  FALLS:  Has patient fallen in last 6 months? No  LIVING ENVIRONMENT: Lives with: lives with their family Lives in: House/apartment  OCCUPATION: Not working  PLOF: Independent  PATIENT GOALS: decrease R shoulder pain, be able to reach and sleep better  NEXT MD VISIT:   OBJECTIVE:  Note: Objective measures were completed at Evaluation unless otherwise noted.  DIAGNOSTIC FINDINGS:  See  imaging   PATIENT SURVEYS:  Quick DASH: 55% disability   COGNITION: Overall cognitive status: Within functional limits for tasks assessed     SENSATION: Light touch: Impaired - R hand N/T  POSTURE: Rounded shoulder/fwd head  UPPER EXTREMITY ROM:   Active ROM Right eval Left eval  Shoulder flexion 70 WFL  Shoulder extension    Shoulder abduction 55 WFL  Shoulder adduction    Shoulder internal rotation R greater trochanter L2  Shoulder external rotation 35 70  Elbow flexion    Elbow extension    Wrist flexion    Wrist extension    Wrist ulnar deviation    Wrist radial deviation    Wrist pronation    Wrist supination    (Blank rows = not tested)  UPPER EXTREMITY MMT:  MMT Right eval Left eval  Shoulder flexion 3 4  Shoulder extension    Shoulder abduction 2+ 4  Shoulder adduction    Shoulder internal rotation 3 4  Shoulder external rotation 2+ 4  Middle trapezius    Lower trapezius    Elbow flexion    Elbow extension    Wrist flexion    Wrist extension    Wrist ulnar deviation    Wrist radial deviation    Wrist pronation    Wrist supination    Grip strength (lbs)    (Blank rows = not tested)  SHOULDER SPECIAL TESTS: DNT due to pain  JOINT MOBILITY TESTING:  GH hypomobility  PALPATION:  TTP to R infraspinatus   TREATMENT: [facility] Adult PT Treatment:                                                DATE: 01/01/2024 Therapeutic Exercise: R shoulder IR/ER iso x 5 - 5" hold Supine R shoulder flex to 90 deg x 5 Row x 5 GTG  PATIENT EDUCATION: Education details: eval findings, Quick DASH, HEP, POC Person educated: Patient Education method: Explanation, Demonstration, and Handouts Education comprehension: verbalized understanding and returned demonstration  HOME EXERCISE PROGRAM: Access Code: [email or web address] URL: [email or web address] Date: 01/01/2024 Prepared by: Loral Roch  Exercises - Standing Isometric Shoulder External Rotation with  Doorway and Towel Roll  - 1 x daily - 7 x weekly - 2-3 sets - 10 reps - 5 sec hold - Standing Isometric Shoulder Internal  Rotation with Towel Roll at Doorway  - 1 x daily - 7 x weekly - 2-3 sets - 10 reps - 5 sec hold - Supine Shoulder Flexion Extension Full Range AROM  - 1 x daily - 7 x weekly - 3 sets - 10 reps - Standing Shoulder Row with Anchored Resistance  - 1 x daily - 7 x weekly - 3 sets - 10 reps - green band hold  ASSESSMENT:  CLINICAL IMPRESSION: Patient is a 48 y.o. M who was seen today for physical therapy evaluation and treatment for chronic increasing R shoulder pain and discomfort. Physical findings are consistent with physician impression as pt demonstrates decrease in R shoulder strength, ROM, and comfort. Quick DASH score demonstrates severe disability in performance of home ADLs and community activities. Pt would benefit from skilled PT services working on improving RTC strength and stability in order to decrease R shoulder pain.   OBJECTIVE IMPAIRMENTS: decreased activity tolerance, decreased endurance, decreased mobility, decreased ROM, decreased strength, postural dysfunction, and pain  ACTIVITY LIMITATIONS: carrying, lifting, standing, bathing, toileting, dressing, reach over head, and locomotion level  PARTICIPATION LIMITATIONS: meal prep, cleaning, driving, shopping, community activity, and yard work  PERSONAL FACTORS: Time since onset of injury/illness/exacerbation and 3+ comorbidities: HTN, COPD, Bipolar disorder, schizophrenia  are also affecting patient's functional outcome.   REHAB POTENTIAL: Good  CLINICAL DECISION MAKING: Evolving/moderate complexity  EVALUATION COMPLEXITY: Moderate   GOALS: Goals reviewed with patient? No  SHORT TERM GOALS: Target date: 01/22/2024   Pt will be compliant and knowledgeable with initial HEP for improved comfort and carryover Baseline: initial HEP given Goal status: INITIAL  2.  Pt will self report right shoulder pain no  greater than 7/10 for improved comfort and functional ability Baseline: 9/10 at worst Goal status: INITIAL   LONG TERM GOALS: Target date: 02/26/2024  Pt will decrease Quick DASH disability score to no greater than 40% as proxy for functional improvement Baseline: 55% disability  Goal status: INITIAL  2.  Pt will self report R shoulder pain no greater than 3/10 for improved comfort and functional ability Baseline: 9/10 at worst Goal status: INITIAL   3.  Pt will improve ability to lift 30# KB with no increase in R shoulder pain to simulate improved function with lifting his young children Baseline: unable Goal status: INITIAL  4.  Pt will improve R shoulder flexion to no less than 140 degrees for improved functional ability with OH reaching and home ADLs Baseline:  Goal status: INITIAL  5.  Pt will improve R shoulder MMT to no less than 4/5 for all tested motions for improved comfort and dynamic stability Baseline: see MMT chart Goal status: INITIAL  PLAN:  PT FREQUENCY: 1-2x/week  PT DURATION: 8 weeks  PLANNED INTERVENTIONS: 97164- PT Re-evaluation, 97110-Therapeutic exercises, 97530- Therapeutic activity, [id number]- Neuromuscular re-education, 97535- Self Care, 09811- Manual therapy, G0283- Electrical stimulation (unattended), [id number]- Electrical stimulation (manual), 97016- Vasopneumatic device, Dry Needling, Cryotherapy, and Moist heat  PLAN FOR NEXT SESSION: assess HEP response, RTC and periscapular strengthening  For all possible CPT codes, reference the Planned Interventions line above.     Check all conditions that are expected to impact treatment: {Conditions expected to impact treatment:Musculoskeletal disorders   If treatment provided at initial evaluation, no treatment charged due to lack of authorization.       Ivor Mars, PT 01/01/2024, 10:58 AM

## 2024-01-06 ENCOUNTER — Ambulatory Visit: Payer: MEDICAID | Admitting: Physical Therapy

## 2024-01-06 ENCOUNTER — Encounter: Payer: Self-pay | Admitting: Physical Therapy

## 2024-01-06 DIAGNOSIS — M25511 Pain in right shoulder: Secondary | ICD-10-CM | POA: Diagnosis not present

## 2024-01-06 DIAGNOSIS — M6281 Muscle weakness (generalized): Secondary | ICD-10-CM

## 2024-01-06 DIAGNOSIS — G8929 Other chronic pain: Secondary | ICD-10-CM

## 2024-01-06 NOTE — Progress Notes (Signed)
 Internal Medicine Clinic Attending  Case discussed with the resident at the time of the visit.  We reviewed the resident's history and exam and pertinent patient test results.  I agree with the assessment, diagnosis, and plan of care documented in the resident's note.

## 2024-01-06 NOTE — Therapy (Signed)
 OUTPATIENT PHYSICAL THERAPY SHOULDER TREATMENT    Patient Name: Bruce [name] MRN: [id number] DOB:1976-04-13, 48 y.o., male Today's Date: 01/06/2024  END OF SESSION:  PT End of Session - 01/06/24 0916     Visit Number 2    Number of Visits 17    Date for PT Re-Evaluation 02/26/24    Authorization Type [employer]    Authorization Time Period 01/01/24-02/15/24    Authorization - Visit Number 2    Authorization - Number of Visits 12    PT Start Time 0930    PT Stop Time 1010    PT Time Calculation (min) 40 min             Past Medical History:  Diagnosis Date   Anxiety disorder    At risk for obstructive sleep apnea 04/15/2018   Bipolar 1 disorder (HCC)    COPD (chronic obstructive pulmonary disease) (HCC)    Depression    Hepatitis B immune 04/15/2018   HLD (hyperlipidemia)    Hypertension    Schizophrenia (HCC)    Past Surgical History:  Procedure Laterality Date   wisdom tooth removal     Patient Active Problem List   Diagnosis Date Noted   Hyperkalemia 12/27/2023   Candidal esophagitis (HCC) 12/27/2023   Personal history of hyperplastic colon polyps 12/26/2023   Renal insufficiency 12/17/2023   Radiculopathy 09/28/2023   Morbid obesity (HCC) 05/22/2023   Snoring 05/02/2023   OSA (obstructive sleep apnea) 03/18/2023   Sciatica 03/13/2023   PTSD (post-traumatic stress disorder) 03/12/2023   Stable angina (HCC) 12/09/2022   Chronic lower back pain 12/07/2022   Pulmonary nodule 09/28/2022   Shoulder pain, right 09/14/2022   Obesity, diabetes, and hypertension syndrome (HCC) 09/14/2022   Healthcare maintenance 08/31/2022   COPD (chronic obstructive pulmonary disease) (HCC) 08/30/2022   Right leg pain 10/20/2021   IBS (irritable bowel syndrome) 05/04/2021   Unexplained night sweats 10/13/2019   Chronic headaches 10/13/2019   Dizziness 10/13/2019   Earlobe lesion, left 07/15/2019   HLD (hyperlipidemia) 03/12/2019   Abscess of apex of dental root  complicating chronic inflammation 03/12/2019   Finger pain, left 01/20/2019   HTN (hypertension) 11/12/2018   Tobacco use disorder 05/20/2018   Schizophrenia (HCC) 04/15/2018   Bipolar disorder (HCC) 04/15/2018   Anxiety and depression 04/15/2018   Gastroesophageal reflux disease 04/15/2018   At risk for obstructive sleep apnea 04/15/2018    PCP: Priscella Brooms, DO   REFERRING PROVIDER: Priscella Brooms, DO   REFERRING DIAG: [phone] (ICD-10-CM) - Chronic right shoulder pain   THERAPY DIAG:  Chronic right shoulder pain  Muscle weakness (generalized)  Rationale for Evaluation and Treatment: Rehabilitation  ONSET DATE: January 2024  SUBJECTIVE:  SUBJECTIVE STATEMENT Pt reports increased pain after HEP and has not completed them since. No pain at rest. Had difficulty with shoulder last night, unable to get comfortable.    EVAL: Pt presents to PT with reports of chronic R shoulder pain after fall onto shoulder in January of 2024. Notes pain in lateral/posterior shoulder and is much worse with walking and lifting his children. Has some occasional R hand numbness though this is infrequent. Pain has been gradually getting worse.   Hand dominance: Right  PERTINENT HISTORY: HTN, COPD, Bipolar disorder, schizophrenia   PAIN:  Are you having pain?  Yes: NPRS scale: 0/10 Worst: 9/10 Pain location: R posterior/lateral shoulder Pain description: sharp, tigh Aggravating factors: OH reaching, walking Relieving factors: medication, rest  PRECAUTIONS: None  RED FLAGS: None   WEIGHT BEARING RESTRICTIONS: No  FALLS:  Has patient fallen in last 6 months? No  LIVING ENVIRONMENT: Lives with: lives with their family Lives in: House/apartment  OCCUPATION: Not working  PLOF:  Independent  PATIENT GOALS: decrease R shoulder pain, be able to reach and sleep better  NEXT MD VISIT:   OBJECTIVE:  Note: Objective measures were completed at Evaluation unless otherwise noted.  DIAGNOSTIC FINDINGS:  See imaging   PATIENT SURVEYS:  Quick DASH: 55% disability   COGNITION: Overall cognitive status: Within functional limits for tasks assessed     SENSATION: Light touch: Impaired - R hand N/T  POSTURE: Rounded shoulder/fwd head  UPPER EXTREMITY ROM:   Active ROM Right eval Left eval Right  01/06/24  Shoulder flexion 70 WFL 80  Shoulder extension     Shoulder abduction 55 WFL 90  Shoulder adduction     Shoulder internal rotation R greater trochanter L2   Shoulder external rotation 35 70   Elbow flexion     Elbow extension     Wrist flexion     Wrist extension     Wrist ulnar deviation     Wrist radial deviation     Wrist pronation     Wrist supination     (Blank rows = not tested)  UPPER EXTREMITY MMT:  MMT Right eval Left eval  Shoulder flexion 3 4  Shoulder extension    Shoulder abduction 2+ 4  Shoulder adduction    Shoulder internal rotation 3 4  Shoulder external rotation 2+ 4  Middle trapezius    Lower trapezius    Elbow flexion    Elbow extension    Wrist flexion    Wrist extension    Wrist ulnar deviation    Wrist radial deviation    Wrist pronation    Wrist supination    Grip strength (lbs)    (Blank rows = not tested)  SHOULDER SPECIAL TESTS: DNT due to pain  JOINT MOBILITY TESTING:  GH hypomobility  PALPATION:  TTP to R infraspinatus   TREATMENT: [facility] Adult PT Treatment:                                                DATE: 01/06/24  Therapeutic Activity: Pulleys OH Discussed correct Progression of therex  Wall slides for flexion and scaption  Shoulder ER and IR isometrics 5 sec x 10 each  Supine shoulder flexion AROM to 90  Supine AAROM shoulder flexion vs clasped arm -more apprehensive in this position.    Modalities: Cold pack right shoulder x  8 min     [facility] Adult PT Treatment:                                                DATE: 01/01/2024 Therapeutic Exercise: R shoulder IR/ER iso x 5 - 5" hold Supine R shoulder flex to 90 deg x 5 Row x 5 GTG  PATIENT EDUCATION: Education details: eval findings, Quick DASH, HEP, POC Person educated: Patient Education method: Explanation, Demonstration, and Handouts Education comprehension: verbalized understanding and returned demonstration  HOME EXERCISE PROGRAM: Access Code: [email or web address] URL: [email or web address] Date: 01/01/2024 Prepared by: Loral Roch  Exercises - Standing Isometric Shoulder External Rotation with Doorway and Towel Roll  - 1 x daily - 7 x weekly - 2-3 sets - 10 reps - 5 sec hold - Standing Isometric Shoulder Internal Rotation with Towel Roll at Doorway  - 1 x daily - 7 x weekly - 2-3 sets - 10 reps - 5 sec hold - Supine Shoulder Flexion Extension Full Range AROM  - 1 x daily - 7 x weekly - 3 sets - 10 reps - Standing Shoulder Row with Anchored Resistance  - 1 x daily - 7 x weekly - 3 sets - 10 reps - green band hold  ASSESSMENT:  CLINICAL IMPRESSION:  Pt reports increased pain after performing an exercise that was not prescribed using the theraband he was given last visit (ER).  Reviewed HEP today and discussed progression of therex and benefits of following prescribed HEP to avoid injury. Pt verbalized understanding. He was able to perform AAROM with good tolerance and this was added to HEP.    EVAL: Patient is a 48 y.o. M who was seen today for physical therapy evaluation and treatment for chronic increasing R shoulder pain and discomfort. Physical findings are consistent with physician impression as pt demonstrates decrease in R shoulder strength, ROM, and comfort. Quick DASH score demonstrates severe disability in performance of home ADLs and community activities. Pt would benefit from skilled PT services  working on improving RTC strength and stability in order to decrease R shoulder pain.   OBJECTIVE IMPAIRMENTS: decreased activity tolerance, decreased endurance, decreased mobility, decreased ROM, decreased strength, postural dysfunction, and pain  ACTIVITY LIMITATIONS: carrying, lifting, standing, bathing, toileting, dressing, reach over head, and locomotion level  PARTICIPATION LIMITATIONS: meal prep, cleaning, driving, shopping, community activity, and yard work  PERSONAL FACTORS: Time since onset of injury/illness/exacerbation and 3+ comorbidities: HTN, COPD, Bipolar disorder, schizophrenia  are also affecting patient's functional outcome.   REHAB POTENTIAL: Good  CLINICAL DECISION MAKING: Evolving/moderate complexity  EVALUATION COMPLEXITY: Moderate   GOALS: Goals reviewed with patient? No  SHORT TERM GOALS: Target date: 01/22/2024   Pt will be compliant and knowledgeable with initial HEP for improved comfort and carryover Baseline: initial HEP given Goal status: INITIAL  2.  Pt will self report right shoulder pain no greater than 7/10 for improved comfort and functional ability Baseline: 9/10 at worst Goal status: INITIAL   LONG TERM GOALS: Target date: 02/26/2024  Pt will decrease Quick DASH disability score to no greater than 40% as proxy for functional improvement Baseline: 55% disability  Goal status: INITIAL  2.  Pt will self report R shoulder pain no greater than 3/10 for improved comfort and functional ability Baseline: 9/10 at worst Goal status: INITIAL   3.  Pt will improve ability to  lift 30# KB with no increase in R shoulder pain to simulate improved function with lifting his young children Baseline: unable Goal status: INITIAL  4.  Pt will improve R shoulder flexion to no less than 140 degrees for improved functional ability with OH reaching and home ADLs Baseline:  Goal status: INITIAL  5.  Pt will improve R shoulder MMT to no less than 4/5 for all  tested motions for improved comfort and dynamic stability Baseline: see MMT chart Goal status: INITIAL  PLAN:  PT FREQUENCY: 1-2x/week  PT DURATION: 8 weeks  PLANNED INTERVENTIONS: 97164- PT Re-evaluation, 97110-Therapeutic exercises, 97530- Therapeutic activity, [id number]- Neuromuscular re-education, 97535- Self Care, 16109- Manual therapy, G0283- Electrical stimulation (unattended), [id number]- Electrical stimulation (manual), 97016- Vasopneumatic device, Dry Needling, Cryotherapy, and Moist heat  PLAN FOR NEXT SESSION: assess HEP response, RTC and periscapular strengthening  For all possible CPT codes, reference the Planned Interventions line above.     Check all conditions that are expected to impact treatment: {Conditions expected to impact treatment:Musculoskeletal disorders   If treatment provided at initial evaluation, no treatment charged due to lack of authorization.       Gasper Karst, PTA 01/06/24 10:14 AM Phone: [phone] Fax: [phone]

## 2024-01-07 ENCOUNTER — Other Ambulatory Visit (INDEPENDENT_AMBULATORY_CARE_PROVIDER_SITE_OTHER): Payer: MEDICAID

## 2024-01-07 DIAGNOSIS — E8729 Other acidosis: Secondary | ICD-10-CM

## 2024-01-08 ENCOUNTER — Telehealth: Payer: Self-pay

## 2024-01-08 LAB — BMP8+ANION GAP
Anion Gap: 17 mmol/L (ref 10.0–18.0)
BUN/Creatinine Ratio: 6 — ABNORMAL LOW (ref 9–20)
BUN: 12 mg/dL (ref 6–24)
CO2: 19 mmol/L — ABNORMAL LOW (ref 20–29)
Calcium: 9.1 mg/dL (ref 8.7–10.2)
Chloride: 101 mmol/L (ref 96–106)
Creatinine, Ser: 1.95 mg/dL — ABNORMAL HIGH (ref 0.76–1.27)
Glucose: 137 mg/dL — ABNORMAL HIGH (ref 70–99)
Potassium: 5.4 mmol/L — ABNORMAL HIGH (ref 3.5–5.2)
Sodium: 137 mmol/L (ref 134–144)
eGFR: 42 mL/min/{1.73_m2} — ABNORMAL LOW (ref >59)

## 2024-01-08 NOTE — Therapy (Incomplete)
 OUTPATIENT PHYSICAL THERAPY SHOULDER TREATMENT    Patient Name: Bruce [name] MRN: [id number] DOB:07-Dec-1975, 48 y.o., male Today's Date: 01/08/2024  END OF SESSION:    Past Medical History:  Diagnosis Date   Anxiety disorder    At risk for obstructive sleep apnea 04/15/2018   Bipolar 1 disorder (HCC)    COPD (chronic obstructive pulmonary disease) (HCC)    Depression    Hepatitis B immune 04/15/2018   HLD (hyperlipidemia)    Hypertension    Schizophrenia (HCC)    Past Surgical History:  Procedure Laterality Date   wisdom tooth removal     Patient Active Problem List   Diagnosis Date Noted   Hyperkalemia 12/27/2023   Candidal esophagitis (HCC) 12/27/2023   Personal history of hyperplastic colon polyps 12/26/2023   Renal insufficiency 12/17/2023   Radiculopathy 09/28/2023   Morbid obesity (HCC) 05/22/2023   Snoring 05/02/2023   OSA (obstructive sleep apnea) 03/18/2023   Sciatica 03/13/2023   PTSD (post-traumatic stress disorder) 03/12/2023   Stable angina (HCC) 12/09/2022   Chronic lower back pain 12/07/2022   Pulmonary nodule 09/28/2022   Shoulder pain, right 09/14/2022   Obesity, diabetes, and hypertension syndrome (HCC) 09/14/2022   Healthcare maintenance 08/31/2022   COPD (chronic obstructive pulmonary disease) (HCC) 08/30/2022   Right leg pain 10/20/2021   IBS (irritable bowel syndrome) 05/04/2021   Unexplained night sweats 10/13/2019   Chronic headaches 10/13/2019   Dizziness 10/13/2019   Earlobe lesion, left 07/15/2019   HLD (hyperlipidemia) 03/12/2019   Abscess of apex of dental root complicating chronic inflammation 03/12/2019   Finger pain, left 01/20/2019   HTN (hypertension) 11/12/2018   Tobacco use disorder 05/20/2018   Schizophrenia (HCC) 04/15/2018   Bipolar disorder (HCC) 04/15/2018   Anxiety and depression 04/15/2018   Gastroesophageal reflux disease 04/15/2018   At risk for obstructive sleep apnea 04/15/2018    PCP: Priscella Brooms, DO    REFERRING PROVIDER: Priscella Brooms, DO   REFERRING DIAG: [phone] (ICD-10-CM) - Chronic right shoulder pain   THERAPY DIAG:  No diagnosis found.  Rationale for Evaluation and Treatment: Rehabilitation  ONSET DATE: January 2024  SUBJECTIVE:                                                                                                                                                                                      SUBJECTIVE STATEMENT ***   EVAL: Pt presents to PT with reports of chronic R shoulder pain after fall onto shoulder in January of 2024. Notes pain in lateral/posterior shoulder and is much worse with walking and lifting his children. Has some occasional R hand numbness though this is infrequent. Pain  has been gradually getting worse.   Hand dominance: Right  PERTINENT HISTORY: HTN, COPD, Bipolar disorder, schizophrenia   PAIN:  Are you having pain?  Yes: NPRS scale: 0/10 Worst: 9/10 Pain location: R posterior/lateral shoulder Pain description: sharp, tigh Aggravating factors: OH reaching, walking Relieving factors: medication, rest  PRECAUTIONS: None  RED FLAGS: None   WEIGHT BEARING RESTRICTIONS: No  FALLS:  Has patient fallen in last 6 months? No  LIVING ENVIRONMENT: Lives with: lives with their family Lives in: House/apartment  OCCUPATION: Not working  PLOF: Independent  PATIENT GOALS: decrease R shoulder pain, be able to reach and sleep better  NEXT MD VISIT:   OBJECTIVE:  Note: Objective measures were completed at Evaluation unless otherwise noted.  DIAGNOSTIC FINDINGS:  See imaging   PATIENT SURVEYS:  Quick DASH: 55% disability   COGNITION: Overall cognitive status: Within functional limits for tasks assessed     SENSATION: Light touch: Impaired - R hand N/T  POSTURE: Rounded shoulder/fwd head  UPPER EXTREMITY ROM:   Active ROM Right eval Left eval Right  01/06/24  Shoulder flexion 70 WFL 80  Shoulder  extension     Shoulder abduction 55 WFL 90  Shoulder adduction     Shoulder internal rotation R greater trochanter L2   Shoulder external rotation 35 70   Elbow flexion     Elbow extension     Wrist flexion     Wrist extension     Wrist ulnar deviation     Wrist radial deviation     Wrist pronation     Wrist supination     (Blank rows = not tested)  UPPER EXTREMITY MMT:  MMT Right eval Left eval  Shoulder flexion 3 4  Shoulder extension    Shoulder abduction 2+ 4  Shoulder adduction    Shoulder internal rotation 3 4  Shoulder external rotation 2+ 4  Middle trapezius    Lower trapezius    Elbow flexion    Elbow extension    Wrist flexion    Wrist extension    Wrist ulnar deviation    Wrist radial deviation    Wrist pronation    Wrist supination    Grip strength (lbs)    (Blank rows = not tested)  SHOULDER SPECIAL TESTS: DNT due to pain  JOINT MOBILITY TESTING:  GH hypomobility  PALPATION:  TTP to R infraspinatus   TREATMENT: [facility] Adult PT Treatment:                                                DATE: 01/08/24 Therapeutic Activity: [place]ys [place] Discussed correct Progression of therex  Wall slides for flexion and scaption  Shoulder ER and IR isometrics 5 sec x 10 each  Supine shoulder flexion AROM to 90  Supine AAROM shoulder flexion vs clasped arm -more apprehensive in this position.  Modalities: Cold pack right shoulder x 8 min  [facility] Adult PT Treatment:                                                DATE: 01/06/24 Therapeutic Activity: [place] Discussed correct Progression of therex  Wall slides for flexion and scaption  Shoulder ER and IR isometrics 5 sec  x 10 each  Supine shoulder flexion AROM to 90  Supine AAROM shoulder flexion vs clasped arm -more apprehensive in this position.  Modalities: Cold pack right shoulder x 8 min   [facility] Adult PT Treatment:                                                DATE: 01/01/2024 Therapeutic Exercise: R  shoulder IR/ER iso x 5 - 5" hold Supine R shoulder flex to 90 deg x 5 Row x 5 GTG  PATIENT EDUCATION: Education details: eval findings, Quick DASH, HEP, POC Person educated: Patient Education method: Explanation, Demonstration, and Handouts Education comprehension: verbalized understanding and returned demonstration  HOME EXERCISE PROGRAM: Access Code: [email or web address] URL: [email or web address] Date: 01/01/2024 Prepared by: Loral Roch  Exercises - Standing Isometric Shoulder External Rotation with Doorway and Towel Roll  - 1 x daily - 7 x weekly - 2-3 sets - 10 reps - 5 sec hold - Standing Isometric Shoulder Internal Rotation with Towel Roll at Doorway  - 1 x daily - 7 x weekly - 2-3 sets - 10 reps - 5 sec hold - Supine Shoulder Flexion Extension Full Range AROM  - 1 x daily - 7 x weekly - 3 sets - 10 reps - Standing Shoulder Row with Anchored Resistance  - 1 x daily - 7 x weekly - 3 sets - 10 reps - green band hold  ASSESSMENT:  CLINICAL IMPRESSION: ***   EVAL: Patient is a 48 y.o. M who was seen today for physical therapy evaluation and treatment for chronic increasing R shoulder pain and discomfort. Physical findings are consistent with physician impression as pt demonstrates decrease in R shoulder strength, ROM, and comfort. Quick DASH score demonstrates severe disability in performance of home ADLs and community activities. Pt would benefit from skilled PT services working on improving RTC strength and stability in order to decrease R shoulder pain.   OBJECTIVE IMPAIRMENTS: decreased activity tolerance, decreased endurance, decreased mobility, decreased ROM, decreased strength, postural dysfunction, and pain  ACTIVITY LIMITATIONS: carrying, lifting, standing, bathing, toileting, dressing, reach over head, and locomotion level  PARTICIPATION LIMITATIONS: meal prep, cleaning, driving, shopping, community activity, and yard work  PERSONAL FACTORS: Time since onset of  injury/illness/exacerbation and 3+ comorbidities: HTN, COPD, Bipolar disorder, schizophrenia  are also affecting patient's functional outcome.   REHAB POTENTIAL: Good  CLINICAL DECISION MAKING: Evolving/moderate complexity  EVALUATION COMPLEXITY: Moderate   GOALS: Goals reviewed with patient? No  SHORT TERM GOALS: Target date: 01/22/2024   Pt will be compliant and knowledgeable with initial HEP for improved comfort and carryover Baseline: initial HEP given Goal status: INITIAL  2.  Pt will self report right shoulder pain no greater than 7/10 for improved comfort and functional ability Baseline: 9/10 at worst Goal status: INITIAL   LONG TERM GOALS: Target date: 02/26/2024  Pt will decrease Quick DASH disability score to no greater than 40% as proxy for functional improvement Baseline: 55% disability  Goal status: INITIAL  2.  Pt will self report R shoulder pain no greater than 3/10 for improved comfort and functional ability Baseline: 9/10 at worst Goal status: INITIAL   3.  Pt will improve ability to lift 30# KB with no increase in R shoulder pain to simulate improved function with lifting his young children Baseline: unable Goal status: INITIAL  4.  Pt will  improve R shoulder flexion to no less than 140 degrees for improved functional ability with OH reaching and home ADLs Baseline:  Goal status: INITIAL  5.  Pt will improve R shoulder MMT to no less than 4/5 for all tested motions for improved comfort and dynamic stability Baseline: see MMT chart Goal status: INITIAL  PLAN:  PT FREQUENCY: 1-2x/week  PT DURATION: 8 weeks  PLANNED INTERVENTIONS: 97164- PT Re-evaluation, 97110-Therapeutic exercises, 97530- Therapeutic activity, [id number]- Neuromuscular re-education, 97535- Self Care, 82956- Manual therapy, G0283- Electrical stimulation (unattended), [id number]- Electrical stimulation (manual), 97016- Vasopneumatic device, Dry Needling, Cryotherapy, and Moist heat  PLAN FOR NEXT  SESSION: assess HEP response, RTC and periscapular strengthening  For all possible CPT codes, reference the Planned Interventions line above.     Check all conditions that are expected to impact treatment: {Conditions expected to impact treatment:Musculoskeletal disorders   If treatment provided at initial evaluation, no treatment charged due to lack of authorization.       Ivor Mars PT  01/08/24 7:49 AM

## 2024-01-08 NOTE — Telephone Encounter (Signed)
 PT called and left voicemail message regarding missed visit on 01/08/2024. Explained attendance policy and left reminder of next appointment.   Ivor Mars   01/08/24 9:55 AM

## 2024-01-09 ENCOUNTER — Other Ambulatory Visit: Payer: Self-pay | Admitting: Student

## 2024-01-09 DIAGNOSIS — K219 Gastro-esophageal reflux disease without esophagitis: Secondary | ICD-10-CM

## 2024-01-09 NOTE — Telephone Encounter (Signed)
 Medication sent to pharmacy

## 2024-01-12 ENCOUNTER — Ambulatory Visit: Payer: Self-pay | Admitting: Student

## 2024-01-12 ENCOUNTER — Telehealth: Payer: Self-pay | Admitting: Student

## 2024-01-12 DIAGNOSIS — E875 Hyperkalemia: Secondary | ICD-10-CM

## 2024-01-12 NOTE — Telephone Encounter (Addendum)
 Called patient to discuss lab results, multiple failed attempts to reach him.  His labs showed potassium of 5.4, and worsened kidney function SCr 1.95.  Denies chest pain, palpitation or muscle weakness.  At his last office visit, I encouraged him to use Tylenol  for shoulder pain, due to the risk of ibuprofen  causing kidney injury. Per my telephone conversation with him today, he has been using NSAIDs 500 mg up to 4 times a day for shoulder pain for the past 7 days. I have again advised patient to stop using NSAIDs for shoulder pain. He can use tylenol  or Voltaren gel for the shoulder pain.  Will order a repeat BMP, open to swinging by [facility]  week 26th-30th.

## 2024-01-15 ENCOUNTER — Ambulatory Visit: Payer: MEDICAID | Admitting: Physical Therapy

## 2024-01-20 ENCOUNTER — Ambulatory Visit: Payer: MEDICAID | Attending: Internal Medicine

## 2024-01-20 DIAGNOSIS — M25511 Pain in right shoulder: Secondary | ICD-10-CM | POA: Insufficient documentation

## 2024-01-20 DIAGNOSIS — G8929 Other chronic pain: Secondary | ICD-10-CM | POA: Diagnosis present

## 2024-01-20 DIAGNOSIS — M6281 Muscle weakness (generalized): Secondary | ICD-10-CM | POA: Diagnosis present

## 2024-01-20 DIAGNOSIS — R293 Abnormal posture: Secondary | ICD-10-CM | POA: Diagnosis present

## 2024-01-20 NOTE — Therapy (Signed)
 OUTPATIENT PHYSICAL THERAPY SHOULDER TREATMENT    Patient Name: Bruce [name] MRN: [id number] DOB:1976-07-02, 48 y.o., male Today's Date: 01/20/2024  END OF SESSION:  PT End of Session - 01/20/24 1014     Visit Number 3    Number of Visits 17    Date for PT Re-Evaluation 02/26/24    Authorization Type [employer]    Authorization Time Period 01/01/24-02/15/24    Authorization - Visit Number 3    Authorization - Number of Visits 12    PT Start Time 1015    PT Stop Time 1056    PT Time Calculation (min) 41 min              Past Medical History:  Diagnosis Date   Anxiety disorder    At risk for obstructive sleep apnea 04/15/2018   Bipolar 1 disorder (HCC)    COPD (chronic obstructive pulmonary disease) (HCC)    Depression    Hepatitis B immune 04/15/2018   HLD (hyperlipidemia)    Hypertension    Schizophrenia (HCC)    Past Surgical History:  Procedure Laterality Date   wisdom tooth removal     Patient Active Problem List   Diagnosis Date Noted   Hyperkalemia 12/27/2023   Candidal esophagitis (HCC) 12/27/2023   Personal history of hyperplastic colon polyps 12/26/2023   Renal insufficiency 12/17/2023   Radiculopathy 09/28/2023   Morbid obesity (HCC) 05/22/2023   Snoring 05/02/2023   OSA (obstructive sleep apnea) 03/18/2023   Sciatica 03/13/2023   PTSD (post-traumatic stress disorder) 03/12/2023   Stable angina (HCC) 12/09/2022   Chronic lower back pain 12/07/2022   Pulmonary nodule 09/28/2022   Shoulder pain, right 09/14/2022   Obesity, diabetes, and hypertension syndrome (HCC) 09/14/2022   Healthcare maintenance 08/31/2022   COPD (chronic obstructive pulmonary disease) (HCC) 08/30/2022   Right leg pain 10/20/2021   IBS (irritable bowel syndrome) 05/04/2021   Unexplained night sweats 10/13/2019   Chronic headaches 10/13/2019   Dizziness 10/13/2019   Earlobe lesion, left 07/15/2019   HLD (hyperlipidemia) 03/12/2019   Abscess of apex of dental root  complicating chronic inflammation 03/12/2019   Finger pain, left 01/20/2019   HTN (hypertension) 11/12/2018   Tobacco use disorder 05/20/2018   Schizophrenia (HCC) 04/15/2018   Bipolar disorder (HCC) 04/15/2018   Anxiety and depression 04/15/2018   Gastroesophageal reflux disease 04/15/2018   At risk for obstructive sleep apnea 04/15/2018    PCP: Priscella Brooms, DO   REFERRING PROVIDER: Priscella Brooms, DO   REFERRING DIAG: [phone] (ICD-10-CM) - Chronic right shoulder pain   THERAPY DIAG:  Chronic right shoulder pain  Muscle weakness (generalized)  Abnormal posture  Rationale for Evaluation and Treatment: Rehabilitation  ONSET DATE: January 2024  SUBJECTIVE:  SUBJECTIVE STATEMENT Pt presents to PT with reports of continued R shoulder pain. Has been compliant with HEP per report.   EVAL: Pt presents to PT with reports of chronic R shoulder pain after fall onto shoulder in January of 2024. Notes pain in lateral/posterior shoulder and is much worse with walking and lifting his children. Has some occasional R hand numbness though this is infrequent. Pain has been gradually getting worse.   Hand dominance: Right  PERTINENT HISTORY: HTN, COPD, Bipolar disorder, schizophrenia   PAIN:  Are you having pain?  Yes: NPRS scale: 3/10 Worst: 9/10 Pain location: R posterior/lateral shoulder Pain description: sharp, tigh Aggravating factors: OH reaching, walking Relieving factors: medication, rest  PRECAUTIONS: None  RED FLAGS: None   WEIGHT BEARING RESTRICTIONS: No  FALLS:  Has patient fallen in last 6 months? No  LIVING ENVIRONMENT: Lives with: lives with their family Lives in: House/apartment  OCCUPATION: Not working  PLOF: Independent  PATIENT GOALS: decrease R shoulder pain,  be able to reach and sleep better  NEXT MD VISIT:   OBJECTIVE:  Note: Objective measures were completed at Evaluation unless otherwise noted.  DIAGNOSTIC FINDINGS:  See imaging   PATIENT SURVEYS:  Quick DASH: 55% disability   COGNITION: Overall cognitive status: Within functional limits for tasks assessed     SENSATION: Light touch: Impaired - R hand N/T  POSTURE: Rounded shoulder/fwd head  UPPER EXTREMITY ROM:   Active ROM Right eval Left eval Right  01/06/24  Shoulder flexion 70 WFL 80  Shoulder extension     Shoulder abduction 55 WFL 90  Shoulder adduction     Shoulder internal rotation R greater trochanter L2   Shoulder external rotation 35 70   Elbow flexion     Elbow extension     Wrist flexion     Wrist extension     Wrist ulnar deviation     Wrist radial deviation     Wrist pronation     Wrist supination     (Blank rows = not tested)  UPPER EXTREMITY MMT:  MMT Right eval Left eval  Shoulder flexion 3 4  Shoulder extension    Shoulder abduction 2+ 4  Shoulder adduction    Shoulder internal rotation 3 4  Shoulder external rotation 2+ 4  Middle trapezius    Lower trapezius    Elbow flexion    Elbow extension    Wrist flexion    Wrist extension    Wrist ulnar deviation    Wrist radial deviation    Wrist pronation    Wrist supination    Grip strength (lbs)    (Blank rows = not tested)  SHOULDER SPECIAL TESTS: DNT due to pain  JOINT MOBILITY TESTING:  GH hypomobility  PALPATION:  TTP to R infraspinatus   TREATMENT: [facility] Adult PT Treatment:                                                DATE: 01/20/24 UBE lvl 1.0 fwd x 3 min for functional activity tolerance Supine R shoulder flexion 3x10 S/L shoulder abd 2x10 R S/L shoulder ER 2x10 R Standing row 2x10 blue band Standing ext 2x10 GTB [place] into flexion x 2 min R shoulder ER isometric x 10 - 5" hold Farmer's carry x 185ft 5# in R hand  [facility] Adult PT Treatment:  DATE: 01/06/24 Therapeutic Activity: [place] Discussed correct Progression of therex  Wall slides for flexion and scaption  Shoulder ER and IR isometrics 5 sec x 10 each  Supine shoulder flexion AROM to 90  Supine AAROM shoulder flexion vs clasped arm -more apprehensive in this position.  Modalities: Cold pack right shoulder x 8 min   [facility] Adult PT Treatment:                                                DATE: 01/01/2024 Therapeutic Exercise: R shoulder IR/ER iso x 5 - 5" hold Supine R shoulder flex to 90 deg x 5 Row x 5 GTG  PATIENT EDUCATION: Education details: eval findings, Quick DASH, HEP, POC Person educated: Patient Education method: Explanation, Demonstration, and Handouts Education comprehension: verbalized understanding and returned demonstration  HOME EXERCISE PROGRAM: Access Code: [email or web address] URL: [email or web address] Date: 01/01/2024 Prepared by: Loral Roch  Exercises - Standing Isometric Shoulder External Rotation with Doorway and Towel Roll  - 1 x daily - 7 x weekly - 2-3 sets - 10 reps - 5 sec hold - Standing Isometric Shoulder Internal Rotation with Towel Roll at Doorway  - 1 x daily - 7 x weekly - 2-3 sets - 10 reps - 5 sec hold - Supine Shoulder Flexion Extension Full Range AROM  - 1 x daily - 7 x weekly - 3 sets - 10 reps - Standing Shoulder Row with Anchored Resistance  - 1 x daily - 7 x weekly - 3 sets - 10 reps - green band hold  ASSESSMENT:  CLINICAL IMPRESSION: Pt was able to complete prescribed exercises today with no adverse effect. Therapy today focused on improving R shoulder function with increased OH reach and strength of stabilizing RTC musculature in order to decrease pain. Pt is progressing with therapy and continues to benefit from skilled services. PT will continue to progress as able per POC.   EVAL: Patient is a 48 y.o. M who was seen today for physical therapy evaluation and treatment for  chronic increasing R shoulder pain and discomfort. Physical findings are consistent with physician impression as pt demonstrates decrease in R shoulder strength, ROM, and comfort. Quick DASH score demonstrates severe disability in performance of home ADLs and community activities. Pt would benefit from skilled PT services working on improving RTC strength and stability in order to decrease R shoulder pain.   OBJECTIVE IMPAIRMENTS: decreased activity tolerance, decreased endurance, decreased mobility, decreased ROM, decreased strength, postural dysfunction, and pain  ACTIVITY LIMITATIONS: carrying, lifting, standing, bathing, toileting, dressing, reach over head, and locomotion level  PARTICIPATION LIMITATIONS: meal prep, cleaning, driving, shopping, community activity, and yard work  PERSONAL FACTORS: Time since onset of injury/illness/exacerbation and 3+ comorbidities: HTN, COPD, Bipolar disorder, schizophrenia  are also affecting patient's functional outcome.   REHAB POTENTIAL: Good  CLINICAL DECISION MAKING: Evolving/moderate complexity  EVALUATION COMPLEXITY: Moderate   GOALS: Goals reviewed with patient? No  SHORT TERM GOALS: Target date: 01/22/2024   Pt will be compliant and knowledgeable with initial HEP for improved comfort and carryover Baseline: initial HEP given Goal status: INITIAL  2.  Pt will self report right shoulder pain no greater than 7/10 for improved comfort and functional ability Baseline: 9/10 at worst Goal status: INITIAL   LONG TERM GOALS: Target date: 02/26/2024  Pt will decrease Quick DASH disability score to no  greater than 40% as proxy for functional improvement Baseline: 55% disability  Goal status: INITIAL  2.  Pt will self report R shoulder pain no greater than 3/10 for improved comfort and functional ability Baseline: 9/10 at worst Goal status: INITIAL   3.  Pt will improve ability to lift 30# KB with no increase in R shoulder pain to simulate  improved function with lifting his young children Baseline: unable Goal status: INITIAL  4.  Pt will improve R shoulder flexion to no less than 140 degrees for improved functional ability with OH reaching and home ADLs Baseline:  Goal status: INITIAL  5.  Pt will improve R shoulder MMT to no less than 4/5 for all tested motions for improved comfort and dynamic stability Baseline: see MMT chart Goal status: INITIAL  PLAN:  PT FREQUENCY: 1-2x/week  PT DURATION: 8 weeks  PLANNED INTERVENTIONS: 97164- PT Re-evaluation, 97110-Therapeutic exercises, 97530- Therapeutic activity, [id number]- Neuromuscular re-education, 97535- Self Care, 96045- Manual therapy, G0283- Electrical stimulation (unattended), [id number]- Electrical stimulation (manual), 97016- Vasopneumatic device, Dry Needling, Cryotherapy, and Moist heat  PLAN FOR NEXT SESSION: assess HEP response, RTC and periscapular strengthening  For all possible CPT codes, reference the Planned Interventions line above.     Check all conditions that are expected to impact treatment: {Conditions expected to impact treatment:Musculoskeletal disorders   If treatment provided at initial evaluation, no treatment charged due to lack of authorization.       Ivor Mars PT  01/20/24 11:01 AM

## 2024-01-21 ENCOUNTER — Ambulatory Visit: Payer: Self-pay

## 2024-01-21 NOTE — Telephone Encounter (Signed)
 Called pt - no answer; left message to call the office about pt's request for an appt. Pt has an appt 6/12; next sooner appt is 6/10 at this time.

## 2024-01-21 NOTE — Telephone Encounter (Signed)
  Chief Complaint: back pain, sores in/on nose Symptoms: back pain when supine, crusty sores in/around opening to nose Frequency: back pain-2 months, sore-2 days Pertinent Negatives: Patient denies fever, abd pain, GU s/s, injury to the back Disposition: [] ED /[] Urgent Care (no appt availability in office) / [x] Appointment(In office/virtual)/ []  [facility]/ [] Home Care/ [] Refused Recommended Disposition /[] [facility]/ []  Follow-up with PCP Additional Notes: Pt states that he believes he has a problem with his kidneys. Denies GU s/s. Routing HP to clinic for scheduling.  Copied from CRM [id number]. Topic: Clinical - Red Word Triage >> Jan 21, 2024 11:43 AM Retta Caster wrote: Red Word that prompted transfer to Nurse Triage: Patient  [place] in pain 2-3 month but last 1-2 days worse pain/Nose has dried blood and irritation in side Reason for Disposition  Sores and crusts are around openings to nose, or anywhere else on face  [1] MODERATE back pain (e.g., interferes with normal activities) AND [2] present > 3 days  Answer Assessment - Initial Assessment Questions 1. ONSET: "When did the pain begin?"      About 2 months 2. LOCATION: "Where does it hurt?" (upper, mid or lower back)     Lower back-ribs and buttocks 3. SEVERITY: "How bad is the pain?"  (e.g., Scale 1-10; mild, moderate, or severe)   - MILD (1-3): Doesn't interfere with normal activities.    - MODERATE (4-7): Interferes with normal activities or awakens from sleep.    - SEVERE (8-10): Excruciating pain, unable to do any normal activities.      When supine 8 4. PATTERN: "Is the pain constant?" (e.g., yes, no; constant, intermittent)      intermittent 5. RADIATION: "Does the pain shoot into your legs or somewhere else?"     denies 6. CAUSE:  "What do you think is causing the back pain?"      kidneys 7. BACK OVERUSE:  "Any recent lifting of heavy objects, strenuous work or exercise?"     Medical treatments 8.  MEDICINES: "What have you taken so far for the pain?" (e.g., nothing, acetaminophen , NSAIDS)     Has taken gabapentin  but not sure if it is helping 9. NEUROLOGIC SYMPTOMS: "Do you have any weakness, numbness, or problems with bowel/bladder control?"     Weakness to the legs for about 2 months 10. OTHER SYMPTOMS: "Do you have any other symptoms?" (e.g., fever, abdomen pain, burning with urination, blood in urine)       denies  Answer Assessment - Initial Assessment Questions 1. APPEARANCE of IMPETIGO: "What does the rash look like?"      Hard, blood crusty near nose 2. LOCATION: "Where is the rash located?"      nose 3. NUMBER: "How many sores are there?"     Multiple  4. SIZE: "How big is the largest sore?" (inches, cm or compare to size of a coin)     BB 5. ONSET: "When did the sores start?"     recently 6. OTHER SYMPTOMS: "Do you have any other symptoms?" (e.g., fever, pain)     denies  Protocols used: Back Pain-A-AH, Impetigo (Infected Sore)-A-AH

## 2024-01-22 ENCOUNTER — Ambulatory Visit: Payer: MEDICAID | Admitting: Physical Therapy

## 2024-01-24 ENCOUNTER — Ambulatory Visit (HOSPITAL_COMMUNITY)
Admission: RE | Admit: 2024-01-24 | Discharge: 2024-01-24 | Disposition: A | Discharge status: Home / Self Care | Payer: MEDICAID | Source: Ambulatory Visit | Attending: Internal Medicine | Admitting: Internal Medicine

## 2024-01-24 DIAGNOSIS — N289 Disorder of kidney and ureter, unspecified: Secondary | ICD-10-CM | POA: Insufficient documentation

## 2024-01-27 ENCOUNTER — Ambulatory Visit: Payer: MEDICAID

## 2024-01-27 DIAGNOSIS — M25511 Pain in right shoulder: Secondary | ICD-10-CM | POA: Diagnosis not present

## 2024-01-27 DIAGNOSIS — G8929 Other chronic pain: Secondary | ICD-10-CM

## 2024-01-27 DIAGNOSIS — R293 Abnormal posture: Secondary | ICD-10-CM

## 2024-01-27 DIAGNOSIS — M6281 Muscle weakness (generalized): Secondary | ICD-10-CM

## 2024-01-27 NOTE — Therapy (Signed)
 OUTPATIENT PHYSICAL THERAPY SHOULDER TREATMENT    Patient Name: Bruce [name] MRN: [id number] DOB:06/25/1976, 48 y.o., male Today's Date: 01/27/2024  END OF SESSION:  PT End of Session - 01/27/24 0947     Visit Number 4    Number of Visits 17    Date for PT Re-Evaluation 02/26/24    Authorization Type [employer]    Authorization Time Period 01/01/24-02/15/24    Authorization - Visit Number 4    Authorization - Number of Visits 12    PT Start Time 1015    PT Stop Time 1055    PT Time Calculation (min) 40 min               Past Medical History:  Diagnosis Date   Anxiety disorder    At risk for obstructive sleep apnea 04/15/2018   Bipolar 1 disorder (HCC)    COPD (chronic obstructive pulmonary disease) (HCC)    Depression    Hepatitis B immune 04/15/2018   HLD (hyperlipidemia)    Hypertension    Schizophrenia (HCC)    Past Surgical History:  Procedure Laterality Date   wisdom tooth removal     Patient Active Problem List   Diagnosis Date Noted   Hyperkalemia 12/27/2023   Candidal esophagitis (HCC) 12/27/2023   Personal history of hyperplastic colon polyps 12/26/2023   Renal insufficiency 12/17/2023   Radiculopathy 09/28/2023   Morbid obesity (HCC) 05/22/2023   Snoring 05/02/2023   OSA (obstructive sleep apnea) 03/18/2023   Sciatica 03/13/2023   PTSD (post-traumatic stress disorder) 03/12/2023   Stable angina (HCC) 12/09/2022   Chronic lower back pain 12/07/2022   Pulmonary nodule 09/28/2022   Shoulder pain, right 09/14/2022   Obesity, diabetes, and hypertension syndrome (HCC) 09/14/2022   Healthcare maintenance 08/31/2022   COPD (chronic obstructive pulmonary disease) (HCC) 08/30/2022   Right leg pain 10/20/2021   IBS (irritable bowel syndrome) 05/04/2021   Unexplained night sweats 10/13/2019   Chronic headaches 10/13/2019   Dizziness 10/13/2019   Earlobe lesion, left 07/15/2019   HLD (hyperlipidemia) 03/12/2019   Abscess of apex of dental root  complicating chronic inflammation 03/12/2019   Finger pain, left 01/20/2019   HTN (hypertension) 11/12/2018   Tobacco use disorder 05/20/2018   Schizophrenia (HCC) 04/15/2018   Bipolar disorder (HCC) 04/15/2018   Anxiety and depression 04/15/2018   Gastroesophageal reflux disease 04/15/2018   At risk for obstructive sleep apnea 04/15/2018    PCP: Priscella Brooms, DO   REFERRING PROVIDER: Priscella Brooms, DO   REFERRING DIAG: [phone] (ICD-10-CM) - Chronic right shoulder pain   THERAPY DIAG:  Chronic right shoulder pain  Muscle weakness (generalized)  Abnormal posture  Rationale for Evaluation and Treatment: Rehabilitation  ONSET DATE: January 2024  SUBJECTIVE:  SUBJECTIVE STATEMENT Pt presents to PT with reports of continued R shoulder pain. Has continued to work on on HEP fairly consistently.   EVAL: Pt presents to PT with reports of chronic R shoulder pain after fall onto shoulder in January of 2024. Notes pain in lateral/posterior shoulder and is much worse with walking and lifting his children. Has some occasional R hand numbness though this is infrequent. Pain has been gradually getting worse.   Hand dominance: Right  PERTINENT HISTORY: HTN, COPD, Bipolar disorder, schizophrenia   PAIN:  Are you having pain?  Yes: NPRS scale: 3/10 Worst: 9/10 Pain location: R posterior/lateral shoulder Pain description: sharp, tigh Aggravating factors: OH reaching, walking Relieving factors: medication, rest  PRECAUTIONS: None  RED FLAGS: None   WEIGHT BEARING RESTRICTIONS: No  FALLS:  Has patient fallen in last 6 months? No  LIVING ENVIRONMENT: Lives with: lives with their family Lives in: House/apartment  OCCUPATION: Not working  PLOF: Independent  PATIENT GOALS: decrease R  shoulder pain, be able to reach and sleep better  NEXT MD VISIT:   OBJECTIVE:  Note: Objective measures were completed at Evaluation unless otherwise noted.  DIAGNOSTIC FINDINGS:  See imaging   PATIENT SURVEYS:  Quick DASH: 55% disability   COGNITION: Overall cognitive status: Within functional limits for tasks assessed     SENSATION: Light touch: Impaired - R hand N/T  POSTURE: Rounded shoulder/fwd head  UPPER EXTREMITY ROM:   Active ROM Right eval Left eval Right  01/06/24  Shoulder flexion 70 WFL 80  Shoulder extension     Shoulder abduction 55 WFL 90  Shoulder adduction     Shoulder internal rotation R greater trochanter L2   Shoulder external rotation 35 70   Elbow flexion     Elbow extension     Wrist flexion     Wrist extension     Wrist ulnar deviation     Wrist radial deviation     Wrist pronation     Wrist supination     (Blank rows = not tested)  UPPER EXTREMITY MMT:  MMT Right eval Left eval  Shoulder flexion 3 4  Shoulder extension    Shoulder abduction 2+ 4  Shoulder adduction    Shoulder internal rotation 3 4  Shoulder external rotation 2+ 4  Middle trapezius    Lower trapezius    Elbow flexion    Elbow extension    Wrist flexion    Wrist extension    Wrist ulnar deviation    Wrist radial deviation    Wrist pronation    Wrist supination    Grip strength (lbs)    (Blank rows = not tested)  SHOULDER SPECIAL TESTS: DNT due to pain  JOINT MOBILITY TESTING:  GH hypomobility  PALPATION:  TTP to R infraspinatus   TREATMENT: [facility] Adult PT Treatment:                                                DATE: 01/27/24 UBE lvl 1.0 fwd x 4 min for functional activity tolerance Supine R shoulder flexion 2x10 Inclined R shoulder flexion x 10 Supine horizontal abd 2x10 RTB  S/L shoulder abd 2x10 R S/L shoulder ER 2x10 R 2# S/L shoulder flex 2x10 R Standing row 2x10 blue band Standing R ER 2x10 YTB [place] into flexion x 60"  [facility]  Adult  PT Treatment:                                                DATE: 01/20/24 UBE lvl 1.0 fwd x 3 min for functional activity tolerance Supine R shoulder flexion 3x10 S/L shoulder abd 2x10 R S/L shoulder ER 2x10 R Standing row 2x10 blue band Standing ext 2x10 GTB [place] into flexion x 2 min R shoulder ER isometric x 10 - 5" hold Farmer's carry x 159ft 5# in R hand  [facility] Adult PT Treatment:                                                DATE: 01/06/24 Therapeutic Activity: [place] Discussed correct Progression of therex  Wall slides for flexion and scaption  Shoulder ER and IR isometrics 5 sec x 10 each  Supine shoulder flexion AROM to 90  Supine AAROM shoulder flexion vs clasped arm -more apprehensive in this position.  Modalities: Cold pack right shoulder x 8 min   [facility] Adult PT Treatment:                                                DATE: 01/01/2024 Therapeutic Exercise: R shoulder IR/ER iso x 5 - 5" hold Supine R shoulder flex to 90 deg x 5 Row x 5 GTG  PATIENT EDUCATION: Education details: eval findings, Quick DASH, HEP, POC Person educated: Patient Education method: Explanation, Demonstration, and Handouts Education comprehension: verbalized understanding and returned demonstration  HOME EXERCISE PROGRAM: Access Code: [email or web address] URL: [email or web address] Date: 01/01/2024 Prepared by: Loral Roch  Exercises - Standing Isometric Shoulder External Rotation with Doorway and Towel Roll  - 1 x daily - 7 x weekly - 2-3 sets - 10 reps - 5 sec hold - Standing Isometric Shoulder Internal Rotation with Towel Roll at Doorway  - 1 x daily - 7 x weekly - 2-3 sets - 10 reps - 5 sec hold - Supine Shoulder Flexion Extension Full Range AROM  - 1 x daily - 7 x weekly - 3 sets - 10 reps - Standing Shoulder Row with Anchored Resistance  - 1 x daily - 7 x weekly - 3 sets - 10 reps - green band hold  ASSESSMENT:  CLINICAL IMPRESSION: Pt was able to complete all  prescribed exercises today with no adverse effect. Exercises today continue to focus on progression of R shoulder functional ROM and RTC strength in order to decrease shoulder pain. Progressed ER strengthening today which was tolerated well, may be good to add this to HEP next session. Continues to benefit from skilled therapy, will continue to progress as tolerated.   EVAL: Patient is a 48 y.o. M who was seen today for physical therapy evaluation and treatment for chronic increasing R shoulder pain and discomfort. Physical findings are consistent with physician impression as pt demonstrates decrease in R shoulder strength, ROM, and comfort. Quick DASH score demonstrates severe disability in performance of home ADLs and community activities. Pt would benefit from skilled PT services working on improving RTC strength and stability in order to decrease R  shoulder pain.   OBJECTIVE IMPAIRMENTS: decreased activity tolerance, decreased endurance, decreased mobility, decreased ROM, decreased strength, postural dysfunction, and pain  ACTIVITY LIMITATIONS: carrying, lifting, standing, bathing, toileting, dressing, reach over head, and locomotion level  PARTICIPATION LIMITATIONS: meal prep, cleaning, driving, shopping, community activity, and yard work  PERSONAL FACTORS: Time since onset of injury/illness/exacerbation and 3+ comorbidities: HTN, COPD, Bipolar disorder, schizophrenia  are also affecting patient's functional outcome.   REHAB POTENTIAL: Good  CLINICAL DECISION MAKING: Evolving/moderate complexity  EVALUATION COMPLEXITY: Moderate   GOALS: Goals reviewed with patient? No  SHORT TERM GOALS: Target date: 01/22/2024   Pt will be compliant and knowledgeable with initial HEP for improved comfort and carryover Baseline: initial HEP given Goal status: INITIAL  2.  Pt will self report right shoulder pain no greater than 7/10 for improved comfort and functional ability Baseline: 9/10 at  worst Goal status: INITIAL   LONG TERM GOALS: Target date: 02/26/2024  Pt will decrease Quick DASH disability score to no greater than 40% as proxy for functional improvement Baseline: 55% disability  Goal status: INITIAL  2.  Pt will self report R shoulder pain no greater than 3/10 for improved comfort and functional ability Baseline: 9/10 at worst Goal status: INITIAL   3.  Pt will improve ability to lift 30# KB with no increase in R shoulder pain to simulate improved function with lifting his young children Baseline: unable Goal status: INITIAL  4.  Pt will improve R shoulder flexion to no less than 140 degrees for improved functional ability with OH reaching and home ADLs Baseline:  Goal status: INITIAL  5.  Pt will improve R shoulder MMT to no less than 4/5 for all tested motions for improved comfort and dynamic stability Baseline: see MMT chart Goal status: INITIAL  PLAN:  PT FREQUENCY: 1-2x/week  PT DURATION: 8 weeks  PLANNED INTERVENTIONS: 97164- PT Re-evaluation, 97110-Therapeutic exercises, 97530- Therapeutic activity, [id number]- Neuromuscular re-education, 97535- Self Care, 60454- Manual therapy, [id number]- Electrical stimulation (unattended), [id number]- Electrical stimulation (manual), 97016- Vasopneumatic device, Dry Needling, Cryotherapy, and Moist heat  PLAN FOR NEXT SESSION: assess HEP response, RTC and periscapular strengthening  For all possible CPT codes, reference the Planned Interventions line above.     Check all conditions that are expected to impact treatment: {Conditions expected to impact treatment:Musculoskeletal disorders   If treatment provided at initial evaluation, no treatment charged due to lack of authorization.       Ivor Mars PT  01/27/24 10:59 AM

## 2024-01-29 ENCOUNTER — Encounter: Payer: Self-pay | Admitting: Physical Therapy

## 2024-01-29 ENCOUNTER — Ambulatory Visit: Payer: MEDICAID | Admitting: Physical Therapy

## 2024-01-29 DIAGNOSIS — M6281 Muscle weakness (generalized): Secondary | ICD-10-CM

## 2024-01-29 DIAGNOSIS — M25511 Pain in right shoulder: Secondary | ICD-10-CM | POA: Diagnosis not present

## 2024-01-29 DIAGNOSIS — G8929 Other chronic pain: Secondary | ICD-10-CM

## 2024-01-29 NOTE — Therapy (Addendum)
 OUTPATIENT PHYSICAL THERAPY TREATMENT NOTE/DISCHARGE  PHYSICAL THERAPY DISCHARGE SUMMARY  Visits from Start of Care: 5  Current functional level related to goals / functional outcomes: See goals/objective   Remaining deficits: Unable to assess   Education / Equipment: HEP   Patient agrees to discharge. Patient goals were unable to assess. Patient is being discharged due to not returning since the last visit.     Patient Name: Bruce [name] MRN: [id number] DOB:1975/11/01, 48 y.o., male Today's Date: 01/29/2024  END OF SESSION:  PT End of Session - 01/29/24 1027     Visit Number 5    Number of Visits 17    Date for PT Re-Evaluation 02/26/24    Authorization Type [employer]    Authorization Time Period 01/01/24-02/15/24    Authorization - Visit Number 5    Authorization - Number of Visits 12    PT Start Time 1017    PT Stop Time 1100    PT Time Calculation (min) 43 min                Past Medical History:  Diagnosis Date   Anxiety disorder    At risk for obstructive sleep apnea 04/15/2018   Bipolar 1 disorder (HCC)    COPD (chronic obstructive pulmonary disease) (HCC)    Depression    Hepatitis B immune 04/15/2018   HLD (hyperlipidemia)    Hypertension    Schizophrenia (HCC)    Past Surgical History:  Procedure Laterality Date   wisdom tooth removal     Patient Active Problem List   Diagnosis Date Noted   Hyperkalemia 12/27/2023   Candidal esophagitis (HCC) 12/27/2023   Personal history of hyperplastic colon polyps 12/26/2023   Renal insufficiency 12/17/2023   Radiculopathy 09/28/2023   Morbid obesity (HCC) 05/22/2023   Snoring 05/02/2023   OSA (obstructive sleep apnea) 03/18/2023   Sciatica 03/13/2023   PTSD (post-traumatic stress disorder) 03/12/2023   Stable angina (HCC) 12/09/2022   Chronic lower back pain 12/07/2022   Pulmonary nodule 09/28/2022   Shoulder pain, right 09/14/2022   Obesity, diabetes, and hypertension syndrome (HCC)  09/14/2022   Healthcare maintenance 08/31/2022   COPD (chronic obstructive pulmonary disease) (HCC) 08/30/2022   Right leg pain 10/20/2021   IBS (irritable bowel syndrome) 05/04/2021   Unexplained night sweats 10/13/2019   Chronic headaches 10/13/2019   Dizziness 10/13/2019   Earlobe lesion, left 07/15/2019   HLD (hyperlipidemia) 03/12/2019   Abscess of apex of dental root complicating chronic inflammation 03/12/2019   Finger pain, left 01/20/2019   HTN (hypertension) 11/12/2018   Tobacco use disorder 05/20/2018   Schizophrenia (HCC) 04/15/2018   Bipolar disorder (HCC) 04/15/2018   Anxiety and depression 04/15/2018   Gastroesophageal reflux disease 04/15/2018   At risk for obstructive sleep apnea 04/15/2018    PCP: Rosan Dayton BROCKS, DO   REFERRING PROVIDER: Rosan Dayton BROCKS, DO   REFERRING DIAG: [phone] (ICD-10-CM) - Chronic right shoulder pain   THERAPY DIAG:  Chronic right shoulder pain  Muscle weakness (generalized)  Rationale for Evaluation and Treatment: Rehabilitation  ONSET DATE: January 2024  SUBJECTIVE:  SUBJECTIVE STATEMENT Pt presents to PT with reports of continued R shoulder pain. Has continued to work on on HEP fairly consistently.   EVAL: Pt presents to PT with reports of chronic R shoulder pain after fall onto shoulder in January of 2024. Notes pain in lateral/posterior shoulder and is much worse with walking and lifting his children. Has some occasional R hand numbness though this is infrequent. Pain has been gradually getting worse.   Hand dominance: Right  PERTINENT HISTORY: HTN, COPD, Bipolar disorder, schizophrenia   PAIN:  Are you having pain?  Yes: NPRS scale: 3/10 Worst: 9/10 Pain location: R posterior/lateral shoulder Pain description: sharp,  tigh Aggravating factors: OH reaching, walking Relieving factors: medication, rest  PRECAUTIONS: None  RED FLAGS: None   WEIGHT BEARING RESTRICTIONS: No  FALLS:  Has patient fallen in last 6 months? No  LIVING ENVIRONMENT: Lives with: lives with their family Lives in: House/apartment  OCCUPATION: Not working  PLOF: Independent  PATIENT GOALS: decrease R shoulder pain, be able to reach and sleep better  NEXT MD VISIT:   OBJECTIVE:  Note: Objective measures were completed at Evaluation unless otherwise noted.  DIAGNOSTIC FINDINGS:  See imaging   PATIENT SURVEYS:  Quick DASH: 55% disability   COGNITION: Overall cognitive status: Within functional limits for tasks assessed     SENSATION: Light touch: Impaired - R hand N/T  POSTURE: Rounded shoulder/fwd head  UPPER EXTREMITY ROM:   Active ROM Right eval Left eval Right  01/06/24 Right  01/29/24  Shoulder flexion 70 WFL 80 148  Shoulder extension      Shoulder abduction 55 WFL 90 145  Shoulder adduction      Shoulder internal rotation R greater trochanter L2    Shoulder external rotation 35 70    Elbow flexion      Elbow extension      Wrist flexion      Wrist extension      Wrist ulnar deviation      Wrist radial deviation      Wrist pronation      Wrist supination      (Blank rows = not tested)  UPPER EXTREMITY MMT:  MMT Right eval Left eval  Shoulder flexion 3 4  Shoulder extension    Shoulder abduction 2+ 4  Shoulder adduction    Shoulder internal rotation 3 4  Shoulder external rotation 2+ 4  Middle trapezius    Lower trapezius    Elbow flexion    Elbow extension    Wrist flexion    Wrist extension    Wrist ulnar deviation    Wrist radial deviation    Wrist pronation    Wrist supination    Grip strength (lbs)    (Blank rows = not tested)  SHOULDER SPECIAL TESTS: DNT due to pain  JOINT MOBILITY TESTING:  GH hypomobility  PALPATION:  TTP to R  infraspinatus   TREATMENT: [facility] Adult PT Treatment:                                                DATE: 01/29/24 Therapeutic Exercise: UBE level 1.5 3 minutes each way  Pulleys flex and scap x 1 min each  Wall slides flexion , scap x 10 each  Supine horiz abdct GTB 10 x 2  Supine ER GTB bilat 10 x 2  Supine shoulder  flexion long lever 1# 10 x 2  S/L shoulder abd 2x10 R 1#  Upated HEP     [facility] Adult PT Treatment:                                                DATE: 01/27/24 UBE lvl 1.0 fwd x 4 min for functional activity tolerance Supine R shoulder flexion 2x10 Inclined R shoulder flexion x 10 Supine horizontal abd 2x10 RTB  S/L shoulder abd 2x10 R S/L shoulder ER 2x10 R 2# S/L shoulder flex 2x10 R Standing row 2x10 blue band Standing R ER 2x10 YTB Pulleys OH into flexion x 60  [facility] Adult PT Treatment:                                                DATE: 01/20/24 UBE lvl 1.0 fwd x 3 min for functional activity tolerance Supine R shoulder flexion 3x10 S/L shoulder abd 2x10 R S/L shoulder ER 2x10 R Standing row 2x10 blue band Standing ext 2x10 GTB Pulleys OH into flexion x 2 min R shoulder ER isometric x 10 - 5 hold Farmer's carry x 191ft 5# in R hand  [facility] Adult PT Treatment:                                                DATE: 01/06/24 Therapeutic Activity: Pulleys OH Discussed correct Progression of therex  Wall slides for flexion and scaption  Shoulder ER and IR isometrics 5 sec x 10 each  Supine shoulder flexion AROM to 90  Supine AAROM shoulder flexion vs clasped arm -more apprehensive in this position.  Modalities: Cold pack right shoulder x 8 min     PATIENT EDUCATION: Education details: eval findings, Quick DASH, HEP, POC Person educated: Patient Education method: Explanation, Demonstration, and Handouts Education comprehension: verbalized understanding and returned demonstration  HOME EXERCISE PROGRAM: Access Code: [email or web address] URL:  [email or web address] Date: 01/01/2024 Prepared by: Alm Kingdom  Exercises - Standing Isometric Shoulder External Rotation with Doorway and Towel Roll  - 1 x daily - 7 x weekly - 2-3 sets - 10 reps - 5 sec hold - Standing Isometric Shoulder Internal Rotation with Towel Roll at Doorway  - 1 x daily - 7 x weekly - 2-3 sets - 10 reps - 5 sec hold - Supine Shoulder Flexion Extension Full Range AROM  - 1 x daily - 7 x weekly - 3 sets - 10 reps - Standing Shoulder Row with Anchored Resistance  - 1 x daily - 7 x weekly - 3 sets - 10 reps - green band hold 01/06/24 - Shoulder Flexion Wall Slide with Towel  - 1 x daily - 7 x weekly - 1-2 sets - 10 reps - Standing Shoulder Scaption Wall Walk  - 1 x daily - 7 x weekly - 1-2 sets - 10 reps 01/29/24 - Standing Shoulder External Rotation with Resistance  - 1 x daily - 7 x weekly - 2 sets - 10 reps  ASSESSMENT:  CLINICAL IMPRESSION: Pt was able to complete all prescribed exercises today with no adverse effect. His  AROM has significantly improved. Exercises today continue to focus on progression of R shoulder functional ROM and RTC strength in order to decrease shoulder pain. Progressed ER strengthening today which was tolerated well, updated HEP with reminder or AAROM that was added last month. Continues to benefit from skilled therapy, will continue to progress as tolerated.   EVAL: Patient is a 48 y.o. M who was seen today for physical therapy evaluation and treatment for chronic increasing R shoulder pain and discomfort. Physical findings are consistent with physician impression as pt demonstrates decrease in R shoulder strength, ROM, and comfort. Quick DASH score demonstrates severe disability in performance of home ADLs and community activities. Pt would benefit from skilled PT services working on improving RTC strength and stability in order to decrease R shoulder pain.   OBJECTIVE IMPAIRMENTS: decreased activity tolerance, decreased  endurance, decreased mobility, decreased ROM, decreased strength, postural dysfunction, and pain  ACTIVITY LIMITATIONS: carrying, lifting, standing, bathing, toileting, dressing, reach over head, and locomotion level  PARTICIPATION LIMITATIONS: meal prep, cleaning, driving, shopping, community activity, and yard work  PERSONAL FACTORS: Time since onset of injury/illness/exacerbation and 3+ comorbidities: HTN, COPD, Bipolar disorder, schizophrenia  are also affecting patient's functional outcome.   REHAB POTENTIAL: Good  CLINICAL DECISION MAKING: Evolving/moderate complexity  EVALUATION COMPLEXITY: Moderate   GOALS: Goals reviewed with patient? No  SHORT TERM GOALS: Target date: 01/22/2024   Pt will be compliant and knowledgeable with initial HEP for improved comfort and carryover Baseline: initial HEP given Goal status: ONGOING  2.  Pt will self report right shoulder pain no greater than 7/10 for improved comfort and functional ability Baseline: 9/10 at worst Goal status: ONGOING   LONG TERM GOALS: Target date: 02/26/2024  Pt will decrease Quick DASH disability score to no greater than 40% as proxy for functional improvement Baseline: 55% disability  Goal status: INITIAL  2.  Pt will self report R shoulder pain no greater than 3/10 for improved comfort and functional ability Baseline: 9/10 at worst Goal status: INITIAL   3.  Pt will improve ability to lift 30# KB with no increase in R shoulder pain to simulate improved function with lifting his young children Baseline: unable Goal status: INITIAL  4.  Pt will improve R shoulder flexion to no less than 140 degrees for improved functional ability with OH reaching and home ADLs Baseline:  Goal status: INITIAL  5.  Pt will improve R shoulder MMT to no less than 4/5 for all tested motions for improved comfort and dynamic stability Baseline: see MMT chart Goal status: INITIAL  PLAN:  PT FREQUENCY: 1-2x/week  PT DURATION: 8  weeks  PLANNED INTERVENTIONS: 97164- PT Re-evaluation, 97110-Therapeutic exercises, 97530- Therapeutic activity, [id number]- Neuromuscular re-education, 97535- Self Care, 02859- Manual therapy, G0283- Electrical stimulation (unattended), [id number]- Electrical stimulation (manual), 97016- Vasopneumatic device, Dry Needling, Cryotherapy, and Moist heat  PLAN FOR NEXT SESSION: assess HEP response, RTC and periscapular strengthening  For all possible CPT codes, reference the Planned Interventions line above.     Check all conditions that are expected to impact treatment: {Conditions expected to impact treatment:Musculoskeletal disorders   If treatment provided at initial evaluation, no treatment charged due to lack of authorization.       Harlene Persons, PTA 01/29/24 12:08 PM Phone: [phone] Fax: [phone]

## 2024-01-30 ENCOUNTER — Ambulatory Visit: Payer: MEDICAID | Admitting: Internal Medicine

## 2024-01-30 ENCOUNTER — Encounter: Payer: Self-pay | Admitting: Internal Medicine

## 2024-01-30 VITALS — BP 119/68 | HR 85 | Temp 98.4°F | Ht 77.0 in | Wt 280.8 lb

## 2024-01-30 DIAGNOSIS — N289 Disorder of kidney and ureter, unspecified: Secondary | ICD-10-CM | POA: Diagnosis not present

## 2024-01-30 DIAGNOSIS — E875 Hyperkalemia: Secondary | ICD-10-CM

## 2024-01-30 DIAGNOSIS — M25511 Pain in right shoulder: Secondary | ICD-10-CM

## 2024-01-30 NOTE — Progress Notes (Signed)
 Subjective:  CC: hyperkalemia  HPI:  [name] is a 48 y.o. male with a past medical history stated below and presents today for above. Please see problem based assessment and plan for additional details.  Past Medical History:  Diagnosis Date   Anxiety disorder    At risk for obstructive sleep apnea 04/15/2018   Bipolar 1 disorder (HCC)    COPD (chronic obstructive pulmonary disease) (HCC)    Depression    Hepatitis B immune 04/15/2018   HLD (hyperlipidemia)    Hypertension    Schizophrenia (HCC)     Current Outpatient Medications on File Prior to Visit  Medication Sig Dispense Refill   acetaminophen  (TYLENOL ) 500 MG tablet Take 2 tablets (1,000 mg total) by mouth every 8 (eight) hours as needed for moderate pain (pain score 4-6). DO NOT exceed more than 4,000 mg daily 90 tablet 2   albuterol  (VENTOLIN  HFA) 108 (90 Base) MCG/ACT inhaler Inhale 2 puffs into the lungs every 6 (six) hours as needed for wheezing or shortness of breath. 8 g 4   atorvastatin  (LIPITOR) 40 MG tablet TAKE 1 TABLET (40 MG TOTAL) BY MOUTH DAILY. (BEDTIME) 90 tablet 1   blood glucose meter kit and supplies Dispense based on patient and insurance preference. Use up to four times daily as directed. (FOR ICD-10 E10.9, E11.9). 1 each 0   dicyclomine  (BENTYL ) 20 MG tablet TAKE 1 TABLET BY MOUTH EVERY 6 (SIX) HOURS. (AM+NOON+PM+BEDTIME) 120 tablet 2   famotidine  (PEPCID ) 20 MG tablet TAKE 1 TABLET BY MOUTH 2 (TWO) TIMES DAILY.(AM+PM) 60 tablet 2   fluconazole  (DIFLUCAN ) 200 MG tablet take 2 tablets by mouth on day 1, followed by 1 tablet by mouth once daily for 13 days 15 tablet 0   gabapentin  (NEURONTIN ) 300 MG capsule Take 1 capsule (300 mg total) by mouth 3 (three) times daily. 90 capsule 10   gabapentin  (NEURONTIN ) 600 MG tablet Take 1 tablet (600 mg total) by mouth 3 (three) times daily as needed. 30 tablet 5   hydrOXYzine (ATARAX/VISTARIL) 25 MG tablet Take 25 mg by mouth 2 (two) times daily as  needed.     lamoTRIgine (LAMICTAL) 100 MG tablet Take 100 mg by mouth daily.     metFORMIN  (GLUCOPHAGE ) 1000 MG tablet Take 1 tablet (1,000 mg total) by mouth 2 (two) times daily with a meal. 60 tablet 10   metoprolol  succinate (TOPROL  XL) 25 MG 24 hr tablet Take 1 tablet (25 mg total) by mouth daily. 30 tablet 0   mirtazapine  (REMERON ) 15 MG tablet Take 1 tablet (15 mg total) by mouth at bedtime. 30 tablet 0   nitroGLYCERIN  (NITROSTAT ) 0.4 MG SL tablet Place 1 tablet (0.4 mg total) under the tongue every 5 (five) minutes as needed for chest pain. 100 tablet 3   OZEMPIC , 2 MG/DOSE, 8 MG/3ML SOPN Inject 2 mg into the skin once a week.     pantoprazole  (PROTONIX ) 40 MG tablet TAKE 1 TABLET BY MOUTH 2 (TWO) TIMES DAILY (AM+PM) 90 tablet 3   polyethylene glycol (MIRALAX ) 17 g packet Take 17 g by mouth daily. 30 packet 3   prazosin  (MINIPRESS ) 1 MG capsule Take 1 capsule (1 mg total) by mouth at bedtime. 30 capsule 0   risperiDONE (RISPERDAL) 0.5 MG tablet Take 0.5 mg by mouth at bedtime.     senna (SENOKOT) 8.6 MG TABS tablet Take 1 tablet (8.6 mg total) by mouth daily as needed for mild constipation. 30 tablet 3  sertraline  (ZOLOFT ) 100 MG tablet Take 150 mg by mouth daily.     Current Facility-Administered Medications on File Prior to Visit  Medication Dose Route Frequency Provider Last Rate Last Admin   0.9 %  sodium chloride  infusion  500 mL Intravenous Once Elois Hair, MD       Review of Systems: ROS negative except for as is noted on the assessment and plan.  Objective:   Vitals:   01/30/24 1458  BP: 119/68  Pulse: 85  Temp: 98.4 F (36.9 C)  TempSrc: Oral  SpO2: 99%  Weight: 280 lb 12.8 oz (127.4 kg)  Height: 6' 5 (1.956 m)   Physical Exam: Constitutional: well-appearing, in no acute distress Cardiovascular: regular rate and rhythm Pulmonary/Chest: normal work of breathing on room air MSK: normal bulk and tone  Assessment & Plan:   Hyperkalemia Patient  presents for follow up of hyperkalemia and increased creatinine. When last checked on 5/20, potassium was 5.4. Patient was instructed to discontinue NSAIDs that he was taking for shoulder pain, which he has. He was also instructed to discontinue his lisinopril , but his medications come in pill packs and he didn't know which pill to stop taking.  I instructed the patient that pill packs come with a pill identifier, and he can use this to take out the lisinopril . If that is still confusion, his pharmacist can remove the lisinopril .  - Repeat BMP today. Will call patient with results and advise on when to come in next.   Renal insufficiency Renal ultrasound was normal with no evidence of hydronephrosis, patient updated on results.  - Recheck BMP today  Shoulder pain, right Shoulder pain has been somewhat improving as patient has been attending physical therapy. As instructed, he has not been taking NSAIDs. He has not required any medications for shoulder pain. Recommend continued Tylenol  as needed    Patient discussed with Dr. Frankie Israel MD [facility]  PGY-1 Pager: [phone] Date 01/30/2024  Time 4:17 PM

## 2024-01-30 NOTE — Assessment & Plan Note (Signed)
 Renal ultrasound was normal with no evidence of hydronephrosis, patient updated on results.  - Recheck BMP today

## 2024-01-30 NOTE — Assessment & Plan Note (Signed)
 Shoulder pain has been somewhat improving as patient has been attending physical therapy. As instructed, he has not been taking NSAIDs. He has not required any medications for shoulder pain. Recommend continued Tylenol  as needed

## 2024-01-30 NOTE — Assessment & Plan Note (Signed)
 Patient presents for follow up of hyperkalemia and increased creatinine. When last checked on 5/20, potassium was 5.4. Patient was instructed to discontinue NSAIDs that he was taking for shoulder pain, which he has. He was also instructed to discontinue his lisinopril , but his medications come in pill packs and he didn't know which pill to stop taking.  I instructed the patient that pill packs come with a pill identifier, and he can use this to take out the lisinopril . If that is still confusion, his pharmacist can remove the lisinopril .  - Repeat BMP today. Will call patient with results and advise on when to come in next.

## 2024-01-30 NOTE — Patient Instructions (Signed)
 Bruce [name],   I am checking some bloodwork again for your potassium level and kidney function. I will call you with those results.   In the mean time, please do not take any NSAIDs, and stop taking your lisinopril . There will be a pill identifier in your bubble packs that show which medication this is. If this is still a problem, you can speak with your pharmacist who will take them out of the pack for you.   Thanks,  Dr Esaw Heckler

## 2024-01-31 ENCOUNTER — Encounter: Payer: Self-pay | Admitting: Student

## 2024-01-31 LAB — BMP8+ANION GAP
Anion Gap: 16 mmol/L (ref 10.0–18.0)
BUN/Creatinine Ratio: 9 (ref 9–20)
BUN: 14 mg/dL (ref 6–24)
CO2: 20 mmol/L (ref 20–29)
Calcium: 9.7 mg/dL (ref 8.7–10.2)
Chloride: 103 mmol/L (ref 96–106)
Creatinine, Ser: 1.54 mg/dL — ABNORMAL HIGH (ref 0.76–1.27)
Glucose: 85 mg/dL (ref 70–99)
Potassium: 4.9 mmol/L (ref 3.5–5.2)
Sodium: 139 mmol/L (ref 134–144)
eGFR: 56 mL/min/{1.73_m2} — ABNORMAL LOW (ref >59)

## 2024-02-03 ENCOUNTER — Ambulatory Visit: Payer: Self-pay | Admitting: Internal Medicine

## 2024-02-06 ENCOUNTER — Encounter: Payer: Self-pay | Admitting: Genetic Counselor

## 2024-02-06 ENCOUNTER — Inpatient Hospital Stay: Payer: MEDICAID | Attending: Internal Medicine | Admitting: Genetic Counselor

## 2024-02-06 ENCOUNTER — Inpatient Hospital Stay: Payer: MEDICAID

## 2024-02-06 DIAGNOSIS — D126 Benign neoplasm of colon, unspecified: Secondary | ICD-10-CM | POA: Diagnosis present

## 2024-02-06 DIAGNOSIS — Z8042 Family history of malignant neoplasm of prostate: Secondary | ICD-10-CM | POA: Insufficient documentation

## 2024-02-06 DIAGNOSIS — K635 Polyp of colon: Secondary | ICD-10-CM

## 2024-02-06 LAB — GENETIC SCREENING ORDER

## 2024-02-06 NOTE — Progress Notes (Signed)
 Internal Medicine Clinic Attending  Case discussed with the resident at the time of the visit.  We reviewed the resident's history and exam and pertinent patient test results.  I agree with the assessment, diagnosis, and plan of care documented in the resident's note.

## 2024-02-06 NOTE — Progress Notes (Signed)
 REFERRING PROVIDER: Elois Hair, MD   PRIMARY PROVIDER:  Carleen Chary, DO  PRIMARY REASON FOR VISIT:  1. Polyposis of colon   2. Family history of malignant neoplasm of prostate     HISTORY OF PRESENT ILLNESS:   Bruce [name], a 48 y.o. male, was seen for a [place] cancer genetics consultation at the request of Elois Hair, MD due to a personal history of adenomatous polyps.  Bruce [name] presents to clinic today to discuss the possibility of a hereditary predisposition to cancer, genetic testing, and to further clarify his future cancer risks, as well as potential cancer risks for family members.   Bruce [name] is a 48 y.o. male with no personal history of cancer. Completed first colonoscopy 12/10/23, 46 tubular adenoma identified. EGD 12/10/23 identified one gastric polyp. One year follow up recommended.    Past Medical History:  Diagnosis Date   Anxiety disorder    At risk for obstructive sleep apnea 04/15/2018   Bipolar 1 disorder (HCC)    COPD (chronic obstructive pulmonary disease) (HCC)    Depression    Hepatitis B immune 04/15/2018   HLD (hyperlipidemia)    Hypertension    Schizophrenia (HCC)     Past Surgical History:  Procedure Laterality Date   wisdom tooth removal      Social History   Socioeconomic History   Marital status: Divorced    Spouse name: Not on file   Number of children: 10   Years of education: Not on file   Highest education level: Not on file  Occupational History   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  Tobacco Use   Smoking status: Former    Current packs/day: 0.00    Types: Cigarettes    Quit date: 05/09/2021    Years since quitting: 2.7   Smokeless tobacco: Never   Tobacco comments:    previous marijuana smoker  Vaping Use   Vaping status: Never Used  Substance and Sexual Activity   Alcohol use: Not Currently   Drug use: Not Currently    Types: Marijuana   Sexual activity: Yes    Partners: Female   Other Topics Concern   Not on file  Social History Narrative   Not on file   Social Drivers of Health   Financial Resource Strain: Not on file  Food Insecurity: No Food Insecurity (09/28/2022)   Hunger Vital Sign    Worried About Running Out of Food in the Last Year: Never true    Ran Out of Food in the Last Year: Never true  Transportation Needs: No Transportation Needs (09/28/2022)   PRAPARE - Administrator, Civil Service (Medical): No    Lack of Transportation (Non-Medical): No  Physical Activity: Not on file  Stress: Not on file  Social Connections: Socially Isolated (09/28/2022)   Social Connection and Isolation Panel    Frequency of Communication with Friends and Family: Once a week    Frequency of Social Gatherings with Friends and Family: Never    Attends Religious Services: Never    Database administrator or Organizations: No    Attends Engineer, structural: Never    Marital Status: Separated     FAMILY HISTORY:  We obtained a detailed, 4-generation family history.  Significant diagnoses are listed below: Family History  Problem Relation Age of Onset   Colon polyps Mother        6 polyps   Diabetes Mother  Hypertension Mother    Glaucoma Mother    Prostate cancer Father 57   Glaucoma Father    Diabetes Sister    Migraines Sister    Anemia Sister    Colon polyps Maternal Uncle        48 polyps   Diabetes Maternal Grandmother    Heart Problems Daughter    Cancer - Other Maternal Cousin 76 - 30       oral   Cancer Maternal Cousin 103 - 51       unknown   Colon cancer Neg Hx    Stomach cancer Neg Hx    Esophageal cancer Neg Hx    Pancreatic cancer Neg Hx     Bruce [name] is unaware of previous family history of genetic testing for hereditary cancer risks. There is no reported Ashkenazi Jewish ancestry.   Bruce [name] reports his father was diagnosed with prostate cancer at age 72, living at 72. He has not had a colonoscopy previously. He  reports his mother has had 6 polyps identified by colonoscopy and his maternal half sister has had 3 polyps. He reports a maternal uncle with 4 colonic polyps on one colonoscopy. He reports a maternal first cousin diagnosed with an oral cancer in her 59s, deceased. He reports a maternal first cousin diagnosed with an unknown cancer in her 32s, deceased. He reports that he has 10 children, ages ranging from 3 to 86. His 43 year old daughter has a history of seizures, but his children are otherwise healthy. None of his children have had colonoscopy.     GENETIC COUNSELING ASSESSMENT: Bruce [name] is a 48 y.o. male with a personal history of 43 adenomatous colon polyps which is suggestive of an inherited predisposition to polyposis and cancer. We, therefore, discussed and recommended the following at today's visit.   DISCUSSION: We discussed that, in general, most cases of polyposis and/or cancer are not inherited in families, but instead is sporadic or familial. Sporadic cancers occur by chance and typically happen at older ages (>50 years) as this type of cancer is caused by genetic changes acquired during an individual's lifetime. Some families have more cancers than would be expected by chance; however, the ages or types of cancer are not consistent with a known genetic mutation or known genetic mutations have been ruled out. This type of familial cancer is thought to be due to a combination of multiple genetic, environmental, hormonal, and lifestyle factors. While this combination of factors likely increases the risk of cancer, the exact source of this risk is not currently identifiable or testable.  We discussed that 10% of polyposis is hereditary, with most cases associated with APC pathogenic variants or Familial Adenomatous Polyposis (FAP).  There are other genes that can be associated with hereditary polyposis syndromes  We discussed that testing is beneficial for several reasons including knowing how  to often to screen individuals for colon polyps and cancer, to identify preventative treatments, and to understand if other family members could be at risk for cancer and allow them to undergo genetic testing.   We reviewed the characteristics, features and inheritance patterns of hereditary cancer syndromes. We also discussed genetic testing, including the appropriate family members to test, the process of testing, insurance coverage and turn-around-time for results. We discussed the implications of a negative, positive, carrier and/or variant of uncertain significant result. Bruce [name]  was offered a common hereditary cancer panel (36+ genes) and an expanded pan-cancer panel (70+ genes). Bruce [name]  was informed of the benefits and limitations of each panel, including that expanded pan-cancer panels contain genes that do not have clear management guidelines at this point in time.  We also discussed that as the number of genes included on a panel increases, the chances of variants of uncertain significance increases. Bruce [name] decided to pursue genetic testing for the 41 gene panel. The [facility] CustomNext-Cancer +RNAinsight Panel includes sequencing, rearrangement analysis, and RNA analysis for the following 41 genes: APC, ATM, BAP1, BARD1, BMPR1A, BRCA1, BRCA2, BRIP1, CDH1, CDKN2A, CHEK2, FH, FLCN, MET, MLH1, MSH2, MSH6, MUTYH, NF1, NTHL1, PALB2, PMS2, PTEN, RAD51C, RAD51D, RPS20, SMAD4, STK11, TP53, TSC1, TSC2 and VHL (sequencing and deletion/duplication); AXIN2, HOXB13, MBD4, MSH3, POLD1 and POLE (sequencing only); EPCAM and GREM1 (deletion/duplication only). Preliminary evidence gene MLH3 will also be included on testing.    Based on Bruce [name]'s personal history of more than 10 adenomatous polyps, he meets medical criteria for genetic testing. Despite that he meets criteria, he may still have an out of pocket cost. We discussed that if his out of pocket cost for testing is over $100, the laboratory will call  and confirm whether he wants to proceed with testing.  If the out of pocket cost of testing is less than $100 he will be billed by the genetic testing laboratory.   We discussed that some people do not want to undergo genetic testing due to fear of genetic discrimination.  The Genetic Information Nondiscrimination Act (GINA) was signed into federal law in 2008. GINA prohibits health insurers and most employers from discriminating against individuals based on genetic information (including the results of genetic tests and family history information). According to GINA, health insurance companies cannot consider genetic information to be a preexisting condition, nor can they use it to make decisions regarding coverage or rates. GINA also makes it illegal for most employers to use genetic information in making decisions about hiring, firing, promotion, or terms of employment. It is important to note that GINA does not offer protections for life insurance, disability insurance, or long-term care insurance. GINA does not apply to those in the [employer], those who work for companies with less than 15 employees, and new life insurance or long-term disability insurance policies.  Health status due to a cancer diagnosis is not protected under GINA. More information about GINA can be found by visiting [email or web address].  PLAN: After considering the risks, benefits, and limitations, Bruce [name] provided informed consent to pursue genetic testing and the blood sample was sent to [facility] for analysis of the CustomNext-Cancer +RNAinsight panel. Results should be available within approximately 2-3 weeks' time, at which point they will be disclosed by telephone to Bruce [name], as will any additional recommendations warranted by these results. Bruce [name] will receive a summary of his genetic counseling visit and a copy of his results once available. This information will also be available in Epic.   Lastly, we  encouraged Bruce [name] to remain in contact with cancer genetics annually so that we can continuously update the family history and inform him of any changes in cancer genetics and testing that may be of benefit for this family.   Bruce [name] questions were answered to his satisfaction today. Our contact information was provided should additional questions or concerns arise. Thank you for the referral and allowing us  to share in the care of your patient.   Jobie Mulders, MS, [facility] Licensed, Retail [email or web address] phone: [phone]  55 minutes were spent on  the date of the encounter in service to the patient including preparation, face-to-face consultation, documentation and care coordination. The patient was seen alone. Drs. Johnna Nakai, and/or Gudena were available for questions, if needed..    _______________________________________________________________________ For Office Staff:  Number of people involved in session: 1 Was an Intern/ student involved with case: no

## 2024-02-07 ENCOUNTER — Ambulatory Visit: Payer: Self-pay | Admitting: Student

## 2024-02-07 IMAGING — CT CT ABD-PELV W/ CM
2 of 5 series · 16 of 46 positions shown, 18 images · IV contrast (agent unspecified)
Comparison: 04/29/2021, 05/18/2021

CLINICAL DATA: Acute on chronic abdominal pain, vomiting

EXAM:
CT ABDOMEN AND PELVIS WITH CONTRAST
TECHNIQUE: Multidetector CT imaging of the abdomen and pelvis was performed
using the standard protocol following bolus administration of
intravenous contrast.

[Series 4: abdomen 5.0 · axial · 0.84mm/px · z∈[-1642,-1152]mm · 13 of 114 slices shown, 15 images]
[im 8/114  soft-tissue]
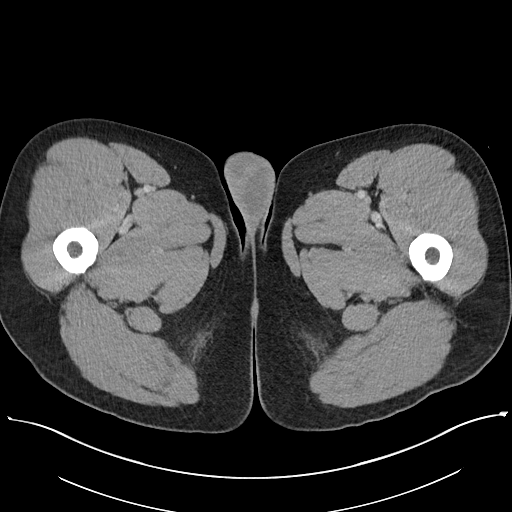
[im 8/114  bone]
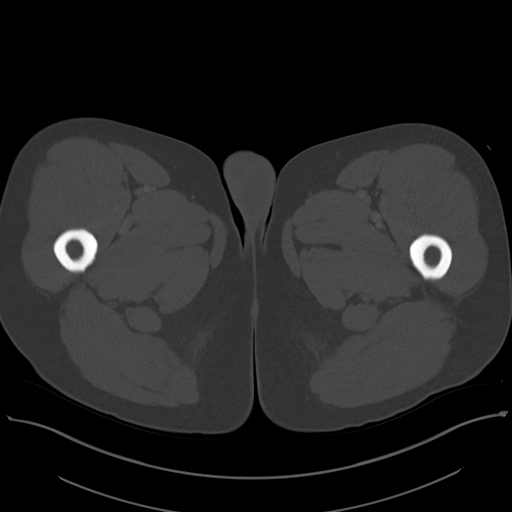
[im 15/114  soft-tissue]
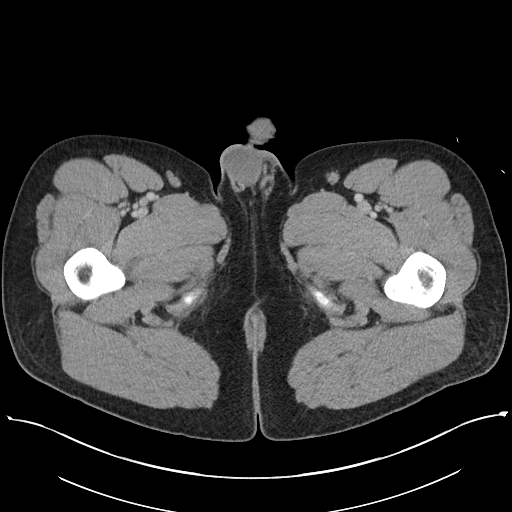
[im 22/114  soft-tissue]
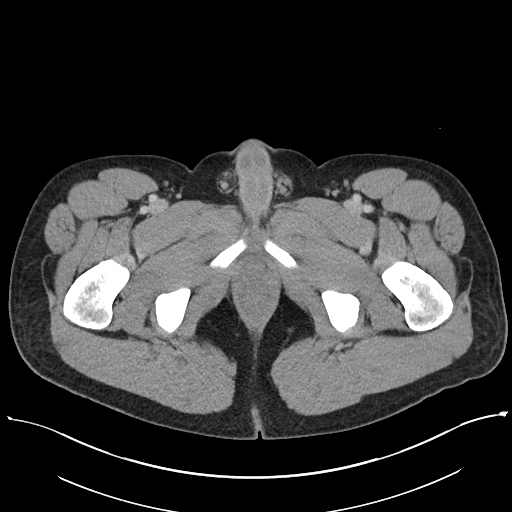
[im 36/114  soft-tissue]
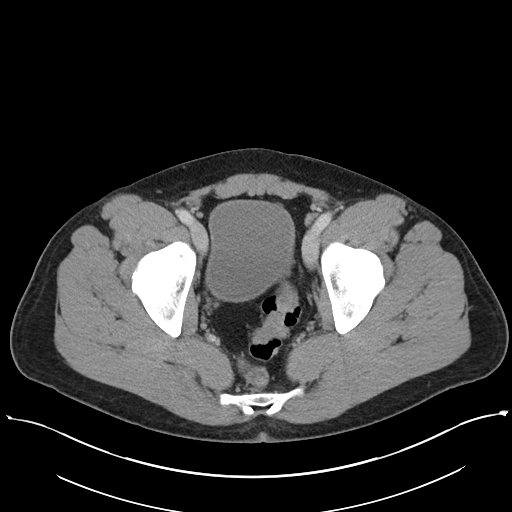
[im 43/114  soft-tissue]
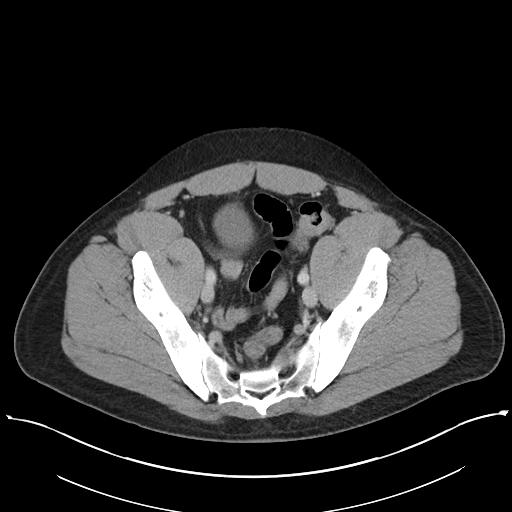
[im 50/114  soft-tissue]
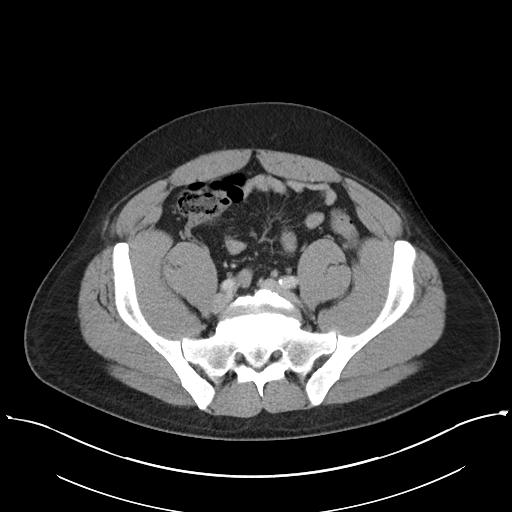
[im 57/114  soft-tissue]
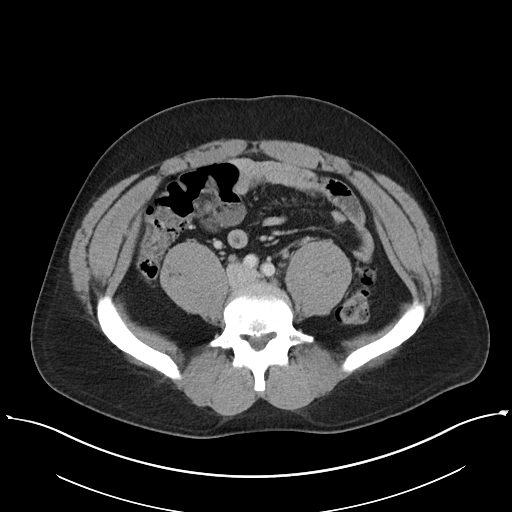
[im 64/114  soft-tissue]
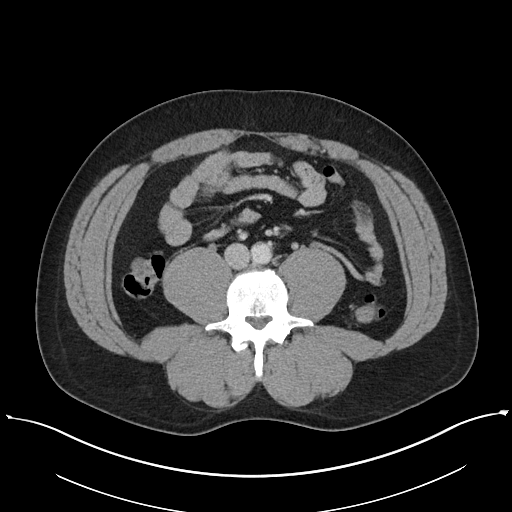
[im 71/114  soft-tissue]
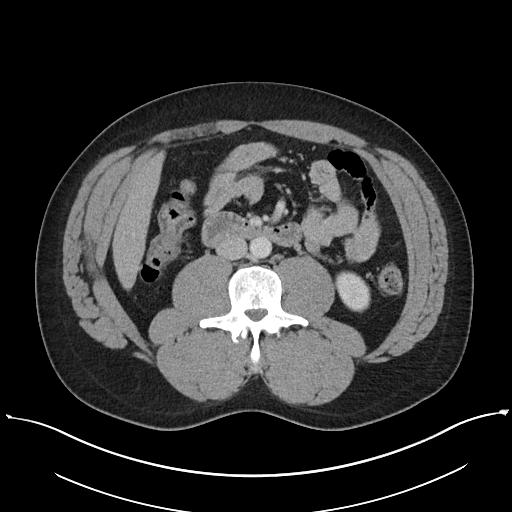
[im 71/114  bone]
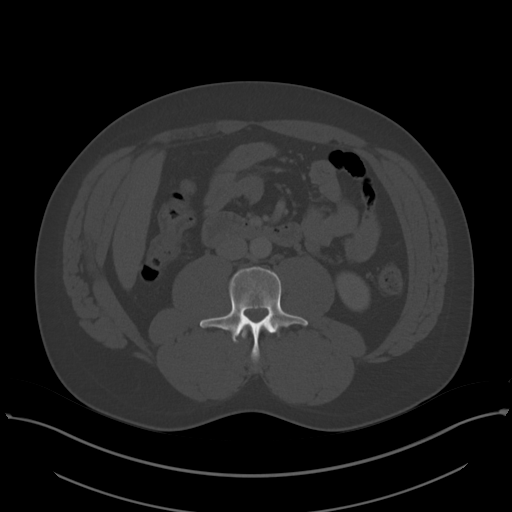
[im 78/114  soft-tissue]
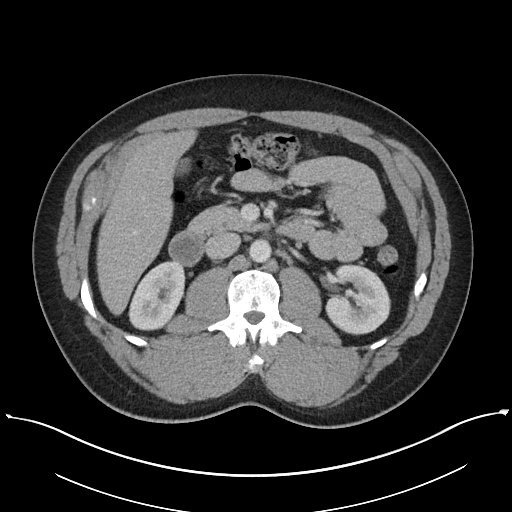
[im 92/114  soft-tissue]
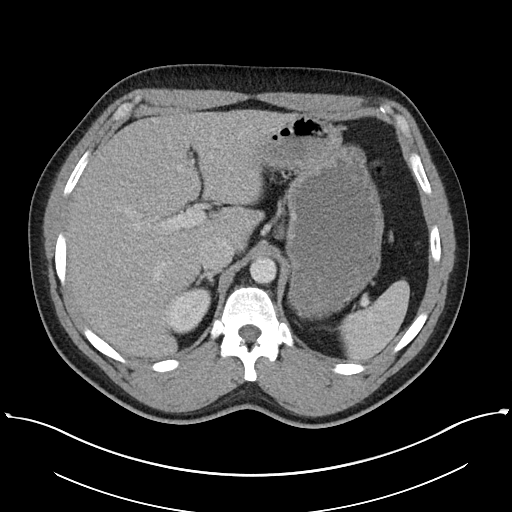
[im 99/114  soft-tissue]
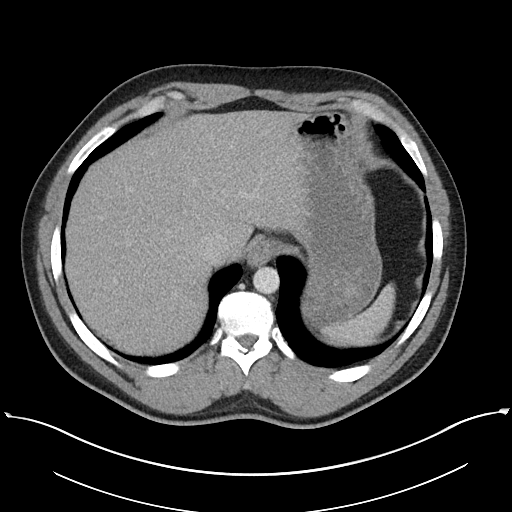
[im 106/114  soft-tissue]
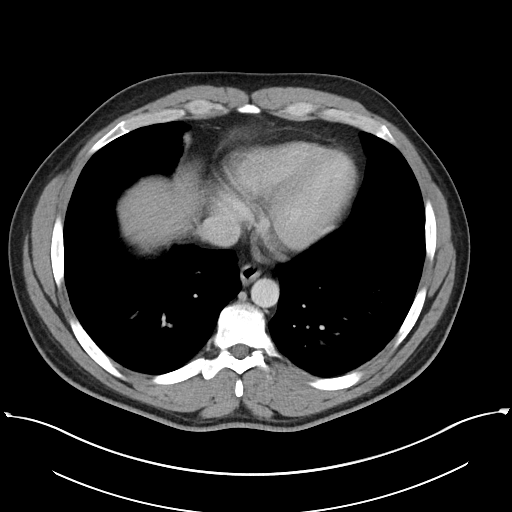

[Series 6: abdomen 3.0 mpr cor · coronal · 0.87mm/px · 3 of 107 slices shown]
[im 36/107  soft-tissue]
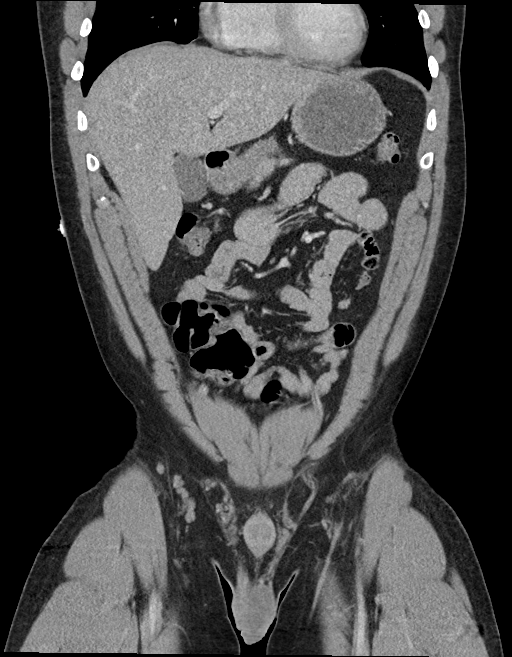
[im 48/107  soft-tissue]
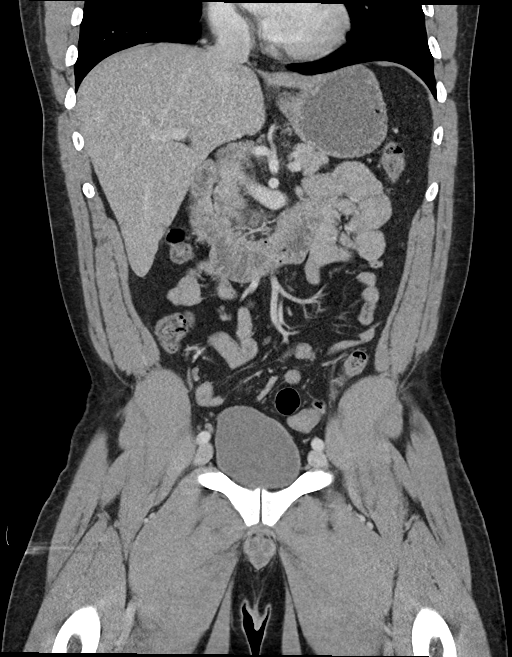
[im 59/107  soft-tissue]
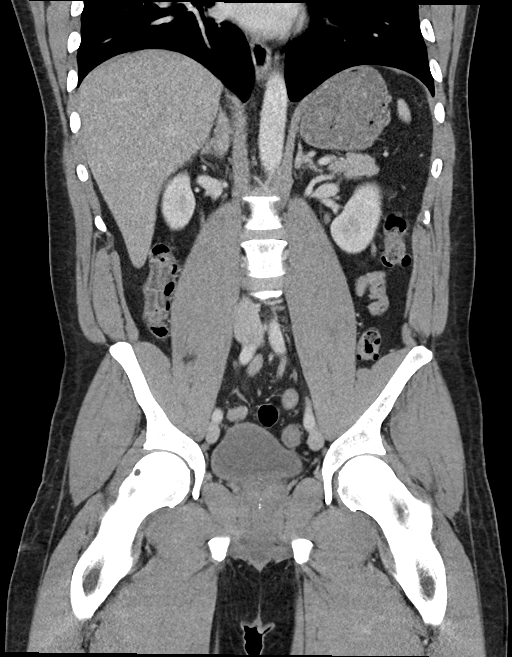

[16 of 46 positions shown; findings below may reference images not displayed]

RADIATION DOSE REDUCTION: This exam was performed according to the
departmental dose-optimization program which includes automated
exposure control, adjustment of the mA and/or kV according to
patient size and/or use of iterative reconstruction technique.

CONTRAST:  100mL OMNIPAQUE IOHEXOL 300 MG/ML  SOLN
FINDINGS: Lower chest: No acute pleural or parenchymal lung disease. Stable
areas of subpleural scarring are again noted. Small cystic area
within the left lower lobe measuring 6 mm on image [DATE] likely post
infectious.

Hepatobiliary: No focal liver abnormality is seen. No gallstones,
gallbladder wall thickening, or biliary dilatation.

Pancreas: Unremarkable. No pancreatic ductal dilatation or
surrounding inflammatory changes.

Spleen: Normal in size without focal abnormality.

Adrenals/Urinary Tract: Adrenal glands are unremarkable. Kidneys are
normal, without renal calculi, focal lesion, or hydronephrosis.
Bladder is unremarkable.

Stomach/Bowel: No bowel obstruction or ileus. Normal appendix right
lower quadrant. Scattered diverticulosis of the distal colon without
evidence of acute diverticulitis. No bowel wall thickening or
inflammatory change.

Vascular/Lymphatic: No significant vascular findings are present. No
enlarged abdominal or pelvic lymph nodes.

Reproductive: Prostate is unremarkable.

Other: No free fluid or free gas.  No abdominal wall hernia.

Musculoskeletal: No acute or destructive bony lesions. Reconstructed
images demonstrate no additional findings.
IMPRESSION: 1. No acute intra-abdominal or intrapelvic process.
2. Distal colonic diverticulosis without diverticulitis.

## 2024-02-11 ENCOUNTER — Ambulatory Visit: Payer: MEDICAID | Admitting: Podiatry

## 2024-02-17 ENCOUNTER — Ambulatory Visit: Payer: Self-pay

## 2024-02-17 NOTE — Telephone Encounter (Signed)
 Copied from CRM [id number]. Topic: Clinical - Red Word Triage >> Feb 17, 2024  5:18 PM Mercer PEDLAR wrote: Red Word that prompted transfer to Nurse Triage: Blood in urine, severe pain.    FYI Only or Action Required?: FYI only for provider.  Patient was last seen in primary care on 01/30/2024 by Francella Rogue, MD. Called Nurse Triage reporting Groin Pain. Symptoms began today. Interventions attempted: Rest, hydration, or home remedies. Symptoms are: unchanged.  Triage Disposition: Go to ED Now (Notify PCP)  Patient/caregiver understands and will follow disposition?: Yes    Reason for Disposition  [1] Constant pain in scrotum or testicle AND [2] present > 1 hour  Answer Assessment - Initial Assessment Questions 1. LOCATION and RADIATION: Where is the pain located?      Left sided scrotum/testicle  2. QUALITY: What does the pain feel like?  (e.g., sharp, dull, aching, burning)     It's just really bad 3. SEVERITY: How bad is the pain?  (Scale 1-10; or mild, moderate, severe)   - MILD (1-3): doesn't interfere with normal activities    - MODERATE (4-7): interferes with normal activities (e.g., work or school) or awakens from sleep   - SEVERE (8-10): excruciating pain, unable to do any normal activities, difficulty walking     Moderate to Severe   4. ONSET: When did the pain start?     1 hour ago  5. PATTERN: Does it come and go, or has it been constant since it started?     Constant  6. SCROTAL APPEARANCE: What does the scrotum look like? Is there any swelling or redness?      Has not checked  8. OTHER SYMPTOMS: Do you have any other symptoms? (e.g., fever, abdominal pain, vomiting, difficulty passing urine)     Hematuria  Protocols used: Scrotal Pain-A-AH

## 2024-02-20 ENCOUNTER — Ambulatory Visit: Payer: Self-pay

## 2024-02-20 ENCOUNTER — Other Ambulatory Visit: Payer: Self-pay

## 2024-02-20 ENCOUNTER — Ambulatory Visit: Payer: MEDICAID

## 2024-02-20 VITALS — BP 118/73 | HR 67 | Temp 98.4°F | Ht 77.0 in | Wt 271.0 lb

## 2024-02-20 DIAGNOSIS — N1831 Chronic kidney disease, stage 3a: Secondary | ICD-10-CM

## 2024-02-20 DIAGNOSIS — E1121 Type 2 diabetes mellitus with diabetic nephropathy: Secondary | ICD-10-CM | POA: Diagnosis not present

## 2024-02-20 DIAGNOSIS — E0821 Diabetes mellitus due to underlying condition with diabetic nephropathy: Secondary | ICD-10-CM

## 2024-02-20 DIAGNOSIS — Z7985 Long-term (current) use of injectable non-insulin antidiabetic drugs: Secondary | ICD-10-CM

## 2024-02-20 DIAGNOSIS — N5089 Other specified disorders of the male genital organs: Secondary | ICD-10-CM | POA: Diagnosis not present

## 2024-02-20 DIAGNOSIS — G8929 Other chronic pain: Secondary | ICD-10-CM

## 2024-02-20 DIAGNOSIS — E1122 Type 2 diabetes mellitus with diabetic chronic kidney disease: Secondary | ICD-10-CM | POA: Diagnosis not present

## 2024-02-20 DIAGNOSIS — M25511 Pain in right shoulder: Secondary | ICD-10-CM

## 2024-02-20 LAB — POCT GLYCOSYLATED HEMOGLOBIN (HGB A1C): Hemoglobin A1C: 5.8 % — AB (ref 4.0–5.6)

## 2024-02-20 LAB — GLUCOSE, CAPILLARY: Glucose-Capillary: 92 mg/dL (ref 70–99)

## 2024-02-20 NOTE — Progress Notes (Signed)
 Bruce [name], your blood sugar and HbA1c (which reflects your average blood sugar over the past three months) results came back within the normal range. Your diabetes is well controlled.

## 2024-02-20 NOTE — Assessment & Plan Note (Signed)
 The patient expressed interest in switching from Ozempic  to a higher dose of Mounjaro to support weight loss. He reports feeling overweight and experiencing fatigue while walking, though he has had no side effects from Ozempic .   Mentioning his last HbA1c was 6.1 and his stage 3 CKD, we discussed the benefits of Ozempic  over Mounjaro and decided to continue with Ozempic . We will check HbA1c again.

## 2024-02-20 NOTE — Progress Notes (Signed)
 Patient name: Bruce [name] Date of birth: 1975-12-23 Date of visit: 02/20/24  Type of visit: Established Patient Office Visit   Subjective   Chief concern:  Chief Complaint  Patient presents with   discuss about medication    Testicle Pain    Pt states the left side is hurting and swollen.    Shoulder Pain    Pain with movement.    Bruce [name] is a 48 y.o. male with a PMHx of type 2 Diabetes, Hypertension who presents to [facility] clinic for evaluation of right shoulder pain as well as scrotal swelling and pain.   Patient Active Problem List   Diagnosis Date Noted   Scrotal swelling 02/20/2024   Hyperkalemia 12/27/2023   Candidal esophagitis (HCC) 12/27/2023   Personal history of hyperplastic colon polyps 12/26/2023   CKD (chronic kidney disease) stage 3, GFR 30-59 ml/min (HCC) 12/17/2023   Radiculopathy 09/28/2023   Morbid obesity (HCC) 05/22/2023   Snoring 05/02/2023   OSA (obstructive sleep apnea) 03/18/2023   Sciatica 03/13/2023   PTSD (post-traumatic stress disorder) 03/12/2023   Stable angina (HCC) 12/09/2022   Chronic lower back pain 12/07/2022   Pulmonary nodule 09/28/2022   Shoulder pain, right 09/14/2022   Diabetes mellitus due to underlying condition, controlled, with diabetic nephropathy (HCC) 09/14/2022   Healthcare maintenance 08/31/2022   COPD (chronic obstructive pulmonary disease) (HCC) 08/30/2022   Right leg pain 10/20/2021   IBS (irritable bowel syndrome) 05/04/2021   Unexplained night sweats 10/13/2019   Chronic headaches 10/13/2019   Dizziness 10/13/2019   Earlobe lesion, left 07/15/2019   HLD (hyperlipidemia) 03/12/2019   Abscess of apex of dental root complicating chronic inflammation 03/12/2019   Finger pain, left 01/20/2019   HTN (hypertension) 11/12/2018   Tobacco use disorder 05/20/2018   Schizophrenia (HCC) 04/15/2018   Bipolar disorder (HCC) 04/15/2018   Anxiety and depression 04/15/2018   Gastroesophageal reflux disease 04/15/2018    At risk for obstructive sleep apnea 04/15/2018     Past Surgical History:  Procedure Laterality Date   wisdom tooth removal      ROS  Current Outpatient Medications  Medication Instructions   acetaminophen  (TYLENOL ) 1,000 mg, Oral, Every 8 hours PRN, DO NOT exceed more than 4,000 mg daily   albuterol  (VENTOLIN  HFA) 108 (90 Base) MCG/ACT inhaler 2 puffs, Inhalation, Every 6 hours PRN   atorvastatin  (LIPITOR) 40 MG tablet TAKE 1 TABLET (40 MG TOTAL) BY MOUTH DAILY. (BEDTIME)   blood glucose meter kit and supplies Dispense based on patient and insurance preference. Use up to four times daily as directed. (FOR ICD-10 E10.9, E11.9).   dicyclomine  (BENTYL ) 20 MG tablet TAKE 1 TABLET BY MOUTH EVERY 6 (SIX) HOURS. (AM+NOON+PM+BEDTIME)   famotidine  (PEPCID ) 20 MG tablet TAKE 1 TABLET BY MOUTH 2 (TWO) TIMES DAILY.(AM+PM)   fluconazole  (DIFLUCAN ) 200 MG tablet take 2 tablets by mouth on day 1, followed by 1 tablet by mouth once daily for 13 days   gabapentin  (NEURONTIN ) 300 mg, Oral, 3 times daily   gabapentin  (NEURONTIN ) 600 mg, Oral, 3 times daily PRN   hydrOXYzine (ATARAX) 25 mg, 2 times daily PRN   lamoTRIgine (LAMICTAL) 100 mg, Daily   metFORMIN  (GLUCOPHAGE ) 1,000 mg, Oral, 2 times daily with meals   metoprolol  succinate (TOPROL  XL) 25 mg, Oral, Daily   mirtazapine  (REMERON ) 15 mg, Oral, Daily at bedtime   nitroGLYCERIN  (NITROSTAT ) 0.4 mg, Sublingual, Every 5 min PRN   Ozempic  (2 MG/DOSE) 2 mg, Weekly   pantoprazole  (PROTONIX ) 40 MG  tablet TAKE 1 TABLET BY MOUTH 2 (TWO) TIMES DAILY (AM+PM)   polyethylene glycol (MIRALAX ) 17 g, Oral, Daily   prazosin  (MINIPRESS ) 1 mg, Oral, Daily at bedtime   risperiDONE (RISPERDAL) 0.5 mg, Daily at bedtime   senna (SENOKOT) 8.6 mg, Oral, Daily PRN   sertraline  (ZOLOFT ) 150 mg, Daily    Social History   Tobacco Use   Smoking status: Former    Current packs/day: 0.00    Types: Cigarettes    Quit date: 05/09/2021    Years since quitting: 2.7    Smokeless tobacco: Never   Tobacco comments:    previous marijuana smoker  Vaping Use   Vaping status: Never Used  Substance Use Topics   Alcohol use: Not Currently   Drug use: Not Currently    Types: Marijuana      Objective  Today's Vitals   02/20/24 1438  BP: 118/73  Pulse: 67  Temp: 98.4 F (36.9 C)  TempSrc: Oral  SpO2: 99%  Weight: 271 lb (122.9 kg)  Height: 6' 5 (1.956 m)  Body mass index is 32.14 kg/m.   Physical Exam  General: Alert, no acute distress.  Cardiac: Regular rate and rhythm, normal S1 and S2, no murmurs, rubs, or gallops. Lungs: Clear to auscultation bilaterally in all fields, with no wheezes or crackles appreciated. Normal respiratory effort. Extremities: right shoulder tenderness and decreased ROM, pain in passive and active backward movement Scrotum: left side swelling and tenderness/ no bruising and ulcer noted      Assessment & Plan  Problem List Items Addressed This Visit       Endocrine   Diabetes mellitus due to underlying condition, controlled, with diabetic nephropathy (HCC)   The patient expressed interest in switching from Ozempic  to a higher dose of Mounjaro to support weight loss. He reports feeling overweight and experiencing fatigue while walking, though he has had no side effects from Ozempic .   Mentioning his last HbA1c was 6.1 and his stage 3 CKD, we discussed the benefits of Ozempic  over Mounjaro and decided to continue with Ozempic . We will check HbA1c again.            Other   Shoulder pain, right   He reports right shoulder pain that began approximately 1.5 years ago after a twisting injury. The pain worsens with movement, especially when moving the arm backward, and he has limited range of motion with tenderness on palpation. He previously underwent physical therapy without relief. This presentation is consistent with rotator cuff tendinopathy or impingement. Plan: Refer to an orthopedic surgeon for possible steroid  injection and further follow-up.        Relevant Orders   Ambulatory referral to Orthopedic Surgery   Scrotal swelling - Primary   He is presenting today for evaluation of left sided scrotal pain and swelling that began two days ago. He describes the sensation as feeling squished, while sitting, resembling a pressure related trauma. He initially noticed some discharge but reports none currently. The area becomes painful with pressure, though he has experienced some relief using ice and heat packs.  He denies redness or systemic symptoms. This presentation is most consistent with skin irritation or a pressure related lesion. On examination, left sided swelling and tenderness were observed without ulcer/bruising  Plan:  Scrotal Ultrasound (stat), Recommend wearing loose fitting clothing, continue conservative measures (ice/heat)      Relevant Orders   US  Scrotum   Other Visit Diagnoses       Type 2  diabetes mellitus with stage 3a chronic kidney disease, without long-term current use of insulin (HCC)       Relevant Orders   POC Hbg A1C (Completed)       Patient discussed with Dr. Narendra, who also saw and evaluated the patient.  Armando Rossetti, MD  IM  PGY-1 02/20/2024, 4:22 PM

## 2024-02-20 NOTE — Assessment & Plan Note (Signed)
 He reports right shoulder pain that began approximately 1.5 years ago after a twisting injury. The pain worsens with movement, especially when moving the arm backward, and he has limited range of motion with tenderness on palpation. He previously underwent physical therapy without relief. This presentation is consistent with rotator cuff tendinopathy or impingement. Plan: Refer to an orthopedic surgeon for possible steroid injection and further follow-up.

## 2024-02-20 NOTE — Addendum Note (Signed)
 Addended by: Jaquan Sadowsky on: 02/20/2024 04:52 PM   Modules accepted: Orders

## 2024-02-20 NOTE — Assessment & Plan Note (Signed)
 He is presenting today for evaluation of left sided scrotal pain and swelling that began two days ago. He describes the sensation as feeling squished, while sitting, resembling a pressure related trauma. He initially noticed some discharge but reports none currently. The area becomes painful with pressure, though he has experienced some relief using ice and heat packs.  He denies redness or systemic symptoms. This presentation is most consistent with skin irritation or a pressure related lesion. On examination, left sided swelling and tenderness were observed without ulcer/bruising  Plan:  Scrotal Ultrasound (stat), Recommend wearing loose fitting clothing, continue conservative measures (ice/heat)

## 2024-02-20 NOTE — Patient Instructions (Addendum)
  Thank you, Mr. Jacobs Golab, for allowing us  to provide your care today. We discussed your right shoulder pain and have sent a referral to an orthopedic surgeon for possible injection. We also talked about continuing Ozempic , as it may be more beneficial for your kidney function than switching to Mounjaro. Additionally, we addressed your scrotal pain during today's visit and we send a request for scrotal ultrasound.   I have ordered the following labs for you:  Lab Orders         Glucose, capillary         POC Hbg A1C         Referrals ordered today:   Referral Orders         Ambulatory referral to Orthopedic Surgery     Requested test:        Scrotal Ultrasound  I have ordered the following medication/changed the following medications:   Stop the following medications: There are no discontinued medications.   Start the following medications: No orders of the defined types were placed in this encounter.    Follow up: 2-3 months   Remember:   Should you have any questions or concerns please call the internal medicine clinic at [phone].    Armando Rossetti, M.D [facility]

## 2024-02-21 NOTE — Progress Notes (Signed)
Internal Medicine Clinic Attending  I was physically present during the key portions of the resident provided service and participated in the medical decision making of patient's management care. I reviewed pertinent patient test results.  The assessment, diagnosis, and plan were formulated together and I agree with the documentation in the resident's note.  Narendra, Nischal, MD  

## 2024-02-21 NOTE — Addendum Note (Signed)
 Addended by: Gracyn Santillanes on: 02/21/2024 10:32 AM   Modules accepted: Level of Service

## 2024-02-26 ENCOUNTER — Ambulatory Visit (HOSPITAL_COMMUNITY): Admission: RE | Admit: 2024-02-26 | Payer: MEDICAID | Source: Ambulatory Visit

## 2024-02-26 ENCOUNTER — Encounter: Payer: Self-pay | Admitting: Genetic Counselor

## 2024-02-26 ENCOUNTER — Telehealth: Payer: Self-pay | Admitting: Genetic Counselor

## 2024-02-26 NOTE — Telephone Encounter (Signed)
 Patient requested call back in approximately 15 minutes.

## 2024-02-26 NOTE — Telephone Encounter (Signed)
 I spoke to Bruce [name] to review results of genetic testing. Genetic testing completed with [facility] +RNAinsight panel. 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 Patient given option to discuss risks and recommendations by phone or schedule in person genetic counseling. Patient opted for in person. Scheduled for 02/28/24 at 12:30 pm.   Portion of result included below.

## 2024-02-27 ENCOUNTER — Ambulatory Visit (HOSPITAL_COMMUNITY)
Admission: RE | Admit: 2024-02-27 | Discharge: 2024-02-27 | Disposition: A | Discharge status: Home / Self Care | Payer: MEDICAID | Source: Ambulatory Visit | Attending: Internal Medicine | Admitting: Internal Medicine

## 2024-02-27 DIAGNOSIS — N5089 Other specified disorders of the male genital organs: Secondary | ICD-10-CM | POA: Diagnosis present

## 2024-02-28 ENCOUNTER — Encounter: Payer: Self-pay | Admitting: Genetic Counselor

## 2024-02-28 ENCOUNTER — Inpatient Hospital Stay: Payer: MEDICAID | Attending: Internal Medicine | Admitting: Genetic Counselor

## 2024-02-28 NOTE — Progress Notes (Signed)
 Dear Bruce [name],  Your scrotal ultrasound results have been reviewed. The findings show a small cyst on the left side and a small hydrocele on the right side. These are both benign conditions and typically do not require any treatment, especially when they are small and asymptomatic.  However, if you continue to experience pain, notice any increase in swelling, or develop any new symptoms, please don't hesitate to contact us  for further evaluation.

## 2024-03-02 ENCOUNTER — Telehealth: Payer: Self-pay | Admitting: Genetic Counselor

## 2024-03-02 NOTE — Telephone Encounter (Signed)
 Patient scheduled for genetic counseling 02/28/24, no show. Calling to reschedule or to discuss results by phone per patient preference. 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 LVM for patient encouraging call back.

## 2024-03-04 ENCOUNTER — Other Ambulatory Visit: Payer: Self-pay | Admitting: Student

## 2024-03-04 DIAGNOSIS — I10 Essential (primary) hypertension: Secondary | ICD-10-CM

## 2024-03-04 DIAGNOSIS — E785 Hyperlipidemia, unspecified: Secondary | ICD-10-CM

## 2024-03-10 ENCOUNTER — Other Ambulatory Visit (INDEPENDENT_AMBULATORY_CARE_PROVIDER_SITE_OTHER): Payer: MEDICAID

## 2024-03-10 ENCOUNTER — Encounter: Payer: Self-pay | Admitting: Physician Assistant

## 2024-03-10 ENCOUNTER — Ambulatory Visit (INDEPENDENT_AMBULATORY_CARE_PROVIDER_SITE_OTHER): Payer: MEDICAID | Admitting: Physician Assistant

## 2024-03-10 DIAGNOSIS — M25511 Pain in right shoulder: Secondary | ICD-10-CM | POA: Diagnosis not present

## 2024-03-10 DIAGNOSIS — G8929 Other chronic pain: Secondary | ICD-10-CM | POA: Diagnosis not present

## 2024-03-10 MED ORDER — BUPIVACAINE HCL 0.25 % IJ SOLN
2.0000 mL | INTRAMUSCULAR | Status: AC | PRN
Start: 2024-03-10 — End: 2024-03-10
  Administered 2024-03-10: 2 mL via INTRA_ARTICULAR

## 2024-03-10 MED ORDER — METHYLPREDNISOLONE ACETATE 40 MG/ML IJ SUSP
40.0000 mg | INTRAMUSCULAR | Status: AC | PRN
  Administered 2024-03-10: 40 mg via INTRA_ARTICULAR

## 2024-03-10 MED ORDER — TRAMADOL HCL 50 MG PO TABS: 50.0000 mg | ORAL_TABLET | Freq: Two times a day (BID) | ORAL | 0 refills | Status: DC | PRN

## 2024-03-10 MED ORDER — LIDOCAINE HCL 2 % IJ SOLN
2.0000 mL | INTRAMUSCULAR | Status: AC | PRN
  Administered 2024-03-10: 2 mL

## 2024-03-10 NOTE — Addendum Note (Signed)
 Addended by: JULE RONAL CROME on: 03/10/2024 03:31 PM   Modules accepted: Orders

## 2024-03-10 NOTE — Progress Notes (Signed)
 Office Visit Note   Patient: Bruce [name]           Date of Birth: April 22, 1976           MRN: [id number] Visit Date: 03/10/2024              Requested by: Onesimo Claude, MD [address] [place],  [place] [address] PCP: Harrie Bruckner, DO   Assessment & Plan: Visit Diagnoses:  1. Chronic right shoulder pain     Plan: Impression is chronic right shoulder pain.  We have discussed various treatment options to include a course of NSAIDs versus subacromial cortisone injection.  He would like to try cortisone injection today.  Also provided him with a shoulder exercise program.  He will follow-up with us  as needed.  Call with concerns or questions.  Follow-Up Instructions: Return if symptoms worsen or fail to improve.   Orders:  Orders Placed This Encounter  Procedures   Large Joint Inj   XR Shoulder Right   No orders of the defined types were placed in this encounter.     Procedures: Large Joint Inj: R subacromial bursa on 03/10/2024 3:16 PM Indications: pain Details: 22 G needle Medications: 2 mL lidocaine  2 %; 2 mL bupivacaine  0.25 %; 40 mg methylPREDNISolone  acetate 40 MG/ML Outcome: tolerated well, no immediate complications Patient was prepped and draped in the usual sterile fashion.       Clinical Data: No additional findings.   Subjective: Chief Complaint  Patient presents with   Right Shoulder - Pain    HPI patient is a pleasant 48 year old gentleman who comes in today with right shoulder pain.  Symptoms began about 30 years ago after doing a back flip and twisting his arm a certain way.  He has had progressively worsening symptoms since.  All this pain is to the deltoid and some into the biceps.  Pain is constant but worse with any movement of the right shoulder.  He is not taking anything for the pain.  He has not undergone shoulder injection in the past.  He has been to physical therapy without any relief.  Review of Systems as  detailed in HPI.  All others reviewed and are negative.   Objective: Vital Signs: There were no vitals taken for this visit.  Physical Exam well-developed well-nourished gentleman in no acute distress.  Alert and oriented x 3.  Ortho Exam right shoulder exam: Active forward flexion about 120 degrees.  I can passively get him to about 170 degrees.  Abduction to about 100 degrees.  Internal rotation to L5.  External rotation to 60 degrees.  Does have pain with empty can testing.  He has full strength throughout.  He is neurovascularly intact distally.  Specialty Comments:  No specialty comments available.  Imaging: XR Shoulder Right Result Date: 03/10/2024 X-rays demonstrate moderate AC glenohumeral degenerative changes.  No superior migration of the humeral head.    PMFS History: Patient Active Problem List   Diagnosis Date Noted   Scrotal swelling 02/20/2024   Hyperkalemia 12/27/2023   Candidal esophagitis (HCC) 12/27/2023   Personal history of hyperplastic colon polyps 12/26/2023   CKD (chronic kidney disease) stage 3, GFR 30-59 ml/min (HCC) 12/17/2023   Radiculopathy 09/28/2023   Morbid obesity (HCC) 05/22/2023   Snoring 05/02/2023   OSA (obstructive sleep apnea) 03/18/2023   Sciatica 03/13/2023   PTSD (post-traumatic stress disorder) 03/12/2023   Stable angina (HCC) 12/09/2022   Chronic lower back pain 12/07/2022   Pulmonary  nodule 09/28/2022   Shoulder pain, right 09/14/2022   Diabetes mellitus due to underlying condition, controlled, with diabetic nephropathy (HCC) 09/14/2022   Healthcare maintenance 08/31/2022   COPD (chronic obstructive pulmonary disease) (HCC) 08/30/2022   Right leg pain 10/20/2021   IBS (irritable bowel syndrome) 05/04/2021   Unexplained night sweats 10/13/2019   Chronic headaches 10/13/2019   Dizziness 10/13/2019   Earlobe lesion, left 07/15/2019   HLD (hyperlipidemia) 03/12/2019   Abscess of apex of dental root complicating chronic  inflammation 03/12/2019   Finger pain, left 01/20/2019   HTN (hypertension) 11/12/2018   Tobacco use disorder 05/20/2018   Schizophrenia (HCC) 04/15/2018   Bipolar disorder (HCC) 04/15/2018   Anxiety and depression 04/15/2018   Gastroesophageal reflux disease 04/15/2018   At risk for obstructive sleep apnea 04/15/2018   Past Medical History:  Diagnosis Date   Anxiety disorder    At risk for obstructive sleep apnea 04/15/2018   Bipolar 1 disorder (HCC)    COPD (chronic obstructive pulmonary disease) (HCC)    Depression    Hepatitis B immune 04/15/2018   HLD (hyperlipidemia)    Hypertension    Schizophrenia (HCC)     Family History  Problem Relation Age of Onset   Colon polyps Mother        6 polyps   Diabetes Mother    Hypertension Mother    Glaucoma Mother    Prostate cancer Father 3   Glaucoma Father    Diabetes Sister    Migraines Sister    Anemia Sister    Colon polyps Maternal Uncle        48 polyps   Diabetes Maternal Grandmother    Heart Problems Daughter    Cancer - Other Maternal Cousin 71 - 54       oral   Cancer Maternal Cousin 6 - 49       unknown   Colon cancer Neg Hx    Stomach cancer Neg Hx    Esophageal cancer Neg Hx    Pancreatic cancer Neg Hx     Past Surgical History:  Procedure Laterality Date   wisdom tooth removal     Social History   Occupational History   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  Tobacco Use   Smoking status: Former    Current packs/day: 0.00    Types: Cigarettes    Quit date: 05/09/2021    Years since quitting: 2.8   Smokeless tobacco: Never   Tobacco comments:    previous marijuana smoker  Vaping Use   Vaping status: Never Used  Substance and Sexual Activity   Alcohol use: Not Currently   Drug use: Not Currently    Types: Marijuana   Sexual activity: Yes    Partners: Female

## 2024-03-11 ENCOUNTER — Other Ambulatory Visit: Payer: Self-pay | Admitting: Physician Assistant

## 2024-03-11 ENCOUNTER — Telehealth: Payer: Self-pay | Admitting: Physician Assistant

## 2024-03-11 MED ORDER — TRAMADOL HCL 50 MG PO TABS: 50.0000 mg | ORAL_TABLET | Freq: Two times a day (BID) | ORAL | 0 refills | Status: DC | PRN

## 2024-03-11 NOTE — Telephone Encounter (Signed)
 Pt called stating his insurance wont pay for the medication prescribed from [facility] and asking for Pa to call his insurance or call him about this matter at [phone]

## 2024-03-11 NOTE — Telephone Encounter (Signed)
 Called and LMOM. Insurance is only approving for a 5 day supply. Medication has been resubmitted to pharmacy.

## 2024-03-13 ENCOUNTER — Ambulatory Visit: Payer: MEDICAID

## 2024-03-13 ENCOUNTER — Other Ambulatory Visit: Payer: Self-pay | Admitting: *Deleted

## 2024-03-13 VITALS — BP 128/96 | HR 104 | Temp 98.7°F | Ht 77.0 in | Wt 263.4 lb

## 2024-03-13 DIAGNOSIS — J449 Chronic obstructive pulmonary disease, unspecified: Secondary | ICD-10-CM | POA: Diagnosis not present

## 2024-03-13 MED ORDER — UMECLIDINIUM-VILANTEROL 62.5-25 MCG/ACT IN AEPB: 1.0000 | INHALATION_SPRAY | Freq: Every day | RESPIRATORY_TRACT | 2 refills | Status: DC

## 2024-03-13 NOTE — Patient Instructions (Addendum)
 Thank you, Mr. Hans Rusher, for allowing us  to provide your care today. We discussed your COPD symptoms and are very pleased that you quit smoking, great job, and please continue to keep it up! It's also encouraging that you haven't had any recent exacerbations or acute issues. Your Anoro Ellipta  prescription has been refilled.  It was a pleasure seeing you today. Please don't hesitate to reach out if you have any questions or concerns.  I have ordered the following labs for you:  Lab Orders  No laboratory test(s) ordered today      Referrals ordered today:   Referral Orders  No referral(s) requested today     I have ordered the following medication/changed the following medications:   Stop the following medications: There are no discontinued medications.   Start the following medications: Meds ordered this encounter  Medications   umeclidinium-vilanterol (ANORO ELLIPTA ) 62.5-25 MCG/ACT AEPB    Sig: Inhale 1 puff into the lungs daily.    Dispense:  60 each    Refill:  2     Follow up: 6 months   Remember:   Should you have any questions or concerns please call the internal medicine clinic at [phone].    Armando Rossetti, M.D [facility]

## 2024-03-13 NOTE — Assessment & Plan Note (Signed)
 Patient presented for a refill of Anoro Ellipta  for COPD. Currently using Ventolin  about twice a week. Denies shortness of breath, dyspnea on exertion, increased cough, or wheezing.  Lungs clear to auscultation, afebrile.  Plan: Refill Anoro Ellipta  as requested.

## 2024-03-13 NOTE — Telephone Encounter (Signed)
 Copied from CRM [id number]. Topic: Clinical - Prescription Issue >> Mar 13, 2024 11:44 AM Diannia H wrote: Reason for CRM: Patient called and stated the pharmacy says they can't fill it because the original provider that filled it medicaid is no longer allowing that provider to fill meds and another doctor would have to sign and send. The medicine is umeclidinium-vilanterol (ANORO ELLIPTA ) 62.5-25 MCG/ACT AEPB, could you please assist? Callback number for patient is [phone].

## 2024-03-13 NOTE — Progress Notes (Signed)
 CC: Medication refill (Anoro- Ellipta)  HPI:  [name] is a 48 y.o. male living with a history stated below and presents today for Medication refill (Anoro- Ellipta). Please see problem based assessment and plan for additional details.  Past Medical History:  Diagnosis Date   Anxiety disorder    At risk for obstructive sleep apnea 04/15/2018   Bipolar 1 disorder (HCC)    COPD (chronic obstructive pulmonary disease) (HCC)    Depression    Hepatitis B immune 04/15/2018   HLD (hyperlipidemia)    Hypertension    Schizophrenia (HCC)     Current Outpatient Medications on File Prior to Visit  Medication Sig Dispense Refill   acetaminophen  (TYLENOL ) 500 MG tablet Take 2 tablets (1,000 mg total) by mouth every 8 (eight) hours as needed for moderate pain (pain score 4-6). DO NOT exceed more than 4,000 mg daily 90 tablet 2   albuterol  (VENTOLIN  HFA) 108 (90 Base) MCG/ACT inhaler Inhale 2 puffs into the lungs every 6 (six) hours as needed for wheezing or shortness of breath. 8 g 4   atorvastatin  (LIPITOR) 40 MG tablet TAKE 1 TABLET (40 MG TOTAL) BY MOUTH DAILY. (BEDTIME) 90 tablet 1   blood glucose meter kit and supplies Dispense based on patient and insurance preference. Use up to four times daily as directed. (FOR ICD-10 E10.9, E11.9). 1 each 0   dicyclomine  (BENTYL ) 20 MG tablet TAKE 1 TABLET BY MOUTH EVERY 6 (SIX) HOURS. (AM+NOON+PM+BEDTIME) 120 tablet 2   famotidine  (PEPCID ) 20 MG tablet TAKE 1 TABLET BY MOUTH 2 (TWO) TIMES DAILY.(AM+PM) 60 tablet 2   fluconazole  (DIFLUCAN ) 200 MG tablet take 2 tablets by mouth on day 1, followed by 1 tablet by mouth once daily for 13 days 15 tablet 0   gabapentin  (NEURONTIN ) 300 MG capsule Take 1 capsule (300 mg total) by mouth 3 (three) times daily. 90 capsule 10   gabapentin  (NEURONTIN ) 600 MG tablet Take 1 tablet (600 mg total) by mouth 3 (three) times daily as needed. 30 tablet 5   hydrOXYzine (ATARAX/VISTARIL) 25 MG tablet Take 25 mg by  mouth 2 (two) times daily as needed.     lamoTRIgine (LAMICTAL) 100 MG tablet Take 100 mg by mouth daily.     metFORMIN  (GLUCOPHAGE ) 1000 MG tablet Take 1 tablet (1,000 mg total) by mouth 2 (two) times daily with a meal. 60 tablet 10   metoprolol  succinate (TOPROL  XL) 25 MG 24 hr tablet Take 1 tablet (25 mg total) by mouth daily. 30 tablet 0   mirtazapine  (REMERON ) 15 MG tablet Take 1 tablet (15 mg total) by mouth at bedtime. 30 tablet 0   nitroGLYCERIN  (NITROSTAT ) 0.4 MG SL tablet Place 1 tablet (0.4 mg total) under the tongue every 5 (five) minutes as needed for chest pain. 100 tablet 3   OZEMPIC , 2 MG/DOSE, 8 MG/3ML SOPN Inject 2 mg into the skin once a week.     pantoprazole  (PROTONIX ) 40 MG tablet TAKE 1 TABLET BY MOUTH 2 (TWO) TIMES DAILY (AM+PM) 90 tablet 3   polyethylene glycol (MIRALAX ) 17 g packet Take 17 g by mouth daily. 30 packet 3   prazosin  (MINIPRESS ) 1 MG capsule Take 1 capsule (1 mg total) by mouth at bedtime. 30 capsule 0   risperiDONE (RISPERDAL) 0.5 MG tablet Take 0.5 mg by mouth at bedtime.     senna (SENOKOT) 8.6 MG TABS tablet Take 1 tablet (8.6 mg total) by mouth daily as needed for mild constipation. 30 tablet 3  sertraline  (ZOLOFT ) 100 MG tablet Take 150 mg by mouth daily.     traMADol  (ULTRAM ) 50 MG tablet Take 1 tablet (50 mg total) by mouth 2 (two) times daily as needed. 10 tablet 0   Current Facility-Administered Medications on File Prior to Visit  Medication Dose Route Frequency Provider Last Rate Last Admin   0.9 %  sodium chloride  infusion  500 mL Intravenous Once Stacia Glendia BRAVO, MD        Family History  Problem Relation Age of Onset   Colon polyps Mother        6 polyps   Diabetes Mother    Hypertension Mother    Glaucoma Mother    Prostate cancer Father 65   Glaucoma Father    Diabetes Sister    Migraines Sister    Anemia Sister    Colon polyps Maternal Uncle        48 polyps   Diabetes Maternal Grandmother    Heart Problems Daughter     Cancer - Other Maternal Cousin 65 - 28       oral   Cancer Maternal Cousin 6 - 16       unknown   Colon cancer Neg Hx    Stomach cancer Neg Hx    Esophageal cancer Neg Hx    Pancreatic cancer Neg Hx     Social History   Socioeconomic History   Marital status: Divorced    Spouse name: Not on file   Number of children: 10   Years of education: Not on file   Highest education level: Not on file  Occupational History   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  Tobacco Use   Smoking status: Former    Current packs/day: 0.00    Types: Cigarettes    Quit date: 05/09/2021    Years since quitting: 2.8   Smokeless tobacco: Never   Tobacco comments:    previous marijuana smoker  Vaping Use   Vaping status: Never Used  Substance and Sexual Activity   Alcohol use: Not Currently   Drug use: Not Currently    Types: Marijuana   Sexual activity: Yes    Partners: Female  Other Topics Concern   Not on file  Social History Narrative   Not on file   Social Drivers of Health   Financial Resource Strain: Not on file  Food Insecurity: No Food Insecurity (02/20/2024)   Hunger Vital Sign    Worried About Running Out of Food in the Last Year: Never true    Ran Out of Food in the Last Year: Never true  Transportation Needs: No Transportation Needs (02/20/2024)   PRAPARE - Administrator, Civil Service (Medical): No    Lack of Transportation (Non-Medical): No  Physical Activity: Not on file  Stress: Not on file  Social Connections: Socially Isolated (02/20/2024)   Social Connection and Isolation Panel    Frequency of Communication with Friends and Family: More than three times a week    Frequency of Social Gatherings with Friends and Family: More than three times a week    Attends Religious Services: Never    Database administrator or Organizations: No    Attends Banker Meetings: Never    Marital Status: Never married  Intimate Partner Violence: Not At Risk  (02/20/2024)   Humiliation, Afraid, Rape, and Kick questionnaire    Fear of Current or Ex-Partner: No    Emotionally Abused: No  Physically Abused: No    Sexually Abused: No    Review of Systems: ROS negative except for what is noted on the assessment and plan.  Vitals:   03/13/24 1040  BP: (!) 128/96  Pulse: (!) 104  Temp: 98.7 F (37.1 C)  TempSrc: Oral  SpO2: 95%  Weight: 263 lb 6.4 oz (119.5 kg)  Height: 6' 5 (1.956 m)    Physical Exam  Physical Exam: Constitutional: well-appearing in no acute distress HENT: normocephalic atraumatic, mucous membranes moist Eyes: conjunctiva non-erythematous Neck: supple Cardiovascular: regular rate and rhythm, no m/r/g Pulmonary/Chest: normal work of breathing on room air, lungs clear to auscultation bilaterally Abdominal: soft, non-tender, non-distended Neurological: alert & oriented x 3, 5/5 strength in bilateral upper and lower extremities, normal gait   Assessment & Plan:   COPD (chronic obstructive pulmonary disease) (HCC) Patient presented for a refill of Anoro Ellipta  for COPD. Currently using Ventolin  about twice a week. Denies shortness of breath, dyspnea on exertion, increased cough, or wheezing.  Lungs clear to auscultation, afebrile.  Plan: Refill Anoro Ellipta  as requested.   Patient seen with Dr. Jeanelle Armando Rossetti M.D [facility], PGY-1 Phone: [phone] Date 03/13/2024 Time 11:21 AM

## 2024-03-14 NOTE — Progress Notes (Signed)
 Internal Medicine Clinic Attending  I was physically present during the key portions of the resident provided service and participated in the medical decision making of patient's management care. I reviewed pertinent patient test results.  The assessment, diagnosis, and plan were formulated together and I agree with the documentation in the resident's note.  Carney Living, MD

## 2024-03-16 MED ORDER — UMECLIDINIUM-VILANTEROL 62.5-25 MCG/ACT IN AEPB: 1.0000 | INHALATION_SPRAY | Freq: Every day | RESPIRATORY_TRACT | 2 refills | Status: DC

## 2024-03-20 ENCOUNTER — Ambulatory Visit: Payer: Self-pay | Admitting: Genetic Counselor

## 2024-03-20 DIAGNOSIS — Z1509 Genetic susceptibility to other malignant neoplasm: Secondary | ICD-10-CM | POA: Insufficient documentation

## 2024-03-20 NOTE — Progress Notes (Signed)
 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 Please refer to the prior Genetics clinic note for more information regarding Bruce [name] medical and family histories and our assessment at the time.   Recommendations discussed by phone on 03/20/24.    FAMILY HISTORY:  We obtained a detailed, 4-generation family history.  Significant diagnoses are listed below: Family History  Problem Relation Age of Onset   Colon polyps Mother        6 polyps   Diabetes Mother    Hypertension Mother    Glaucoma Mother    Prostate cancer Father 20   Glaucoma Father    Diabetes Sister    Migraines Sister    Anemia Sister    Colon polyps Maternal Uncle        48 polyps   Diabetes Maternal Grandmother    Heart Problems Daughter    Cancer - Other Maternal Cousin 42 - 85       oral   Cancer Maternal Cousin 85 - 70       unknown   Colon cancer Neg Hx    Stomach cancer Neg Hx    Esophageal cancer Neg Hx    Pancreatic cancer Neg Hx    Bruce [name] is unaware of previous family history of genetic testing for hereditary cancer risks. There is no reported Ashkenazi Jewish ancestry.    Bruce [name] reports his father was diagnosed with prostate cancer at age 76, living at 58. He has not had a colonoscopy previously. He reports his mother has had 6 polyps identified by colonoscopy and his maternal half sister has had 3 polyps. He reports a maternal uncle with 30 colonic polyps on one colonoscopy. He reports a maternal first cousin diagnosed with an oral cancer in her 9s, deceased. He reports a maternal first cousin diagnosed with an unknown cancer in her 32s, deceased. He reports that he has 10 children,  ages ranging from 3 to 51. His 29 year old daughter has a history of seizures, but his children are otherwise healthy. None of his children have had colonoscopy.       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    The test report will be scanned into EPIC and is located under the Molecular Pathology section of the Results Review tab.  A portion of the result report is included below for reference. Genetic testing reported out on 02/20/24.      Pathogenic and likely pathogenic variants in APC are associated with a hereditary polyposis and cancer syndrome called Familial Adenomatous Polyposis (FAP) as well as an Attenuated Familial Adenomatous Polyposis (AFAP). The particular mutation identified on Bruce [name]'s testing may be associated with both FAP and AFAP; this determination is made based on genotype/phenotype correlations, personal and family. The location of the mutation within the APC gene can affect whether a person has FAP or AFAP, and therefore how likely it is for them to develop certain FAP features. 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    CLINICAL INFORMATION  Familial adenomatous polyposis (FAP) is a genetic condition that is characterized by the presence of greater than 100 adenomatous colon polyps.  A milder form of FAP has been described called attenuated FAP (AFAP).   Familial adenomatous polyposis (FAP) is a genetic condition caused by changes (mutations) in the APC gene that cause a problem with the way the gene is able to work in our bodies. FAP causes hundreds to thousands of polyps to develop in  the colon or other parts of the digestive tract. The polyps typically associated with FAP are called adenomas, or precancerous polyps that can become cancer over time. In classic FAP, polyps usually begin developing in teenagers or young adults. On average, individuals with FAP develop polyps at 46 years of age, with 95% of individuals developing polyps by age 7. Some individuals with FAP might have a milder form of the condition, called attenuated FAP (AFAP). People with AFAP typically have fewer than 100 polyps, which may develop at later ages than classic FAP (onset approximately 46 years of age), presents with fewer adenomatous polyps (<100) that are primarily proximal (right-sided). Without regular screening and removal of polyps, the lifetime risk of colon cancer is up to 70-100%.  FAP also increases the risk of other cancers including stomach, thyroid, brain, pancreas, small bowel, specifically in the duodenum and periampulla. The duodenum is the first part of the small intestine right after the stomach. Periampullary cancer involves other structures near the duodenum. It can also increase the risk of a childhood liver cancer called hepatoblastoma and non-cancerous tumors connective tissue called desmoids.     See chart below for an estimation of current risks per NCCN v1.2025. Risks and recommendations may change as we learn more about APC mutations.   Cancer Type  FAP Lifetime Cancer Risk General Population Lifetime Risk  Colon  Up to 100% (classic FAP); ~70% (attenuated FAP) without treatment  4.1%  Duodenal or periampullary  <1-10% -   Gastric (stomach) 0.1-7.1% 0.8%  Small bowel <1% 0.3%  Thyroid (typically papillary thyroid carcinoma)   1.2-12% 1.2%  Brain/CNS (typically medulloblastoma) 1% 0.6%  Liver (hepatoblastoma) 0.4-2.5% (rare after 46 years of age) -  Intra-abdominal desmoid tumors 10-24% -   People with FAP may also develop extra colonic findings. Such as polyps in the stomach  and small bowel, extra or missing teeth, osteomas, epidermoid cysts, fibromas, and congenital hyperplasia of the retinal pigment epithelium (CHRPE), which is a specific finding that requires a specialized eye examination to identify.    Those with AFAP are less likely to have extra-colonic manifestations related to their APC mutation.    Management Medical management and surveillance protocols have been developed by the [facility] Cancer Network (NCCN) for individuals with FAP and AFAP (National Comprehensive Cancer Network, Genetic/Familial High Risk Assessment: Colorectal, Endometrial, Gastric version 4.2024).  FAP  Colon: Annual sigmoidoscopy/colonoscopy beginning at 42-27 years of age. A colectomy or proctocolectomy is recommended after numerous polyps are detected, due to their high malignant potential.  If patient had colectomy with ileorectal anastomosis (IRA), then endoscopic evaluation of the rectum every 6-12 mo depending on polyp burden. If patient had TPC with ileal pouch-anal anastomosis (IPAA), then endoscopic evaluations of the ileal pouch and rectal cuff annually depending on polyp burden. Surveillance frequency should be shortened to every 6 mo for large, flat  polyps with villous histology and/or high-grade dysplasia identified. If patient had an ileostomy, consider careful visualization and stoma inspection by ileoscopy to evaluate for polyps or malignancy annually; evidence to support this recommendation is limited. Chemoprevention (e.g., sulindac) can aid in management of the remaining rectum; however, there are no medications currently approved by the FDA for this indication.  There are data to suggest that sulindac showed the most significant polyp regression, but it is unclear if the decrease in polyp burden equates to reduction in colorectal cancer risk. Extracolonic: Upper endoscopy beginning at age 40-25 years. Consider upper endoscopy at an earlier age if  colectomy is performed prior to age 22 years.  Consider baseline upper endoscopy earlier, if family history of aggressive duodenal adenoma burden or cancer It is important to note that fundic gland polyps are common in individuals with FAP and while focal low-grade dysplasia can be identified, it is typically non-progressive. High-risk histologic features include tubular adenomas, polyps with high-grade dysplasia, and pyloric gland adenomas. Need for specialized surveillance or surgery should be considered in presence of high-risk histologic features, preferably at a center of expertise. Presence of fundic gland polyps with low-grade dysplasia alone in the absence of high-risk features does not require specialized surveillance. Non-fundic gland polyps should be managed endoscopically if possible. Annual thyroid examination beginning in the late teenage years.   If normal, consider repeating ultrasound every 2-5 y and if abnormal, consider referral to a thyroid specialist. Shorter intervals may be considered for individuals with a family history of thyroid cancer. Patients should be educated regarding signs and symptoms of neurologic cancer and the importance of prompt reporting of abnormal symptoms to their physicians. Suggestive abdominal symptoms should prompt immediate abdominal imaging. If personal history of symptomatic desmoids, consider imaging with abdominal MRI with and without contrast or CT with contrast no less frequently than annually.  For small bowel polyps and cancer,  may consider small bowel visualization (eg, capsule endoscopy), especially if advanced duodenal polyposis. Consider liver palpation, abdominal ultrasound, and measurement of AFP every 3-6 months during the first 5 years of life.  AFAP Colon: Colonoscopy beginning in the late teens, then every 1-2 years. If polyp burden is too great, consider colectomy with ileorectal anastomosis (IRA) or proctocolectomy with ileal  pouch-anal anastomosis (IPAA) if rectal polyposis is not manageable with polypectomy. Following colectomy with IRA, endoscopic evaluation of the remaining rectum is recommended every 6-12 months depending on polyp burden.  Chemoprevention (e.g., sulindac) can aid in management of the remaining rectum; however, there are no medications currently approved by the FDA for this indication.  There are data to suggest that sulindac showed the most significant polyp regression, but it is unclear if the decrease in polyp burden equates to reduction in colorectal cancer risk. Extracolonic: Annual physical examination. Thyroid ultrasound at baseline starting in late teenage years. If normal, consider repeating ultrasound every 2-5 y and if abnormal, consider referral to a thyroid specialist. Shorter intervals may be considered for individuals with a family history of thyroid cancer. Upper endoscopy with side-viewing examination beginning at age 58-25 years. Consider upper endoscopy at an earlier age if colectomy is performed prior to age 66 years. Frequency of upper endoscopy surveillance is dependent on polyp burden.   This information is based on current understanding of the gene and may change in the future.   An individual's cancer risk and medical management are not determined by genetic test results alone. Overall cancer risk assessment incorporates additional factors, including personal medical  history, family history, and any available genetic information that may result in a personalized plan for cancer prevention and surveillance.   Implications for Family Members: Cancer risks associated with FAP are inherited in an autosomal dominant manner. This means that children, siblings and parents of individuals with an APC variant have a 50% (1 in 2) chance of having the variant as well. Both males and females can inherit an APC variant and can pass it on to their children. It is recommended that your family  members have genetic testing to determine if they have the APC mutation and a higher risk for cancer. This could change the recommendations for their healthcare.   Most people with FAP inherit the condition from one of their parents who has a mutation in the APC gene. However, about 20-30% of the time, an individual with FAP is the first person in the family to have the condition (de novo). If the mutation is new, there is a lower chance for siblings to have the mutation. Children would still have a 50% chance to have the mutation.   It is recommended that children have genetic testing by age 64-15 per NCCN, when colon screening would be initiated. However, if families plan to complete hepatoblastoma screening, then FAP testing can be considered in infancy.   For individuals with an identified gene mutation, reproductive options are available including testing on-going pregnancies, testing children when they are of age, or IVF with the option to test embryos for mutations already found in the family. These options may be explored in greater detail with a reproductive or prenatal genetic counselor for more details.    Resources: FORCE (Facing Our Risk of Cancer Empowered) is a resource for those with a hereditary predisposition to develop cancer.  FORCE provides information about risk reduction, advocacy, legislation, and clinical trials.  Additionally, FORCE provides a platform for collaboration and support; which includes: peer navigation, message boards, local support groups, a toll-free helpline, research registry and recruitment, advocate training, published medical research, webinars, brochures, mastectomy photos, and more.  For more information, visit www.facingourrisk.org   Colorectal Cancer Alliance: https://colorectalcancer.org/screening-prevention/prevention/genetic-and-inherited-conditions/fap-syndrome     PLAN:  Follow up recommendations of GI provider, Dr. Stacia.   Discuss options  for thyroid cancer screening with primary care provider. Annual physical exams recommended.   Share results of genetic testing with parents, siblings, and children. Family letter and result sent by mail to help share results. [facility] can be reached at  [phone].   We encouraged Mr. Takeshita to remain in contact with us  on an annual basis so we can update his personal and family histories, and let him know of advances in cancer genetics that may benefit the family. Our contact number was provided. Mr. Dreisbach questions were answered to her satisfaction today, and he knows he is welcome to call anytime with additional questions.      Burnard Ogren, MS, [facility] Licensed, [email or web address] phone: [phone]

## 2024-03-24 ENCOUNTER — Ambulatory Visit: Payer: MEDICAID | Admitting: Physician Assistant

## 2024-03-26 ENCOUNTER — Ambulatory Visit: Payer: MEDICAID | Admitting: Physician Assistant

## 2024-03-27 ENCOUNTER — Ambulatory Visit: Payer: MEDICAID | Admitting: Physician Assistant

## 2024-04-01 ENCOUNTER — Other Ambulatory Visit: Payer: Self-pay | Admitting: Student

## 2024-04-01 ENCOUNTER — Other Ambulatory Visit: Payer: Self-pay | Admitting: Gastroenterology

## 2024-04-01 DIAGNOSIS — I10 Essential (primary) hypertension: Secondary | ICD-10-CM

## 2024-04-01 DIAGNOSIS — M5431 Sciatica, right side: Secondary | ICD-10-CM

## 2024-04-01 DIAGNOSIS — K219 Gastro-esophageal reflux disease without esophagitis: Secondary | ICD-10-CM

## 2024-04-14 ENCOUNTER — Ambulatory Visit: Payer: MEDICAID | Admitting: Physician Assistant

## 2024-04-22 ENCOUNTER — Ambulatory Visit (INDEPENDENT_AMBULATORY_CARE_PROVIDER_SITE_OTHER): Payer: MEDICAID | Admitting: Physician Assistant

## 2024-04-22 DIAGNOSIS — G8929 Other chronic pain: Secondary | ICD-10-CM

## 2024-04-22 DIAGNOSIS — M25511 Pain in right shoulder: Secondary | ICD-10-CM | POA: Diagnosis not present

## 2024-04-22 NOTE — Progress Notes (Signed)
 Office Visit Note   Patient: Bruce [name]           Date of Birth: 06-07-1976           MRN: [id number] Visit Date: 04/22/2024              Requested by: Harrie Bruckner, DO [address] [place],  [place] [address] PCP: Harrie Bruckner, DO   Assessment & Plan: Visit Diagnoses:  1. Chronic right shoulder pain     Plan: Impression is chronic right shoulder pain.  This patient did have good relief during the anesthetic phase of the subacromial cortisone injection but unfortunately this was not long-lasting.  He has also tried a physician guided home exercise program for over 6 weeks without relief.  Due to his worsening pain despite his acceptable range of motion and strength, I would like to go ahead and order an MRI to assess for structural abnormalities.  He will follow-up with us  once this has been completed.  Follow-Up Instructions: Return for f/u after MRI.   Orders:  No orders of the defined types were placed in this encounter.  No orders of the defined types were placed in this encounter.     Procedures: No procedures performed   Clinical Data: No additional findings.   Subjective: Chief Complaint  Patient presents with   Right Shoulder - Pain    HPI patient is a pleasant 48 year old gentleman comes in today with recurrent right shoulder pain.  I saw him back in July of this past year for the same issue.  He had been having symptoms for about 30 years after doing a back flip and twisting his right arm in an awkward way.  He had recently had worsening symptoms at the time of me seeing him.  Most of this was consistent with probable impingement syndrome.  I proceeded with subacromial cortisone injection followed by a physician guided exercise program.  He is here today for follow-up.  He notes that the injection only helped during the anesthetic phase.  He has not had any further relief from the injection or the exercise program.  He continues to  have pain to the proximal deltoid and posterior shoulder.  His pain significantly worsens when he is stretching with his shoulder in a forward flexed position.  He denies any weakness.  He has taken tramadol  which does seem to help at times.  Review of Systems as detailed in HPI.  All others reviewed and are negative.   Objective: Vital Signs: There were no vitals taken for this visit.  Physical Exam well-developed well-nourished gentleman in no acute distress.  Alert and oriented x 3.  Ortho Exam right shoulder exam: Near full active range of motion in all planes.  He does have moderate pain with empty can testing.  No pain with speeds or O'Brien's testing.  No pain with bearhug.  He has full strength throughout.  He is neurovascularly intact distally.  Specialty Comments:  No specialty comments available.  Imaging: No new imaging   PMFS History: Patient Active Problem List   Diagnosis Date Noted   Monoallelic mutation of APC gene 91/98/7974   Scrotal swelling 02/20/2024   Hyperkalemia 12/27/2023   Candidal esophagitis (HCC) 12/27/2023   Personal history of hyperplastic colon polyps 12/26/2023   CKD (chronic kidney disease) stage 3, GFR 30-59 ml/min (HCC) 12/17/2023   Radiculopathy 09/28/2023   Morbid obesity (HCC) 05/22/2023   Snoring 05/02/2023   OSA (obstructive sleep apnea)  03/18/2023   Sciatica 03/13/2023   PTSD (post-traumatic stress disorder) 03/12/2023   Stable angina (HCC) 12/09/2022   Chronic lower back pain 12/07/2022   Pulmonary nodule 09/28/2022   Shoulder pain, right 09/14/2022   Diabetes mellitus due to underlying condition, controlled, with diabetic nephropathy (HCC) 09/14/2022   Healthcare maintenance 08/31/2022   COPD (chronic obstructive pulmonary disease) (HCC) 08/30/2022   Right leg pain 10/20/2021   IBS (irritable bowel syndrome) 05/04/2021   Unexplained night sweats 10/13/2019   Chronic headaches 10/13/2019   Dizziness 10/13/2019   Earlobe lesion,  left 07/15/2019   HLD (hyperlipidemia) 03/12/2019   Abscess of apex of dental root complicating chronic inflammation 03/12/2019   Finger pain, left 01/20/2019   HTN (hypertension) 11/12/2018   Tobacco use disorder 05/20/2018   Schizophrenia (HCC) 04/15/2018   Bipolar disorder (HCC) 04/15/2018   Anxiety and depression 04/15/2018   Gastroesophageal reflux disease 04/15/2018   At risk for obstructive sleep apnea 04/15/2018   Past Medical History:  Diagnosis Date   Anxiety disorder    At risk for obstructive sleep apnea 04/15/2018   Bipolar 1 disorder (HCC)    COPD (chronic obstructive pulmonary disease) (HCC)    Depression    Hepatitis B immune 04/15/2018   HLD (hyperlipidemia)    Hypertension    Schizophrenia (HCC)     Family History  Problem Relation Age of Onset   Colon polyps Mother        6 polyps   Diabetes Mother    Hypertension Mother    Glaucoma Mother    Prostate cancer Father 10   Glaucoma Father    Diabetes Sister    Migraines Sister    Anemia Sister    Colon polyps Maternal Uncle        48 polyps   Diabetes Maternal Grandmother    Heart Problems Daughter    Cancer - Other Maternal Cousin 38 - 72       oral   Cancer Maternal Cousin 56 - 49       unknown   Colon cancer Neg Hx    Stomach cancer Neg Hx    Esophageal cancer Neg Hx    Pancreatic cancer Neg Hx     Past Surgical History:  Procedure Laterality Date   wisdom tooth removal     Social History   Occupational History   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  Tobacco Use   Smoking status: Former    Current packs/day: 0.00    Types: Cigarettes    Quit date: 05/09/2021    Years since quitting: 2.9   Smokeless tobacco: Never   Tobacco comments:    previous marijuana smoker  Vaping Use   Vaping status: Never Used  Substance and Sexual Activity   Alcohol use: Not Currently   Drug use: Not Currently    Types: Marijuana   Sexual activity: Yes    Partners: Female

## 2024-04-22 NOTE — Addendum Note (Signed)
 Addended by: Breck Maryland on: 04/22/2024 10:52 AM   Modules accepted: Orders

## 2024-04-28 ENCOUNTER — Encounter: Payer: Self-pay | Admitting: Orthopaedic Surgery

## 2024-05-01 ENCOUNTER — Other Ambulatory Visit: Payer: MEDICAID

## 2024-05-16 ENCOUNTER — Other Ambulatory Visit: Payer: MEDICAID

## 2024-05-18 ENCOUNTER — Other Ambulatory Visit: Payer: Self-pay | Admitting: Student

## 2024-05-28 ENCOUNTER — Other Ambulatory Visit: Payer: Self-pay | Admitting: Student

## 2024-05-28 DIAGNOSIS — M5431 Sciatica, right side: Secondary | ICD-10-CM

## 2024-06-01 ENCOUNTER — Other Ambulatory Visit: Payer: MEDICAID

## 2024-06-12 ENCOUNTER — Ambulatory Visit
Admission: RE | Admit: 2024-06-12 | Discharge: 2024-06-12 | Disposition: A | Discharge status: Home / Self Care | Payer: MEDICAID | Source: Ambulatory Visit | Attending: Orthopaedic Surgery | Admitting: Orthopaedic Surgery

## 2024-06-12 ENCOUNTER — Other Ambulatory Visit: Payer: Self-pay | Admitting: Orthopaedic Surgery

## 2024-06-12 DIAGNOSIS — G8929 Other chronic pain: Secondary | ICD-10-CM

## 2024-06-15 ENCOUNTER — Telehealth: Payer: Self-pay

## 2024-06-15 NOTE — Telephone Encounter (Signed)
 Prior Authorization for patient (Ozempic  (2 MG/DOSE) 8MG /3ML pen-injectors) came through on cover my meds was submitted with last office notes and labs awaiting approval or denial.  KEY:[id number]

## 2024-06-16 ENCOUNTER — Ambulatory Visit: Payer: Self-pay | Admitting: Student

## 2024-06-16 NOTE — Telephone Encounter (Signed)
 RTC to patient asked patient to also call his Orthopaedic doctor.  Patient was given number to the Orthopaedic office to call.

## 2024-06-16 NOTE — Telephone Encounter (Signed)
 FYI Only or Action Required?: Action required by provider: medication refill request and update on patient condition.  Patient was last seen in primary care on 03/13/2024 by Bernadine Manos, MD.  Called Nurse Triage reporting Pain.  Symptoms began a week ago.  Triage Disposition: See PCP When Office is Open (Within 3 Days)  Patient/caregiver understands and will follow disposition?: Yes                Copied from CRM [id number]. Topic: Clinical - Red Word Triage >> Jun 16, 2024  9:20 AM Bruce [name] wrote: Red Word that prompted transfer to Nurse Triage: patient collar bone on right side hurts pain rate 3 to 6 with movement  Patient stated he did not have any injury patient waiting on mri results for his right shoulder , patient not sure if the pain in collar bone coming from his right shoulder     ----------------------------------------------------------------------- From previous Reason for Contact - Lab/Test Results: Reason for CRM: Reason for Disposition  [1] MODERATE pain (e.g., interferes with normal activities) AND [2] present > 3 days  Answer Assessment - Initial Assessment Questions Pt needs a refill at bottom of this message.  This RN scheduled pt an appointment tomorrow in office. Pt states he is unable to come in today for an appointment. This RN educated pt on new-worsening symptom and when to call back. Pt verbalized understanding and agrees to plan.  Right collar bone and right upper back pain Onset: One week ago Pt states he has chronic right shoulder pain (pt had an MRI on 10/24; pt states he was supposed to get a call with these MRI results); pt is not sure if this is related 3-6/10 pain level constant, hurts 6/10 when wakes up in morning (hurts when leaning over a certain way or reaching behind pt) Denies swelling   WORK OR EXERCISE: Has there been any recent work or exercise that involved this part of the body?     Denies  CAUSE: What do you  think is causing the shoulder pain?     Pt not sure if this is related to his right shoulder injury            Pt needs a refill of Ozempic  sent to below pharmacy:  [facility] [address] [place], [place] [address] [phone]  Protocols used: Shoulder Pain-A-AH

## 2024-06-17 ENCOUNTER — Ambulatory Visit: Payer: MEDICAID | Admitting: Student

## 2024-06-17 VITALS — BP 131/93 | HR 85 | Temp 97.5°F | Ht 77.0 in | Wt 271.0 lb

## 2024-06-17 DIAGNOSIS — M75111 Incomplete rotator cuff tear or rupture of right shoulder, not specified as traumatic: Secondary | ICD-10-CM

## 2024-06-17 DIAGNOSIS — I10 Essential (primary) hypertension: Secondary | ICD-10-CM | POA: Diagnosis not present

## 2024-06-17 DIAGNOSIS — Z23 Encounter for immunization: Secondary | ICD-10-CM | POA: Diagnosis not present

## 2024-06-17 DIAGNOSIS — E1122 Type 2 diabetes mellitus with diabetic chronic kidney disease: Secondary | ICD-10-CM

## 2024-06-17 MED ORDER — LISINOPRIL 20 MG PO TABS
20.0000 mg | ORAL_TABLET | Freq: Every day | ORAL | 3 refills | Status: AC
Start: 2024-06-17 — End: 2025-06-17

## 2024-06-17 NOTE — Progress Notes (Signed)
 CC: Right shoulder pain  HPI:  [name] is a 48 y.o. male living with a history stated below and presents today for right shoulder pain. Please see problem based assessment and plan for additional details.  Discussed the use of AI scribe software for clinical note transcription with the patient, who gave verbal consent to proceed.  History of Present Illness Bruce [name] is a 48 year old male who presents with shoulder pain.  He experiences shoulder pain that radiates to his collarbone and neck, particularly noticeable when lifting his arm or leaning over. The pain is exacerbated by certain movements and when lying on his side. He has a history of shoulder problems and is scheduled to see an orthopedic specialist next week.  He takes multiple medications, approximately ten to fourteen, including lisinopril  for blood pressure and medications for anxiety and bipolar disorder. He is unsure of the names of all his medications. Recently, he took his medication on an empty stomach, which made him feel 'a tiny bit funny'.  During the review of symptoms, he notes that his blood pressure was previously recorded as 118/73. He is unsure if he is currently taking metoprolol , as he does not recognize the name.    Past Medical History:  Diagnosis Date   Anxiety disorder    At risk for obstructive sleep apnea 04/15/2018   Bipolar 1 disorder (HCC)    COPD (chronic obstructive pulmonary disease) (HCC)    Depression    Hepatitis B immune 04/15/2018   HLD (hyperlipidemia)    Hypertension    Schizophrenia (HCC)     Current Outpatient Medications on File Prior to Visit  Medication Sig Dispense Refill   acetaminophen  (TYLENOL ) 500 MG tablet Take 2 tablets (1,000 mg total) by mouth every 8 (eight) hours as needed for moderate pain (pain score 4-6). DO NOT exceed more than 4,000 mg daily 90 tablet 2   albuterol  (VENTOLIN  HFA) 108 (90 Base) MCG/ACT inhaler Inhale 2 puffs into the lungs every  6 (six) hours as needed for wheezing or shortness of breath. 8 g 4   ANORO ELLIPTA  62.5-25 MCG/ACT AEPB INHALE 1 PUFF INTO THE LUNGS DAILY. 60 each 2   atorvastatin  (LIPITOR) 40 MG tablet TAKE 1 TABLET (40 MG TOTAL) BY MOUTH DAILY. (BEDTIME) 90 tablet 1   blood glucose meter kit and supplies Dispense based on patient and insurance preference. Use up to four times daily as directed. (FOR ICD-10 E10.9, E11.9). 1 each 0   dicyclomine  (BENTYL ) 20 MG tablet TAKE 1 TABLET BY MOUTH EVERY 6 (SIX) HOURS. (AM+NOON+PM+BEDTIME) 120 tablet 2   famotidine  (PEPCID ) 20 MG tablet TAKE 1 TABLET BY MOUTH 2 (TWO) TIMES DAILY.(AM+PM) 60 tablet 2   fluconazole  (DIFLUCAN ) 200 MG tablet take 2 tablets by mouth on day 1, followed by 1 tablet by mouth once daily for 13 days 15 tablet 0   gabapentin  (NEURONTIN ) 300 MG capsule Take 1 capsule (300 mg total) by mouth 3 (three) times daily. 90 capsule 10   gabapentin  (NEURONTIN ) 600 MG tablet TAKE 1 TABLET BY MOUTH 3 (THREE) TIMES DAILY AS NEEDED (AM+NOON+BEDTIME) 90 tablet 0   hydrOXYzine (ATARAX/VISTARIL) 25 MG tablet Take 25 mg by mouth 2 (two) times daily as needed.     lamoTRIgine (LAMICTAL) 100 MG tablet Take 100 mg by mouth daily.     metFORMIN  (GLUCOPHAGE ) 1000 MG tablet Take 1 tablet (1,000 mg total) by mouth 2 (two) times daily with a meal. 60 tablet 10   metoprolol   succinate (TOPROL  XL) 25 MG 24 hr tablet Take 1 tablet (25 mg total) by mouth daily. 30 tablet 0   mirtazapine  (REMERON ) 15 MG tablet Take 1 tablet (15 mg total) by mouth at bedtime. 30 tablet 0   nitroGLYCERIN  (NITROSTAT ) 0.4 MG SL tablet Place 1 tablet (0.4 mg total) under the tongue every 5 (five) minutes as needed for chest pain. 100 tablet 3   OZEMPIC , 2 MG/DOSE, 8 MG/3ML SOPN Inject 2 mg into the skin once a week.     pantoprazole  (PROTONIX ) 40 MG tablet TAKE 1 TABLET BY MOUTH 2 (TWO) TIMES DAILY (AM+PM) 90 tablet 3   polyethylene glycol (MIRALAX ) 17 g packet Take 17 g by mouth daily. 30 packet 3    prazosin  (MINIPRESS ) 1 MG capsule Take 1 capsule (1 mg total) by mouth at bedtime. 30 capsule 0   risperiDONE (RISPERDAL) 0.5 MG tablet Take 0.5 mg by mouth at bedtime.     senna (SENOKOT) 8.6 MG TABS tablet Take 1 tablet (8.6 mg total) by mouth daily as needed for mild constipation. 30 tablet 3   sertraline  (ZOLOFT ) 100 MG tablet Take 150 mg by mouth daily.     traMADol  (ULTRAM ) 50 MG tablet Take 1 tablet (50 mg total) by mouth 2 (two) times daily as needed. 10 tablet 0   Current Facility-Administered Medications on File Prior to Visit  Medication Dose Route Frequency Provider Last Rate Last Admin   0.9 %  sodium chloride  infusion  500 mL Intravenous Once Stacia Glendia BRAVO, MD        Family History  Problem Relation Age of Onset   Colon polyps Mother        6 polyps   Diabetes Mother    Hypertension Mother    Glaucoma Mother    Prostate cancer Father 28   Glaucoma Father    Diabetes Sister    Migraines Sister    Anemia Sister    Colon polyps Maternal Uncle        48 polyps   Diabetes Maternal Grandmother    Heart Problems Daughter    Cancer - Other Maternal Cousin 59 - 69       oral   Cancer Maternal Cousin 61 - 55       unknown   Colon cancer Neg Hx    Stomach cancer Neg Hx    Esophageal cancer Neg Hx    Pancreatic cancer Neg Hx     Social History   Socioeconomic History   Marital status: Divorced    Spouse name: Not on file   Number of children: 10   Years of education: Not on file   Highest education level: Not on file  Occupational History   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  Tobacco Use   Smoking status: Former    Current packs/day: 0.00    Types: Cigarettes    Quit date: 05/09/2021    Years since quitting: 3.1   Smokeless tobacco: Never   Tobacco comments:    previous marijuana smoker  Vaping Use   Vaping status: Never Used  Substance and Sexual Activity   Alcohol use: Not Currently   Drug use: Not Currently    Types: Marijuana   Sexual  activity: Yes    Partners: Female  Other Topics Concern   Not on file  Social History Narrative   Not on file   Social Drivers of Health   Financial Resource Strain: Not on file  Food  Insecurity: No Food Insecurity (02/20/2024)   Hunger Vital Sign    Worried About Running Out of Food in the Last Year: Never true    Ran Out of Food in the Last Year: Never true  Transportation Needs: No Transportation Needs (02/20/2024)   PRAPARE - Administrator, Civil Service (Medical): No    Lack of Transportation (Non-Medical): No  Physical Activity: Not on file  Stress: Not on file  Social Connections: Socially Isolated (02/20/2024)   Social Connection and Isolation Panel    Frequency of Communication with Friends and Family: More than three times a week    Frequency of Social Gatherings with Friends and Family: More than three times a week    Attends Religious Services: Never    Database Administrator or Organizations: No    Attends Banker Meetings: Never    Marital Status: Never married  Intimate Partner Violence: Not At Risk (02/20/2024)   Humiliation, Afraid, Rape, and Kick questionnaire    Fear of Current or Ex-Partner: No    Emotionally Abused: No    Physically Abused: No    Sexually Abused: No    Review of Systems: ROS negative except for what is noted on the assessment and plan.  Vitals:   06/17/24 1314 06/17/24 1341  BP: (!) 129/95 (!) 131/93  Pulse: 87 85  Temp: (!) 97.5 F (36.4 C)   TempSrc: Oral   SpO2: 100%   Weight: 271 lb (122.9 kg)   Height: 6' 5 (1.956 m)     Physical Exam: Constitutional: well-appearing male in no acute distress Neck: supple Cardiovascular: regular rate and rhythm, no m/r/g Pulmonary/Chest: normal work of breathing on room air, lungs clear to auscultation bilaterally MSK: normal bulk and tone, limited abduction of right arm   Assessment & Plan:   HTN (hypertension) Essential hypertension Suboptimal blood pressure  control with elevated diastolic pressure. Uncertainty about metoprolol  adherence due to prescription expiration. - Recheck blood pressure during the visit. - Review and update prescription for metoprolol  if necessary. - Schedule follow-up appointment in one week to review all medications and ensure proper management. - Refilled his lisinopril   Partial nontraumatic tear of rotator cuff, right Pain radiates to collarbone and neck, worsened by movement. Swelling suggests partial rotator cuff tear. Orthopedic evaluation scheduled. - Will be seen by orthopedic specialist for evaluation and management of rotator cuff tear.  Encourage patient to bring all of his medications to his appointment next week for thorough medication reconciliation.   Patient discussed with Dr. Winfrey    Bruce [name], M.D. [facility], PGY-3 Pager: [phone] Date 06/17/2024 Time 2:37 PM

## 2024-06-17 NOTE — Telephone Encounter (Signed)
 Quintin Lager (Key: [id number]) Rx #: [id number] Ozempic  (2 MG/DOSE) 8MG JONELLE pen-injectors Form [employer] Electronic Prior Authorization Form Created 1 day ago Sent to Plan 1 day ago Plan Response 1 day ago Submit Clinical Questions 1 day ago Determination Favorable 1 day ago Message from Plan Approved. OZEMPIC  (2 MG/DOSE) 8MG /3ML Soln Pen-inj is approved from 06/15/2024 to 06/15/2025. All strengths of the drug are approved.. Authorization Expiration Date: June 15, 2025.  Patient is aware of approval.

## 2024-06-17 NOTE — Progress Notes (Signed)
 Internal Medicine Clinic Attending  Case discussed with the resident at the time of the visit.  We reviewed the resident's history and exam and pertinent patient test results.  I agree with the assessment, diagnosis, and plan of care documented in the resident's note.

## 2024-06-17 NOTE — Assessment & Plan Note (Signed)
 Pain radiates to collarbone and neck, worsened by movement. Swelling suggests partial rotator cuff tear. Orthopedic evaluation scheduled. - Will be seen by orthopedic specialist for evaluation and management of rotator cuff tear.

## 2024-06-17 NOTE — Patient Instructions (Addendum)
 Thank you so much for coming to the clinic today!   I have refilled your lisinopril   I'd like to see you back after your ortho appointment next week! Please bring in all of your medications  If you have any questions please feel free to the call the clinic at anytime at [phone]. It was a pleasure seeing you!  Best, Dr. Keyandra Swenson

## 2024-06-17 NOTE — Assessment & Plan Note (Signed)
 Essential hypertension Suboptimal blood pressure control with elevated diastolic pressure. Uncertainty about metoprolol  adherence due to prescription expiration. - Recheck blood pressure during the visit. - Review and update prescription for metoprolol  if necessary. - Schedule follow-up appointment in one week to review all medications and ensure proper management. - Refilled his lisinopril 

## 2024-06-22 ENCOUNTER — Encounter: Payer: Self-pay | Admitting: Radiology

## 2024-06-23 ENCOUNTER — Ambulatory Visit (INDEPENDENT_AMBULATORY_CARE_PROVIDER_SITE_OTHER): Payer: MEDICAID | Admitting: Orthopaedic Surgery

## 2024-06-23 DIAGNOSIS — M25511 Pain in right shoulder: Secondary | ICD-10-CM | POA: Diagnosis not present

## 2024-06-23 DIAGNOSIS — M19011 Primary osteoarthritis, right shoulder: Secondary | ICD-10-CM

## 2024-06-23 DIAGNOSIS — G8929 Other chronic pain: Secondary | ICD-10-CM | POA: Diagnosis not present

## 2024-06-23 MED ORDER — METHYLPREDNISOLONE ACETATE 40 MG/ML IJ SUSP
13.3300 mg | INTRAMUSCULAR | Status: AC | PRN
  Administered 2024-06-23: 13.33 mg via INTRA_ARTICULAR

## 2024-06-23 MED ORDER — LIDOCAINE HCL 1 % IJ SOLN
1.0000 mL | INTRAMUSCULAR | Status: AC | PRN
  Administered 2024-06-23: 1 mL

## 2024-06-23 NOTE — Progress Notes (Signed)
 Office Visit Note   Patient: Bruce [name]           Date of Birth: [birth year]           MRN: [id number] Visit Date: 06/23/2024              Requested by: Harrie Bruckner, DO [address], [address] [place],  [place] [address] PCP: Harrie Bruckner, DO   Assessment & Plan: Visit Diagnoses:  1. Arthritis of right acromioclavicular joint   2. Chronic right shoulder pain     Plan: History of Present Illness Bruce [name] is a 48 year old male with shoulder arthritis who presents with shoulder pain.  He experiences severe anterior shoulder pain, occasionally radiating upwards. A subacromial injection in July provided some relief. MRI shows arthritis and tendinosis of the rotator cuff with partial tearing, most significant in the [facility] joint, along with swelling. The pain significantly impacts his [name] activities, particularly in caring for his young children, including twins aged four and a three-year-old, making it difficult to rest or limit activities.  Physical Exam MUSCULOSKELETAL: Tenderness in the anterior shoulder over AC joint.  Rotator cuff normal to MMT.  Results RADIOLOGY Shoulder MRI: Findings consistent with arthritis, tendinosis of the rotator cuff, partial tearing, and acromioclavicular (AC) joint arthritis with effusion.  Assessment and Plan Right shoulder acromioclavicular joint arthritis Chronic arthritis with significant pain and swelling. Previous subacromial injection provided some relief. He prefers to avoid surgery due to caregiving responsibilities. - Administered cortisone injection in the right shoulder acromioclavicular joint.  Only able to tolerate 1 cc of the injection before aborting.    Right shoulder rotator cuff tendinosis with partial tearing Partial tearing of rotator cuff tendons with tendinosis. No full tears on MRI. Pain present but secondary to [facility] joint arthritis.  Follow-Up Instructions: No follow-ups on file.   Orders:  Orders  Placed This Encounter  Procedures   Medium Joint Inj   No orders of the defined types were placed in this encounter.     Procedures: Medium Joint Inj: R acromioclavicular on 06/23/2024 10:44 AM Indications: pain Details: 25 G needle Medications: 1 mL lidocaine  1 %; 13.33 mg methylPREDNISolone  acetate 40 MG/ML Outcome: tolerated well, no immediate complications      Clinical Data: No additional findings.   Subjective: Chief Complaint  Patient presents with   Right Shoulder - Pain    HPI  Review of Systems  Constitutional: Negative.   HENT: Negative.    Eyes: Negative.   Respiratory: Negative.    Cardiovascular: Negative.   Gastrointestinal: Negative.   Endocrine: Negative.   Genitourinary: Negative.   Skin: Negative.   Allergic/Immunologic: Negative.   Neurological: Negative.   Hematological: Negative.   Psychiatric/Behavioral: Negative.    All other systems reviewed and are negative.    Objective: Vital Signs: There were no vitals taken for this visit.  Physical Exam Vitals and nursing note reviewed.  Constitutional:      Appearance: He is well-developed.  HENT:     Head: Normocephalic and atraumatic.  Eyes:     Pupils: Pupils are equal, round, and reactive to light.  Pulmonary:     Effort: Pulmonary effort is normal.  Abdominal:     Palpations: Abdomen is soft.  Musculoskeletal:        General: Normal range of motion.     Cervical back: Neck supple.  Skin:    General: Skin is warm.  Neurological:     Mental Status: He is  alert and oriented to person, place, and time.  Psychiatric:        Behavior: Behavior normal.        Thought Content: Thought content normal.        Judgment: Judgment normal.

## 2024-06-25 ENCOUNTER — Other Ambulatory Visit: Payer: Self-pay | Admitting: Student

## 2024-06-25 DIAGNOSIS — K219 Gastro-esophageal reflux disease without esophagitis: Secondary | ICD-10-CM

## 2024-06-25 DIAGNOSIS — M5431 Sciatica, right side: Secondary | ICD-10-CM

## 2024-06-25 NOTE — Telephone Encounter (Signed)
 Medication sent to pharmacy

## 2024-07-01 ENCOUNTER — Ambulatory Visit: Payer: MEDICAID | Admitting: Student

## 2024-07-01 VITALS — BP 123/87 | HR 98 | Temp 97.9°F | Ht 77.0 in | Wt 275.6 lb

## 2024-07-01 DIAGNOSIS — Z87891 Personal history of nicotine dependence: Secondary | ICD-10-CM

## 2024-07-01 DIAGNOSIS — Z1509 Genetic susceptibility to other malignant neoplasm: Secondary | ICD-10-CM

## 2024-07-01 DIAGNOSIS — B3781 Candidal esophagitis: Secondary | ICD-10-CM

## 2024-07-01 DIAGNOSIS — F4310 Post-traumatic stress disorder, unspecified: Secondary | ICD-10-CM

## 2024-07-01 DIAGNOSIS — J449 Chronic obstructive pulmonary disease, unspecified: Secondary | ICD-10-CM

## 2024-07-01 DIAGNOSIS — M75111 Incomplete rotator cuff tear or rupture of right shoulder, not specified as traumatic: Secondary | ICD-10-CM | POA: Diagnosis not present

## 2024-07-01 DIAGNOSIS — R911 Solitary pulmonary nodule: Secondary | ICD-10-CM | POA: Diagnosis not present

## 2024-07-01 DIAGNOSIS — E0821 Diabetes mellitus due to underlying condition with diabetic nephropathy: Secondary | ICD-10-CM

## 2024-07-01 DIAGNOSIS — E785 Hyperlipidemia, unspecified: Secondary | ICD-10-CM

## 2024-07-01 DIAGNOSIS — I10 Essential (primary) hypertension: Secondary | ICD-10-CM

## 2024-07-01 DIAGNOSIS — Z8249 Family history of ischemic heart disease and other diseases of the circulatory system: Secondary | ICD-10-CM

## 2024-07-01 DIAGNOSIS — Z1589 Genetic susceptibility to other disease: Secondary | ICD-10-CM

## 2024-07-01 DIAGNOSIS — Z833 Family history of diabetes mellitus: Secondary | ICD-10-CM

## 2024-07-01 DIAGNOSIS — Z79899 Other long term (current) drug therapy: Secondary | ICD-10-CM

## 2024-07-01 DIAGNOSIS — Z7951 Long term (current) use of inhaled steroids: Secondary | ICD-10-CM

## 2024-07-01 DIAGNOSIS — K581 Irritable bowel syndrome with constipation: Secondary | ICD-10-CM

## 2024-07-01 NOTE — Patient Instructions (Addendum)
 Thank you so much for coming to the clinic today!   No changes today, and I have updated your list in our system as well.  I have also placed an order for that CT scan of your chest  If you have any questions please feel free to the call the clinic at anytime at [phone]. It was a pleasure seeing you!  Best, Dr. Arlyss Weathersby

## 2024-07-02 NOTE — Assessment & Plan Note (Addendum)
 Managed with bupropion, Lamictal, prazosin , Zoloft , and ripseridone.Recent positive feedback on medication efficacy.  Managed by psychiatry. - Continue current psychiatric medication regimen.

## 2024-07-02 NOTE — Progress Notes (Signed)
 CC: Medication reconciliation  HPI:  [name] is a 48 y.o. male living with a history stated below and presents today for medication reconciliation. Please see problem based assessment and plan for additional details.  Discussed the use of AI scribe software for clinical note transcription with the patient, who gave verbal consent to proceed.  History of Present Illness Bruce [name] is a 48 year old male with arthritis and rotator cuff tear who presents with shoulder pain.  He experiences shoulder pain radiating from his shoulder to his collarbone and up to the side of his neck. The pain affects his ability to move his arm without discomfort. He has been evaluated by an orthopedic doctor and was diagnosed with arthritis in the shoulder and torn ligaments in the rotator cuff. He was offered a cortisone injection for his shoulder pain but declined it due to concerns about pain.  He is concerned that his diabetes may affect his healing process if he undergoes surgery, which could require two months of recovery in a sling. He has a history of diabetes, hyperlipidemia, and hypertension. He is currently taking atorvastatin  for cholesterol, bupropion for mental health, dicyclomine  for IBS, gabapentin  for leg pain, and Ozempic  for diabetes. His blood pressure is well-controlled with medication.  He has a history of IBS and underwent a colonoscopy earlier this year, which revealed 48 polyps, some of which were removed. He plans to call and check about the timing for his next follow-up appointment due to the presence of remaining polyps. He has a family history of similar gastrointestinal issues, as his mother advised him to get checked due to the condition running in the family.  He has a history of mental health issues, including PTSD and bipolar disorder, and is under the care of a psychiatrist. His current medication regimen has been effective in managing his symptoms.    Past Medical  History:  Diagnosis Date   Anxiety disorder    At risk for obstructive sleep apnea 04/15/2018   Bipolar 1 disorder (HCC)    COPD (chronic obstructive pulmonary disease) (HCC)    Depression    Hepatitis B immune 04/15/2018   HLD (hyperlipidemia)    Hypertension    Schizophrenia (HCC)     Current Outpatient Medications on File Prior to Visit  Medication Sig Dispense Refill   acetaminophen  (TYLENOL ) 500 MG tablet Take 2 tablets (1,000 mg total) by mouth every 8 (eight) hours as needed for moderate pain (pain score 4-6). DO NOT exceed more than 4,000 mg daily 90 tablet 2   albuterol  (VENTOLIN  HFA) 108 (90 Base) MCG/ACT inhaler Inhale 2 puffs into the lungs every 6 (six) hours as needed for wheezing or shortness of breath. 8 g 4   ANORO ELLIPTA  62.5-25 MCG/ACT AEPB INHALE 1 PUFF INTO THE LUNGS DAILY. 60 each 2   atorvastatin  (LIPITOR) 40 MG tablet TAKE 1 TABLET (40 MG TOTAL) BY MOUTH DAILY. (BEDTIME) 90 tablet 1   blood glucose meter kit and supplies Dispense based on patient and insurance preference. Use up to four times daily as directed. (FOR ICD-10 E10.9, E11.9). 1 each 0   dicyclomine  (BENTYL ) 20 MG tablet TAKE 1 TABLET BY MOUTH EVERY 6 (SIX) HOURS. (AM+NOON+PM+BEDTIME) 120 tablet 2   famotidine  (PEPCID ) 20 MG tablet TAKE 1 TABLET BY MOUTH 2 (TWO) TIMES DAILY.(AM+PM) 60 tablet 2   gabapentin  (NEURONTIN ) 600 MG tablet TAKE 1 TABLET BY MOUTH 3 (THREE) TIMES DAILY AS NEEDED (AM+NOON+BEDTIME) 90 tablet 0   hydrOXYzine (ATARAX/VISTARIL)  25 MG tablet Take 25 mg by mouth 2 (two) times daily as needed.     lamoTRIgine (LAMICTAL) 100 MG tablet Take 100 mg by mouth daily.     lisinopril  (ZESTRIL ) 20 MG tablet Take 1 tablet (20 mg total) by mouth daily. 90 tablet 3   metFORMIN  (GLUCOPHAGE ) 1000 MG tablet TAKE 1 TABLET BY MOUTH 2 (TWO) TIMES DAILY WITH A MEAL. (AM+PM) 60 tablet 10   OZEMPIC , 2 MG/DOSE, 8 MG/3ML SOPN Inject 2 mg into the skin once a week.     pantoprazole  (PROTONIX ) 40 MG tablet TAKE  1 TABLET BY MOUTH 2 (TWO) TIMES DAILY (AM+PM) 90 tablet 3   polyethylene glycol (MIRALAX ) 17 g packet Take 17 g by mouth daily. 30 packet 3   prazosin  (MINIPRESS ) 1 MG capsule Take 1 capsule (1 mg total) by mouth at bedtime. 30 capsule 0   risperiDONE (RISPERDAL) 0.5 MG tablet Take 0.5 mg by mouth at bedtime.     senna (SENOKOT) 8.6 MG TABS tablet Take 1 tablet (8.6 mg total) by mouth daily as needed for mild constipation. 30 tablet 3   sertraline  (ZOLOFT ) 100 MG tablet Take 150 mg by mouth daily.     No current facility-administered medications on file prior to visit.    Family History  Problem Relation Age of Onset   Colon polyps Mother        6 polyps   Diabetes Mother    Hypertension Mother    Glaucoma Mother    Prostate cancer Father 1   Glaucoma Father    Diabetes Sister    Migraines Sister    Anemia Sister    Colon polyps Maternal Uncle        48 polyps   Diabetes Maternal Grandmother    Heart Problems Daughter    Cancer - Other Maternal Cousin 101 - 60       oral   Cancer Maternal Cousin 31 - 78       unknown   Colon cancer Neg Hx    Stomach cancer Neg Hx    Esophageal cancer Neg Hx    Pancreatic cancer Neg Hx     Social History   Socioeconomic History   Marital status: Divorced    Spouse name: Not on file   Number of children: 10   Years of education: Not on file   Highest education level: Not on file  Occupational History   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  Tobacco Use   Smoking status: Former    Current packs/day: 0.00    Types: Cigarettes    Quit date: 05/09/2021    Years since quitting: 3.1   Smokeless tobacco: Never   Tobacco comments:    previous marijuana smoker  Vaping Use   Vaping status: Never Used  Substance and Sexual Activity   Alcohol use: Not Currently   Drug use: Not Currently    Types: Marijuana   Sexual activity: Yes    Partners: Female  Other Topics Concern   Not on file  Social History Narrative   Not on file    Social Drivers of Health   Financial Resource Strain: Not on file  Food Insecurity: No Food Insecurity (02/20/2024)   Hunger Vital Sign    Worried About Running Out of Food in the Last Year: Never true    Ran Out of Food in the Last Year: Never true  Transportation Needs: No Transportation Needs (02/20/2024)   PRAPARE - Transportation  Lack of Transportation (Medical): No    Lack of Transportation (Non-Medical): No  Physical Activity: Not on file  Stress: Not on file  Social Connections: Socially Isolated (02/20/2024)   Social Connection and Isolation Panel    Frequency of Communication with Friends and Family: More than three times a week    Frequency of Social Gatherings with Friends and Family: More than three times a week    Attends Religious Services: Never    Database Administrator or Organizations: No    Attends Banker Meetings: Never    Marital Status: Never married  Intimate Partner Violence: Not At Risk (02/20/2024)   Humiliation, Afraid, Rape, and Kick questionnaire    Fear of Current or Ex-Partner: No    Emotionally Abused: No    Physically Abused: No    Sexually Abused: No    Review of Systems: ROS negative except for what is noted on the assessment and plan.  Vitals:   07/01/24 1314  BP: 123/87  Pulse: 98  Temp: 97.9 F (36.6 C)  TempSrc: Oral  SpO2: 94%  Weight: 275 lb 9.6 oz (125 kg)  Height: 6' 5 (1.956 m)    Physical Exam: Constitutional: well-appearing male in no acute distress Cardiovascular: regular rate and rhythm, no m/r/g Pulmonary/Chest: normal work of breathing on room air, lungs clear to auscultation bilaterally   Assessment & Plan:   Partial nontraumatic tear of rotator cuff, right Chronic right shoulder pain likely due to rotator cuff tear and osteoarthritis. Previous cortisone injection was painful. Surgical intervention discussed but deferred due to recovery concerns and diabetes. - Schedule follow-up with orthopedic  doctor to discuss management options, including surgery or cortisone injection.  Diabetes mellitus due to underlying condition, controlled, with diabetic nephropathy (HCC) Managed with metformin . Recent issues with Ozempic  approval noted.  HTN (hypertension) Blood pressure well-controlled with current medication of lisinopril  20mg . - Continue current antihypertensive regimen.  IBS (irritable bowel syndrome) Managed with dicyclomine . Needs follow-up with gastroenterologist ongoing. - Continue dicyclomine  for IBS management. - Discuss IBS management with gastroenterologist during next visit.  Monoallelic mutation of APC gene Post-polypectomy surveillance ongoing. Recent colonoscopy showed remaining polyps. Recommendation is colonoscopy yearly, due in April 2026.  - Follow up with gastroenterologist for surveillance colonoscopy as recommended.  PTSD (post-traumatic stress disorder) Managed with bupropion, Lamictal, prazosin , Zoloft , and ripseridone.Recent positive feedback on medication efficacy.  Managed by psychiatry. - Continue current psychiatric medication regimen.  Candidal esophagitis (HCC) Finishes course of Diflucan , continuing his pantoprazole  40 mg twice a day, and Pepcid  per GI.  COPD (chronic obstructive pulmonary disease) (HCC) Using his an Anoro Ellipta  without concern.  Will continue  HLD (hyperlipidemia) On atorvastatin  40 mg, due for repeat lipid panel at his next visit.  Pulmonary nodule Recommended follow-up CT in December 2025, placed order for this.     Patient discussed with Dr. Trudy Dirks Nieves Barberi, M.D. [facility], PGY-3 Pager: [phone] Date 07/02/2024 Time 9:27 AM

## 2024-07-02 NOTE — Assessment & Plan Note (Signed)
 Chronic right shoulder pain likely due to rotator cuff tear and osteoarthritis. Previous cortisone injection was painful. Surgical intervention discussed but deferred due to recovery concerns and diabetes. - Schedule follow-up with orthopedic doctor to discuss management options, including surgery or cortisone injection.

## 2024-07-02 NOTE — Assessment & Plan Note (Signed)
 Blood pressure well-controlled with current medication of lisinopril  20mg . - Continue current antihypertensive regimen.

## 2024-07-02 NOTE — Assessment & Plan Note (Signed)
 Managed with metformin . Recent issues with Ozempic  approval noted.

## 2024-07-02 NOTE — Assessment & Plan Note (Signed)
 Post-polypectomy surveillance ongoing. Recent colonoscopy showed remaining polyps. Recommendation is colonoscopy yearly, due in April 2026.  - Follow up with gastroenterologist for surveillance colonoscopy as recommended.

## 2024-07-02 NOTE — Assessment & Plan Note (Signed)
 Managed with dicyclomine . Needs follow-up with gastroenterologist ongoing. - Continue dicyclomine  for IBS management. - Discuss IBS management with gastroenterologist during next visit.

## 2024-07-02 NOTE — Assessment & Plan Note (Signed)
 Recommended follow-up CT in December 2025, placed order for this.

## 2024-07-02 NOTE — Assessment & Plan Note (Signed)
 On atorvastatin  40 mg, due for repeat lipid panel at his next visit.

## 2024-07-02 NOTE — Assessment & Plan Note (Signed)
 Using his an Anoro Ellipta  without concern.  Will continue

## 2024-07-02 NOTE — Assessment & Plan Note (Signed)
 Finishes course of Diflucan , continuing his pantoprazole  40 mg twice a day, and Pepcid  per GI.

## 2024-07-07 ENCOUNTER — Ambulatory Visit: Payer: MEDICAID | Admitting: Orthopaedic Surgery

## 2024-07-13 ENCOUNTER — Other Ambulatory Visit: Payer: Self-pay | Admitting: Student

## 2024-07-13 MED ORDER — OZEMPIC (2 MG/DOSE) 8 MG/3ML ~~LOC~~ SOPN: 2.0000 mg | PEN_INJECTOR | SUBCUTANEOUS | 5 refills | Status: AC

## 2024-07-13 NOTE — Telephone Encounter (Signed)
 Copied from CRM [id number]. Topic: Clinical - Medication Refill >> Jul 13, 2024 11:51 AM Graeme ORN wrote: Medication:   OZEMPIC , 2 MG/DOSE, 8 MG/3ML SOPN   Has the patient contacted their pharmacy? Yes (Agent: If no, request that the patient contact the pharmacy for the refill. If patient does not wish to contact the pharmacy document the reason why and proceed with request.) (Agent: If yes, when and what did the pharmacy advise?)have provider send new onw  This is the patient's preferred pharmacy:  [facility] - [place], [place] - [address] [place] [address] Phone: [phone] Fax: [phone]   Is this the correct pharmacy for this prescription? Yes If no, delete pharmacy and type the correct one.   Has the prescription been filled recently? No  Is the patient out of the medication? Yes -   Has the patient been seen for an appointment in the last year OR does the patient have an upcoming appointment? Yes  Can we respond through MyChart? No  Agent: Please be advised that Rx refills may take up to 3 business days. We ask that you follow-up with your pharmacy.

## 2024-07-14 NOTE — Progress Notes (Signed)
 [facility] Attending  Case discussed with the resident at the time of the visit.  We reviewed the resident's history and exam (though shoulder exam not done) and pertinent patient test results.  I agree with the assessment, diagnosis, and plan of care documented in the resident's note.

## 2024-07-24 ENCOUNTER — Other Ambulatory Visit: Payer: Self-pay | Admitting: Student

## 2024-07-24 DIAGNOSIS — M5431 Sciatica, right side: Secondary | ICD-10-CM

## 2024-08-21 ENCOUNTER — Other Ambulatory Visit: Payer: Self-pay | Admitting: Student

## 2024-08-21 DIAGNOSIS — E785 Hyperlipidemia, unspecified: Secondary | ICD-10-CM

## 2024-08-21 DIAGNOSIS — M5431 Sciatica, right side: Secondary | ICD-10-CM

## 2024-08-24 MED ORDER — DICYCLOMINE HCL 20 MG PO TABS: 20.0000 mg | ORAL_TABLET | Freq: Three times a day (TID) | ORAL | 2 refills | Status: AC

## 2024-09-18 NOTE — Addendum Note (Signed)
 Addended by: KEM NA on: 09/18/2024 11:45 AM   Modules accepted: Orders

## 2024-09-21 ENCOUNTER — Other Ambulatory Visit: Payer: Self-pay | Admitting: Student

## 2024-09-21 ENCOUNTER — Other Ambulatory Visit: Payer: Self-pay | Admitting: Gastroenterology

## 2024-09-21 DIAGNOSIS — K219 Gastro-esophageal reflux disease without esophagitis: Secondary | ICD-10-CM

## 2024-09-22 NOTE — Telephone Encounter (Signed)
 Medication sent to pharmacy
